# Patient Record
Sex: Female | Born: 1940
Health system: Southern US, Community
[De-identification: ages and names within clinical notes are randomized; demographics above are authoritative.]

## PROBLEM LIST (undated history)

## (undated) DIAGNOSIS — M542 Cervicalgia: Secondary | ICD-10-CM

## (undated) DIAGNOSIS — I10 Essential (primary) hypertension: Secondary | ICD-10-CM

## (undated) DIAGNOSIS — M159 Polyosteoarthritis, unspecified: Secondary | ICD-10-CM

## (undated) DIAGNOSIS — R251 Tremor, unspecified: Secondary | ICD-10-CM

## (undated) DIAGNOSIS — E785 Hyperlipidemia, unspecified: Secondary | ICD-10-CM

## (undated) DIAGNOSIS — K219 Gastro-esophageal reflux disease without esophagitis: Secondary | ICD-10-CM

## (undated) DIAGNOSIS — R51 Headache: Secondary | ICD-10-CM

## (undated) DIAGNOSIS — F419 Anxiety disorder, unspecified: Secondary | ICD-10-CM

## (undated) DIAGNOSIS — K5790 Diverticulosis of intestine, part unspecified, without perforation or abscess without bleeding: Secondary | ICD-10-CM

## (undated) DIAGNOSIS — R519 Headache, unspecified: Secondary | ICD-10-CM

## (undated) HISTORY — PX: OTHER SURGICAL HISTORY: SHX169

## (undated) HISTORY — PX: KNEE ARTHROSCOPY: SUR90

---

## 2004-12-29 ENCOUNTER — Ambulatory Visit: Payer: Self-pay | Admitting: Internal Medicine

## 2005-02-01 ENCOUNTER — Ambulatory Visit: Payer: Self-pay | Admitting: Internal Medicine

## 2005-10-11 ENCOUNTER — Ambulatory Visit: Payer: Self-pay | Admitting: Gastroenterology

## 2006-02-12 ENCOUNTER — Ambulatory Visit: Payer: Self-pay | Admitting: Internal Medicine

## 2007-02-19 ENCOUNTER — Ambulatory Visit: Payer: Self-pay | Admitting: Internal Medicine

## 2007-11-04 ENCOUNTER — Ambulatory Visit: Payer: Self-pay

## 2007-11-10 ENCOUNTER — Other Ambulatory Visit: Payer: Self-pay

## 2007-11-10 ENCOUNTER — Ambulatory Visit: Payer: Self-pay | Admitting: Unknown Physician Specialty

## 2007-11-11 ENCOUNTER — Ambulatory Visit: Payer: Self-pay | Admitting: Unknown Physician Specialty

## 2007-12-08 ENCOUNTER — Ambulatory Visit: Payer: Self-pay | Admitting: Cardiology

## 2008-04-11 ENCOUNTER — Other Ambulatory Visit: Payer: Self-pay

## 2008-04-11 ENCOUNTER — Emergency Department: Payer: Self-pay | Admitting: Emergency Medicine

## 2009-02-24 ENCOUNTER — Ambulatory Visit: Payer: Self-pay | Admitting: Internal Medicine

## 2009-06-17 ENCOUNTER — Inpatient Hospital Stay: Payer: Self-pay | Admitting: Internal Medicine

## 2010-03-27 ENCOUNTER — Ambulatory Visit: Payer: Self-pay | Admitting: Internal Medicine

## 2010-07-02 ENCOUNTER — Inpatient Hospital Stay: Payer: Self-pay | Admitting: Internal Medicine

## 2010-11-14 ENCOUNTER — Ambulatory Visit: Payer: Self-pay | Admitting: Internal Medicine

## 2011-01-09 ENCOUNTER — Ambulatory Visit: Payer: Self-pay | Admitting: Gastroenterology

## 2011-01-11 LAB — PATHOLOGY REPORT

## 2011-01-17 ENCOUNTER — Ambulatory Visit: Payer: Self-pay | Admitting: Gastroenterology

## 2011-11-20 ENCOUNTER — Ambulatory Visit: Payer: Self-pay | Admitting: Internal Medicine

## 2012-11-26 ENCOUNTER — Ambulatory Visit: Payer: Self-pay | Admitting: Internal Medicine

## 2013-03-06 ENCOUNTER — Emergency Department: Payer: Self-pay | Admitting: Emergency Medicine

## 2013-03-06 LAB — URINALYSIS, COMPLETE
Bilirubin,UR: NEGATIVE
Glucose,UR: NEGATIVE mg/dL (ref 0–75)
Ketone: NEGATIVE
Ph: 7 (ref 4.5–8.0)
Protein: NEGATIVE
RBC,UR: 4 /HPF (ref 0–5)
Specific Gravity: 1.01 (ref 1.003–1.030)
Squamous Epithelial: 19
WBC UR: 144 /HPF (ref 0–5)

## 2013-03-06 LAB — COMPREHENSIVE METABOLIC PANEL
Anion Gap: 9 (ref 7–16)
BUN: 14 mg/dL (ref 7–18)
Bilirubin,Total: 0.8 mg/dL (ref 0.2–1.0)
Calcium, Total: 9.4 mg/dL (ref 8.5–10.1)
Chloride: 106 mmol/L (ref 98–107)
Co2: 22 mmol/L (ref 21–32)
EGFR (African American): >60
EGFR (Non-African Amer.): 56 — ABNORMAL LOW
Glucose: 111 mg/dL — ABNORMAL HIGH (ref 65–99)
Osmolality: 275 (ref 275–301)
SGOT(AST): 38 U/L — ABNORMAL HIGH (ref 15–37)
Sodium: 137 mmol/L (ref 136–145)
Total Protein: 7.5 g/dL (ref 6.4–8.2)

## 2013-03-06 LAB — CBC
MCHC: 35 g/dL (ref 32.0–36.0)
RDW: 13 % (ref 11.5–14.5)
WBC: 7.9 10*3/uL (ref 3.6–11.0)

## 2013-03-06 LAB — LIPASE, BLOOD: Lipase: 183 U/L (ref 73–393)

## 2013-03-07 ENCOUNTER — Emergency Department: Payer: Self-pay | Admitting: Internal Medicine

## 2013-03-07 LAB — CBC
HCT: 45.2 % (ref 35.0–47.0)
MCHC: 34.9 g/dL (ref 32.0–36.0)
MCV: 90 fL (ref 80–100)
RBC: 5.04 10*6/uL (ref 3.80–5.20)
WBC: 7.5 10*3/uL (ref 3.6–11.0)

## 2013-03-07 LAB — URINALYSIS, COMPLETE
Bilirubin,UR: NEGATIVE
Glucose,UR: NEGATIVE mg/dL (ref 0–75)
Hyaline Cast: 4
Ketone: NEGATIVE
Protein: 30
RBC,UR: 9 /HPF (ref 0–5)
Specific Gravity: 1.019 (ref 1.003–1.030)
Squamous Epithelial: 6
WBC UR: 860 /HPF (ref 0–5)

## 2013-03-07 LAB — COMPREHENSIVE METABOLIC PANEL
Alkaline Phosphatase: 67 U/L (ref 50–136)
Anion Gap: 8 (ref 7–16)
BUN: 15 mg/dL (ref 7–18)
Bilirubin,Total: 0.9 mg/dL (ref 0.2–1.0)
Chloride: 104 mmol/L (ref 98–107)
Co2: 24 mmol/L (ref 21–32)
Creatinine: 1.17 mg/dL (ref 0.60–1.30)
EGFR (African American): 55 — ABNORMAL LOW
Potassium: 3.7 mmol/L (ref 3.5–5.1)

## 2013-03-07 LAB — TROPONIN I: Troponin-I: <0.02 ng/mL

## 2013-03-07 LAB — LIPASE, BLOOD: Lipase: 216 U/L (ref 73–393)

## 2013-09-16 ENCOUNTER — Ambulatory Visit: Payer: Self-pay | Admitting: Internal Medicine

## 2013-09-22 ENCOUNTER — Ambulatory Visit: Payer: Self-pay | Admitting: Cardiology

## 2014-03-11 ENCOUNTER — Ambulatory Visit: Payer: Self-pay | Admitting: Internal Medicine

## 2015-03-23 ENCOUNTER — Ambulatory Visit: Payer: Self-pay | Admitting: Internal Medicine

## 2015-03-23 DIAGNOSIS — Z1231 Encounter for screening mammogram for malignant neoplasm of breast: Secondary | ICD-10-CM | POA: Diagnosis not present

## 2015-04-14 DIAGNOSIS — R739 Hyperglycemia, unspecified: Secondary | ICD-10-CM | POA: Diagnosis not present

## 2015-04-14 DIAGNOSIS — M159 Polyosteoarthritis, unspecified: Secondary | ICD-10-CM | POA: Diagnosis not present

## 2015-04-14 DIAGNOSIS — I10 Essential (primary) hypertension: Secondary | ICD-10-CM | POA: Diagnosis not present

## 2015-04-14 DIAGNOSIS — E782 Mixed hyperlipidemia: Secondary | ICD-10-CM | POA: Diagnosis not present

## 2015-04-21 DIAGNOSIS — R739 Hyperglycemia, unspecified: Secondary | ICD-10-CM | POA: Diagnosis not present

## 2015-04-21 DIAGNOSIS — E039 Hypothyroidism, unspecified: Secondary | ICD-10-CM | POA: Diagnosis present

## 2015-04-21 DIAGNOSIS — E782 Mixed hyperlipidemia: Secondary | ICD-10-CM | POA: Diagnosis not present

## 2015-04-21 DIAGNOSIS — J984 Other disorders of lung: Secondary | ICD-10-CM | POA: Diagnosis not present

## 2015-04-21 DIAGNOSIS — R0609 Other forms of dyspnea: Secondary | ICD-10-CM | POA: Diagnosis not present

## 2015-04-21 DIAGNOSIS — Z0000 Encounter for general adult medical examination without abnormal findings: Secondary | ICD-10-CM | POA: Diagnosis not present

## 2015-06-21 DIAGNOSIS — M659 Synovitis and tenosynovitis, unspecified: Secondary | ICD-10-CM | POA: Diagnosis not present

## 2015-06-21 DIAGNOSIS — E039 Hypothyroidism, unspecified: Secondary | ICD-10-CM | POA: Diagnosis not present

## 2015-06-21 DIAGNOSIS — M5010 Cervical disc disorder with radiculopathy, unspecified cervical region: Secondary | ICD-10-CM | POA: Diagnosis not present

## 2015-08-31 DIAGNOSIS — E039 Hypothyroidism, unspecified: Secondary | ICD-10-CM | POA: Diagnosis not present

## 2015-08-31 DIAGNOSIS — R1084 Generalized abdominal pain: Secondary | ICD-10-CM | POA: Diagnosis not present

## 2015-09-21 DIAGNOSIS — G250 Essential tremor: Secondary | ICD-10-CM | POA: Diagnosis not present

## 2015-09-21 DIAGNOSIS — R55 Syncope and collapse: Secondary | ICD-10-CM | POA: Diagnosis not present

## 2015-09-26 DIAGNOSIS — R55 Syncope and collapse: Secondary | ICD-10-CM | POA: Diagnosis not present

## 2015-10-25 DIAGNOSIS — I10 Essential (primary) hypertension: Secondary | ICD-10-CM | POA: Diagnosis not present

## 2015-10-25 DIAGNOSIS — R739 Hyperglycemia, unspecified: Secondary | ICD-10-CM | POA: Diagnosis not present

## 2016-01-11 DIAGNOSIS — I10 Essential (primary) hypertension: Secondary | ICD-10-CM | POA: Diagnosis not present

## 2016-01-11 DIAGNOSIS — Z0100 Encounter for examination of eyes and vision without abnormal findings: Secondary | ICD-10-CM | POA: Diagnosis not present

## 2016-01-24 DIAGNOSIS — R739 Hyperglycemia, unspecified: Secondary | ICD-10-CM | POA: Diagnosis not present

## 2016-01-24 DIAGNOSIS — L820 Inflamed seborrheic keratosis: Secondary | ICD-10-CM | POA: Diagnosis not present

## 2016-01-24 DIAGNOSIS — N644 Mastodynia: Secondary | ICD-10-CM | POA: Diagnosis not present

## 2016-01-24 DIAGNOSIS — E039 Hypothyroidism, unspecified: Secondary | ICD-10-CM | POA: Diagnosis not present

## 2016-01-25 ENCOUNTER — Other Ambulatory Visit: Payer: Self-pay | Admitting: Internal Medicine

## 2016-01-25 DIAGNOSIS — N644 Mastodynia: Secondary | ICD-10-CM

## 2016-01-27 DIAGNOSIS — K219 Gastro-esophageal reflux disease without esophagitis: Secondary | ICD-10-CM | POA: Diagnosis not present

## 2016-01-27 DIAGNOSIS — R140 Abdominal distension (gaseous): Secondary | ICD-10-CM | POA: Diagnosis not present

## 2016-01-27 DIAGNOSIS — R159 Full incontinence of feces: Secondary | ICD-10-CM | POA: Diagnosis not present

## 2016-01-27 DIAGNOSIS — Z86010 Personal history of colonic polyps: Secondary | ICD-10-CM | POA: Diagnosis not present

## 2016-02-08 ENCOUNTER — Other Ambulatory Visit: Payer: Self-pay | Admitting: Internal Medicine

## 2016-02-08 ENCOUNTER — Ambulatory Visit
Admission: RE | Admit: 2016-02-08 | Discharge: 2016-02-08 | Disposition: A | Discharge status: Home / Self Care | Payer: Commercial Managed Care - HMO | Source: Ambulatory Visit | Attending: Internal Medicine | Admitting: Internal Medicine

## 2016-02-08 DIAGNOSIS — N644 Mastodynia: Secondary | ICD-10-CM

## 2016-03-09 ENCOUNTER — Encounter: Payer: Self-pay | Admitting: *Deleted

## 2016-03-12 ENCOUNTER — Encounter
Admission: RE | Disposition: A | Discharge status: Home / Self Care | Payer: Self-pay | Source: Ambulatory Visit | Attending: Gastroenterology

## 2016-03-12 ENCOUNTER — Ambulatory Visit: Payer: Commercial Managed Care - HMO | Admitting: Anesthesiology

## 2016-03-12 ENCOUNTER — Encounter: Payer: Self-pay | Admitting: *Deleted

## 2016-03-12 ENCOUNTER — Ambulatory Visit
Admission: RE | Admit: 2016-03-12 | Discharge: 2016-03-12 | Disposition: A | Discharge status: Home / Self Care | Payer: Commercial Managed Care - HMO | Source: Ambulatory Visit | Attending: Gastroenterology | Admitting: Gastroenterology

## 2016-03-12 DIAGNOSIS — K5790 Diverticulosis of intestine, part unspecified, without perforation or abscess without bleeding: Secondary | ICD-10-CM | POA: Diagnosis not present

## 2016-03-12 DIAGNOSIS — I10 Essential (primary) hypertension: Secondary | ICD-10-CM | POA: Diagnosis not present

## 2016-03-12 DIAGNOSIS — E785 Hyperlipidemia, unspecified: Secondary | ICD-10-CM | POA: Diagnosis not present

## 2016-03-12 DIAGNOSIS — K5730 Diverticulosis of large intestine without perforation or abscess without bleeding: Secondary | ICD-10-CM | POA: Insufficient documentation

## 2016-03-12 DIAGNOSIS — Z888 Allergy status to other drugs, medicaments and biological substances status: Secondary | ICD-10-CM | POA: Diagnosis not present

## 2016-03-12 DIAGNOSIS — Z79899 Other long term (current) drug therapy: Secondary | ICD-10-CM | POA: Diagnosis not present

## 2016-03-12 DIAGNOSIS — Z86010 Personal history of colonic polyps: Secondary | ICD-10-CM | POA: Insufficient documentation

## 2016-03-12 DIAGNOSIS — Z6836 Body mass index (BMI) 36.0-36.9, adult: Secondary | ICD-10-CM | POA: Insufficient documentation

## 2016-03-12 DIAGNOSIS — Z1211 Encounter for screening for malignant neoplasm of colon: Secondary | ICD-10-CM | POA: Diagnosis not present

## 2016-03-12 DIAGNOSIS — M159 Polyosteoarthritis, unspecified: Secondary | ICD-10-CM | POA: Diagnosis not present

## 2016-03-12 DIAGNOSIS — Z7982 Long term (current) use of aspirin: Secondary | ICD-10-CM | POA: Diagnosis not present

## 2016-03-12 HISTORY — DX: Headache, unspecified: R51.9

## 2016-03-12 HISTORY — DX: Essential (primary) hypertension: I10

## 2016-03-12 HISTORY — PX: COLONOSCOPY WITH PROPOFOL: SHX5780

## 2016-03-12 HISTORY — DX: Polyosteoarthritis, unspecified: M15.9

## 2016-03-12 HISTORY — DX: Headache: R51

## 2016-03-12 HISTORY — DX: Cervicalgia: M54.2

## 2016-03-12 HISTORY — DX: Hyperlipidemia, unspecified: E78.5

## 2016-03-12 SURGERY — COLONOSCOPY WITH PROPOFOL
Anesthesia: General

## 2016-03-12 MED ORDER — SODIUM CHLORIDE 0.9 % IV SOLN
INTRAVENOUS | Status: DC
  Administered 2016-03-12 (×2): via INTRAVENOUS

## 2016-03-12 MED ORDER — PROPOFOL 10 MG/ML IV BOLUS
INTRAVENOUS | Status: DC | PRN
  Administered 2016-03-12: 80 mg via INTRAVENOUS
  Administered 2016-03-12: 40 mg via INTRAVENOUS

## 2016-03-12 MED ORDER — PHENYLEPHRINE HCL 10 MG/ML IJ SOLN
INTRAMUSCULAR | Status: DC | PRN
  Administered 2016-03-12 (×2): 100 ug via INTRAVENOUS

## 2016-03-12 MED ORDER — SODIUM CHLORIDE 0.9 % IV SOLN: INTRAVENOUS | Status: DC

## 2016-03-12 MED ORDER — PROPOFOL 500 MG/50ML IV EMUL
INTRAVENOUS | Status: DC | PRN
  Administered 2016-03-12: 120 ug/kg/min via INTRAVENOUS

## 2016-03-12 MED ORDER — MIDAZOLAM HCL 2 MG/2ML IJ SOLN
INTRAMUSCULAR | Status: DC | PRN
  Administered 2016-03-12: 1 mg via INTRAVENOUS

## 2016-03-12 NOTE — Op Note (Addendum)
[facility] Gastroenterology Patient Name: Erika Nelson Procedure Date: 03/12/2016 8:52 AM MRN: [id number] Account #: [id number] Date of Birth: Dec 26, 1941 Admit Type: Outpatient Age: 75 Room: [facility] ENDO ROOM 3 Gender: Female Note Status: Finalized Procedure:            Colonoscopy Indications:          Personal history of colonic polyps Providers:            Lollie Sails, MD Referring MD:         Rusty Aus, MD (Referring MD) Medicines:            Monitored Anesthesia Care Complications:        No immediate complications. Procedure:            Pre-Anesthesia Assessment:                       - ASA Grade Assessment: III - A patient with severe                        systemic disease.                       After obtaining informed consent, the colonoscope was                        passed under direct vision. Throughout the procedure,                        the patient's blood pressure, pulse, and oxygen                        saturations were monitored continuously. The                        Colonoscope was introduced through the anus and                        advanced to the the cecum, identified by appendiceal                        orifice and ileocecal valve. The colonoscopy was                        performed without difficulty. The patient tolerated the                        procedure well. The quality of the bowel preparation                        was good. Findings:      A few small-mouthed diverticula were found in the sigmoid colon.      The retroflexed view of the distal rectum and anal verge was normal and       showed no anal or rectal abnormalities.      The digital rectal exam was normal. Impression:           - Diverticulosis in the sigmoid colon.                       - The distal rectum and anal verge are normal on  retroflexion view.                       - No specimens collected. Recommendation:       -  Repeat colonoscopy in 5 years for surveillance. Procedure Code(s):    --- Professional ---                       [phone], Colonoscopy, flexible; diagnostic, including                        collection of specimen(s) by brushing or washing, when                        performed (separate procedure) Diagnosis Code(s):    --- Professional ---                       Z86.010, Personal history of colonic polyps                       K57.30, Diverticulosis of large intestine without                        perforation or abscess without bleeding CPT copyright 2016 American Medical Association. All rights reserved. The codes documented in this report are preliminary and upon coder review may  be revised to meet current compliance requirements. Lollie Sails, MD 03/12/2016 9:32:12 AM This report has been signed electronically. Number of Addenda: 0 Note Initiated On: 03/12/2016 8:52 AM Scope Withdrawal Time: 0 hours 7 minutes 55 seconds  Total Procedure Duration: 0 hours 19 minutes 16 seconds       [facility]

## 2016-03-12 NOTE — Anesthesia Preprocedure Evaluation (Signed)
Anesthesia Evaluation  Patient identified by MRN, date of birth, ID band Patient awake    Reviewed: Allergy & Precautions, NPO status , Patient's Chart, lab work & pertinent test results  Airway Mallampati: II       Dental  (+) Teeth Intact   Pulmonary     + decreased breath sounds      Cardiovascular Exercise Tolerance: Good hypertension, Pt. on medications  Rhythm:Regular     Neuro/Psych    GI/Hepatic negative GI ROS, Neg liver ROS,   Endo/Other  Morbid obesity  Renal/GU negative Renal ROS     Musculoskeletal   Abdominal (+) + obese,   Peds  Hematology negative hematology ROS (+)   Anesthesia Other Findings   Reproductive/Obstetrics                             Anesthesia Physical Anesthesia Plan  ASA: III  Anesthesia Plan: General   Post-op Pain Management:    Induction: Intravenous  Airway Management Planned: Natural Airway and Nasal Cannula  Additional Equipment:   Intra-op Plan:   Post-operative Plan:   Informed Consent: I have reviewed the patients History and Physical, chart, labs and discussed the procedure including the risks, benefits and alternatives for the proposed anesthesia with the patient or authorized representative who has indicated his/her understanding and acceptance.     Plan Discussed with: CRNA  Anesthesia Plan Comments:         Anesthesia Quick Evaluation

## 2016-03-12 NOTE — Anesthesia Postprocedure Evaluation (Signed)
Anesthesia Post Note  Patient: Erika Nelson  Procedure(s) Performed: Procedure(s) (LRB): COLONOSCOPY WITH PROPOFOL (N/A)  Patient location during evaluation: PACU Anesthesia Type: General Level of consciousness: awake Pain management: pain level controlled Vital Signs Assessment: post-procedure vital signs reviewed and stable Respiratory status: spontaneous breathing Cardiovascular status: blood pressure returned to baseline Anesthetic complications: no    Last Vitals:  Filed Vitals:   03/12/16 0941 03/12/16 0950  BP: 97/63 110/59  Pulse: 86 73  Temp: 36.3 C   Resp: 11 16    Last Pain: There were no vitals filed for this visit.               VAN STAVEREN,[name]

## 2016-03-12 NOTE — H&P (Signed)
Outpatient short stay form Pre-procedure 03/12/2016 8:56 AM Erika Sails MD  Primary Physician: Dr. Emily Filbert  Reason for visit:  Colonoscopy  History of present illness:  Patient is a 75 year old female presenting today for colonoscopy. She has a personal history of adenomatous colon polyps. Her last colonoscopy was in 2012. She tolerated her prep well. She does take a daily 81 mg aspirin tablet his health that for several days. She takes no other aspirin products or blood thinning agents.    Current facility-administered medications:  .  0.9 %  sodium chloride infusion, , Intravenous, Continuous, Erika Sails, MD, Last Rate: 20 mL/hr at 03/12/16 0808 .  0.9 %  sodium chloride infusion, , Intravenous, Continuous, Erika Sails, MD  Prescriptions prior to admission  Medication Sig Dispense Refill Last Dose  . ALPRAZolam (XANAX) 0.25 MG tablet Take 0.25 mg by mouth 2 (two) times daily.     Marland Kitchen aspirin EC 81 MG tablet Take 81 mg by mouth daily.   Past Week at Unknown time  . esomeprazole (NEXIUM) 40 MG capsule Take 40 mg by mouth daily at 12 noon.     Marland Kitchen levothyroxine (SYNTHROID, LEVOTHROID) 75 MCG tablet Take 75 mcg by mouth daily before breakfast.     . meloxicam (MOBIC) 7.5 MG tablet Take 7.5 mg by mouth daily.     Marland Kitchen nystatin cream (MYCOSTATIN) Apply 1 application topically 2 (two) times daily.     . propranolol (INDERAL) 10 MG tablet Take 10 mg by mouth every morning. Before breakfast     . traMADol (ULTRAM) 50 MG tablet Take by mouth every 6 (six) hours as needed.     . triamcinolone cream (KENALOG) 0.1 % Apply 1 application topically 2 (two) times daily.     Marland Kitchen triamterene-hydrochlorothiazide (MAXZIDE) 75-50 MG tablet Take 0.5 tablets by mouth 2 (two) times a week.        Allergies  Allergen Reactions  . Norco [Hydrocodone-Acetaminophen]   . Prozac [Fluoxetine Hcl]      Past Medical History  Diagnosis Date  . Hypertension   . Cervicalgia   . Headache   .  Hyperlipidemia   . Generalized OA     Osteoarthritis bilateral knees    Review of systems:      Physical Exam    Heart and lungs: Regular rate and rhythm without rub or gallop, lungs are bilaterally clear.    HEENT: Normal cephalic atraumatic eyes are anicteric    Other:     Pertinant exam for procedure: Soft nontender nondistended bowel sounds positive normoactive.    Planned proceedures: Colonoscopy and indicated procedures.    Erika Sails, MD Gastroenterology 03/12/2016  8:56 AM

## 2016-03-12 NOTE — Transfer of Care (Signed)
Immediate Anesthesia Transfer of Care Note  Patient: Erika Nelson  Procedure(s) Performed: Procedure(s): COLONOSCOPY WITH PROPOFOL (N/A)  Patient Location: PACU  Anesthesia Type:General  Level of Consciousness: awake  Airway & Oxygen Therapy: Patient Spontanous Breathing and Patient connected to nasal cannula oxygen  Post-op Assessment: Report given to RN and Post -op Vital signs reviewed and stable  Post vital signs: Reviewed and stable  Last Vitals:  Filed Vitals:   03/12/16 0751  BP: 147/83  Pulse: 104  Temp: 36.3 C  Resp: 18    Complications: No apparent anesthesia complications

## 2016-03-13 ENCOUNTER — Encounter: Payer: Self-pay | Admitting: Gastroenterology

## 2016-04-16 DIAGNOSIS — E039 Hypothyroidism, unspecified: Secondary | ICD-10-CM | POA: Diagnosis not present

## 2016-04-16 DIAGNOSIS — R739 Hyperglycemia, unspecified: Secondary | ICD-10-CM | POA: Diagnosis not present

## 2016-04-24 DIAGNOSIS — R739 Hyperglycemia, unspecified: Secondary | ICD-10-CM | POA: Diagnosis not present

## 2016-04-24 DIAGNOSIS — G5603 Carpal tunnel syndrome, bilateral upper limbs: Secondary | ICD-10-CM | POA: Diagnosis not present

## 2016-04-24 DIAGNOSIS — Z0000 Encounter for general adult medical examination without abnormal findings: Secondary | ICD-10-CM | POA: Diagnosis not present

## 2016-05-15 ENCOUNTER — Other Ambulatory Visit: Payer: Self-pay | Admitting: Internal Medicine

## 2016-05-15 DIAGNOSIS — M170 Bilateral primary osteoarthritis of knee: Secondary | ICD-10-CM | POA: Diagnosis not present

## 2016-05-15 DIAGNOSIS — R1084 Generalized abdominal pain: Secondary | ICD-10-CM | POA: Diagnosis not present

## 2016-05-22 ENCOUNTER — Ambulatory Visit
Admission: RE | Admit: 2016-05-22 | Discharge: 2016-05-22 | Disposition: A | Discharge status: Home / Self Care | Payer: Commercial Managed Care - HMO | Source: Ambulatory Visit | Attending: Internal Medicine | Admitting: Internal Medicine

## 2016-05-22 DIAGNOSIS — R1084 Generalized abdominal pain: Secondary | ICD-10-CM

## 2016-05-22 DIAGNOSIS — K5900 Constipation, unspecified: Secondary | ICD-10-CM | POA: Diagnosis not present

## 2016-05-24 DIAGNOSIS — M25561 Pain in right knee: Secondary | ICD-10-CM | POA: Diagnosis not present

## 2016-05-24 DIAGNOSIS — M170 Bilateral primary osteoarthritis of knee: Secondary | ICD-10-CM | POA: Diagnosis not present

## 2016-05-24 DIAGNOSIS — M25562 Pain in left knee: Secondary | ICD-10-CM | POA: Diagnosis not present

## 2016-05-30 ENCOUNTER — Other Ambulatory Visit: Payer: Self-pay | Admitting: Orthopedic Surgery

## 2016-05-30 DIAGNOSIS — M1712 Unilateral primary osteoarthritis, left knee: Secondary | ICD-10-CM

## 2016-05-30 DIAGNOSIS — M1711 Unilateral primary osteoarthritis, right knee: Secondary | ICD-10-CM

## 2016-06-05 DIAGNOSIS — R140 Abdominal distension (gaseous): Secondary | ICD-10-CM | POA: Diagnosis not present

## 2016-06-05 DIAGNOSIS — R1031 Right lower quadrant pain: Secondary | ICD-10-CM | POA: Diagnosis not present

## 2016-06-05 DIAGNOSIS — R1013 Epigastric pain: Secondary | ICD-10-CM | POA: Diagnosis not present

## 2016-06-05 DIAGNOSIS — K219 Gastro-esophageal reflux disease without esophagitis: Secondary | ICD-10-CM | POA: Diagnosis not present

## 2016-06-05 DIAGNOSIS — R1032 Left lower quadrant pain: Secondary | ICD-10-CM | POA: Diagnosis not present

## 2016-06-05 DIAGNOSIS — R194 Change in bowel habit: Secondary | ICD-10-CM | POA: Diagnosis not present

## 2016-06-06 DIAGNOSIS — R140 Abdominal distension (gaseous): Secondary | ICD-10-CM | POA: Diagnosis not present

## 2016-06-06 DIAGNOSIS — R1032 Left lower quadrant pain: Secondary | ICD-10-CM | POA: Diagnosis not present

## 2016-06-06 DIAGNOSIS — R1013 Epigastric pain: Secondary | ICD-10-CM | POA: Diagnosis not present

## 2016-06-06 DIAGNOSIS — K219 Gastro-esophageal reflux disease without esophagitis: Secondary | ICD-10-CM | POA: Diagnosis not present

## 2016-06-06 DIAGNOSIS — R194 Change in bowel habit: Secondary | ICD-10-CM | POA: Diagnosis not present

## 2016-06-06 DIAGNOSIS — R1031 Right lower quadrant pain: Secondary | ICD-10-CM | POA: Diagnosis not present

## 2016-06-11 ENCOUNTER — Ambulatory Visit
Admission: RE | Admit: 2016-06-11 | Discharge: 2016-06-11 | Disposition: A | Discharge status: Home / Self Care | Payer: Commercial Managed Care - HMO | Source: Ambulatory Visit | Attending: Orthopedic Surgery | Admitting: Orthopedic Surgery

## 2016-06-11 DIAGNOSIS — M1711 Unilateral primary osteoarthritis, right knee: Secondary | ICD-10-CM | POA: Insufficient documentation

## 2016-06-11 DIAGNOSIS — M1712 Unilateral primary osteoarthritis, left knee: Secondary | ICD-10-CM

## 2016-06-11 DIAGNOSIS — M179 Osteoarthritis of knee, unspecified: Secondary | ICD-10-CM | POA: Diagnosis not present

## 2016-07-04 DIAGNOSIS — M1711 Unilateral primary osteoarthritis, right knee: Secondary | ICD-10-CM | POA: Diagnosis not present

## 2016-07-09 ENCOUNTER — Encounter
Admission: RE | Admit: 2016-07-09 | Discharge: 2016-07-09 | Disposition: A | Discharge status: Home / Self Care | Payer: Commercial Managed Care - HMO | Source: Ambulatory Visit | Attending: Orthopedic Surgery | Admitting: Orthopedic Surgery

## 2016-07-09 DIAGNOSIS — I10 Essential (primary) hypertension: Secondary | ICD-10-CM | POA: Diagnosis not present

## 2016-07-09 DIAGNOSIS — Z01812 Encounter for preprocedural laboratory examination: Secondary | ICD-10-CM | POA: Insufficient documentation

## 2016-07-09 HISTORY — DX: Tremor, unspecified: R25.1

## 2016-07-09 HISTORY — DX: Anxiety disorder, unspecified: F41.9

## 2016-07-09 HISTORY — DX: Diverticulosis of intestine, part unspecified, without perforation or abscess without bleeding: K57.90

## 2016-07-09 HISTORY — DX: Gastro-esophageal reflux disease without esophagitis: K21.9

## 2016-07-09 LAB — URINALYSIS COMPLETE WITH MICROSCOPIC (ARMC ONLY)
BILIRUBIN URINE: NEGATIVE
GLUCOSE, UA: NEGATIVE mg/dL
Hgb urine dipstick: NEGATIVE
KETONES UR: NEGATIVE mg/dL
Nitrite: NEGATIVE
PH: 5 (ref 5.0–8.0)
Protein, ur: NEGATIVE mg/dL
Specific Gravity, Urine: 1.019 (ref 1.005–1.030)

## 2016-07-09 LAB — CBC
HCT: 44.3 % (ref 35.0–47.0)
Hemoglobin: 15.6 g/dL (ref 12.0–16.0)
MCH: 30.6 pg (ref 26.0–34.0)
MCHC: 35.3 g/dL (ref 32.0–36.0)
MCV: 86.6 fL (ref 80.0–100.0)
PLATELETS: 186 10*3/uL (ref 150–440)
RBC: 5.11 MIL/uL (ref 3.80–5.20)
RDW: 13 % (ref 11.5–14.5)
WBC: 7.6 10*3/uL (ref 3.6–11.0)

## 2016-07-09 LAB — BASIC METABOLIC PANEL
ANION GAP: 8 (ref 5–15)
BUN: 21 mg/dL — AB (ref 6–20)
CALCIUM: 9.7 mg/dL (ref 8.9–10.3)
CO2: 26 mmol/L (ref 22–32)
CREATININE: 0.94 mg/dL (ref 0.44–1.00)
Chloride: 102 mmol/L (ref 101–111)
GFR calc Af Amer: >60 mL/min (ref >60)
GFR, EST NON AFRICAN AMERICAN: 58 mL/min — AB (ref >60)
GLUCOSE: 117 mg/dL — AB (ref 65–99)
Potassium: 4.4 mmol/L (ref 3.5–5.1)
Sodium: 136 mmol/L (ref 135–145)

## 2016-07-09 LAB — SEDIMENTATION RATE: SED RATE: 11 mm/h (ref 0–30)

## 2016-07-09 LAB — PROTIME-INR
INR: 1.1
Prothrombin Time: 14.4 seconds (ref 11.4–15.0)

## 2016-07-09 LAB — SURGICAL PCR SCREEN
MRSA, PCR: NEGATIVE
Staphylococcus aureus: POSITIVE — AB

## 2016-07-09 LAB — APTT: APTT: 28 s (ref 24–36)

## 2016-07-09 LAB — TYPE AND SCREEN
ABO/RH(D): A POS
ANTIBODY SCREEN: NEGATIVE

## 2016-07-09 NOTE — Patient Instructions (Signed)
  Your procedure is scheduled on: July 24, 2016 (Tuesday) Report to Same Day Surgery 2nd floor Medical Mall To find out your arrival time please call [phone] between 1PM - 3PM on July 23, 2016 (Monday)  Remember: Instructions that are not followed completely may result in serious medical risk, up to and including death, or upon the discretion of your surgeon and anesthesiologist your surgery may need to be rescheduled.    _x___ 1. Do not eat food or drink liquids after midnight. No gum chewing or hard candies.     __x__ 2. No Alcohol for 24 hours before or after surgery.   __x__3. No Smoking for 24 prior to surgery.   ____  4. Bring all medications with you on the day of surgery if instructed.    __x__ 5. Notify your doctor if there is any change in your medical condition     (cold, fever, infections).     Do not wear jewelry, make-up, hairpins, clips or nail polish.  Do not wear lotions, powders, or perfumes. You may wear deodorant.  Do not shave 48 hours prior to surgery. Men may shave face and neck.  Do not bring valuables to the hospital.    [facility] is not responsible for any belongings or valuables.               Contacts, dentures or bridgework may not be worn into surgery.  Leave your suitcase in the car. After surgery it may be brought to your room.  For patients admitted to the hospital, discharge time is determined by your treatment team.   Patients discharged the day of surgery will not be allowed to drive home.    Please read over the following fact sheets that you were given:   [facility] Preparing for Surgery and or MRSA Information   _x___ Take these medicines the morning of surgery with A SIP OF WATER:    1. Propranolol  2. Levothyroxine  3.  4.  5.  6.  ____ Fleet Enema (as directed)   _x___ Use CHG Soap or sage wipes as directed on instruction sheet   ____ Use inhalers on the day of surgery and bring to hospital day of surgery  ____ Stop  metformin 2 days prior to surgery    ____ Take 1/2 of usual insulin dose the night before surgery and none on the morning of  surgery         .   _x___ Stop aspirin or coumadin, or plavix (Stop Aspirin one week before surgery)  _x__ Stop Anti-inflammatories such as Advil, Aleve, Ibuprofen, Motrin, Naproxen,          Naprosyn, Goodies powders or aspirin products.(Stop Mobic one week before surgery)   Ok to take Tylenol.   ____ Stop supplements until after surgery.    ____ Bring C-Pap to the hospital.

## 2016-07-10 ENCOUNTER — Inpatient Hospital Stay: Admission: RE | Admit: 2016-07-10 | Payer: Self-pay | Source: Ambulatory Visit

## 2016-07-10 LAB — URINE CULTURE

## 2016-07-10 NOTE — Pre-Procedure Instructions (Signed)
Positive staph results faxed and called to Dr. Rudene Christians office (spoke to [place]).

## 2016-07-13 IMAGING — CT CT ABD-PELV W/O CM
2 of 4 series · 17 of 46 positions shown, 19 images · non-contrast
Comparison: 03/07/2013

CLINICAL DATA: Chronic umbilical pain and swelling since 4222 with
intermittent left lower quadrant and right lower quadrant pain,
alternating diarrhea and constipation

EXAM:
CT ABDOMEN AND PELVIS WITHOUT CONTRAST
TECHNIQUE: Multidetector CT imaging of the abdomen and pelvis was performed
following the standard protocol without IV contrast.

[Series 2: routine abd pel wo · axial · 0.83mm/px · z∈[-240,+160]mm · 14 of 88 slices shown, 16 images]
[im 4/88  soft-tissue]
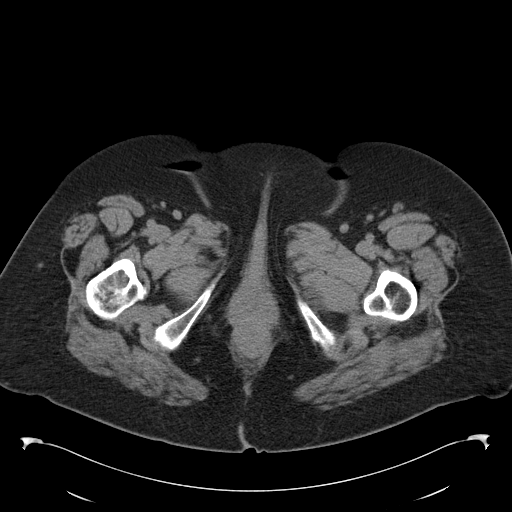
[im 4/88  bone]
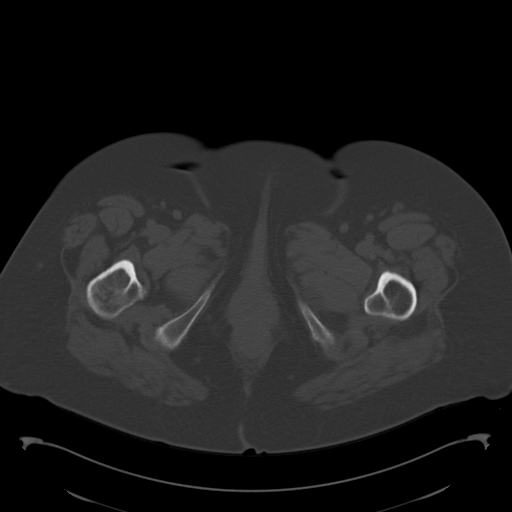
[im 11/88  soft-tissue]
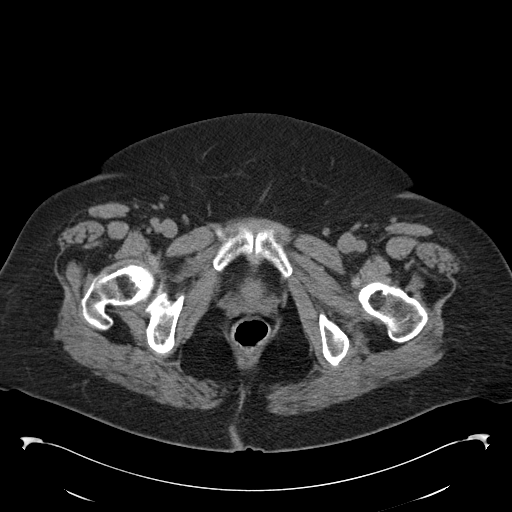
[im 19/88  soft-tissue]
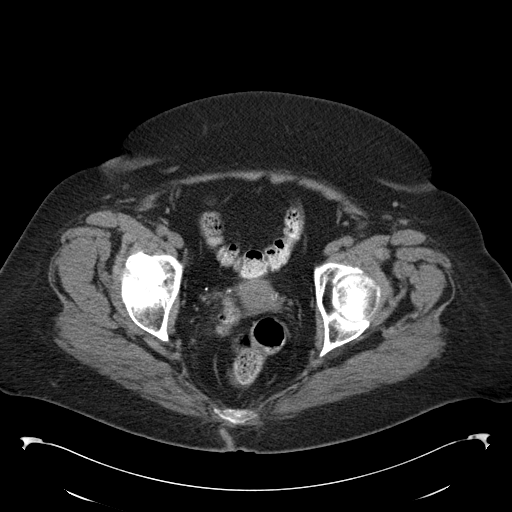
[im 22/88  soft-tissue]
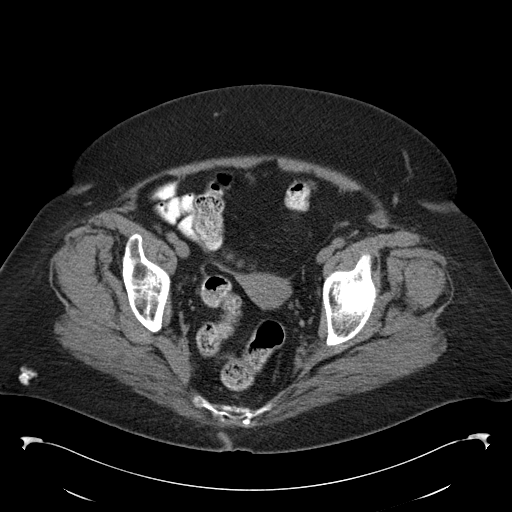
[im 30/88  soft-tissue]
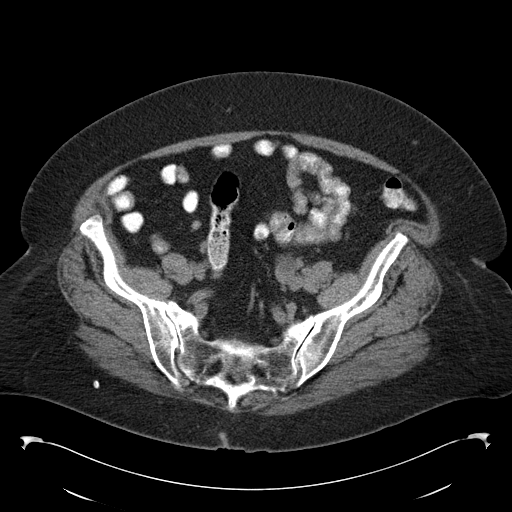
[im 37/88  soft-tissue]
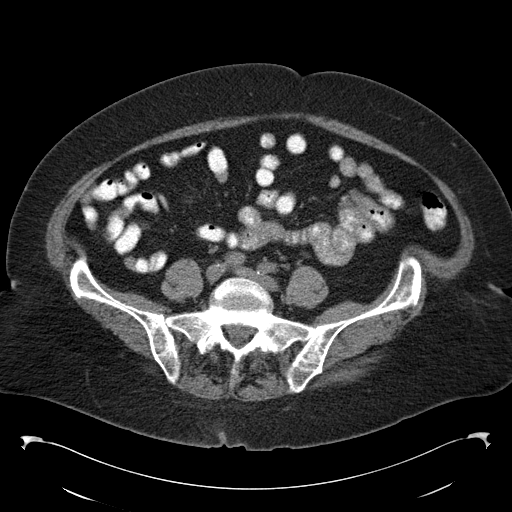
[im 40/88  soft-tissue]
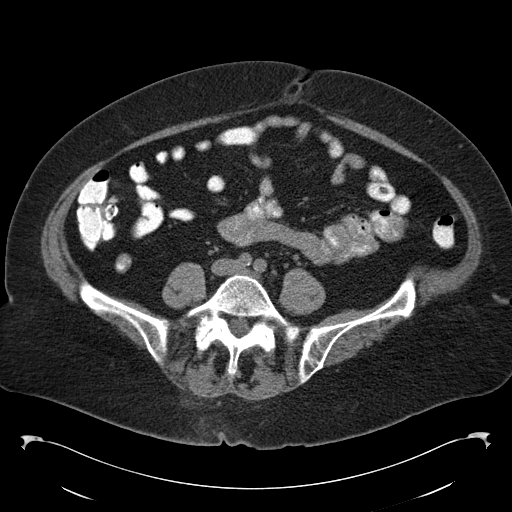
[im 48/88  soft-tissue]
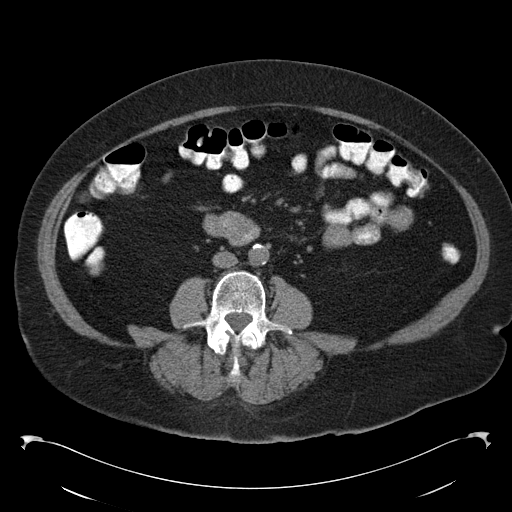
[im 51/88  soft-tissue]
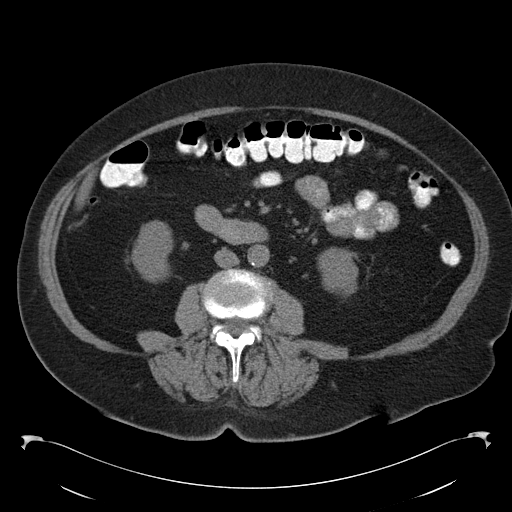
[im 51/88  bone]
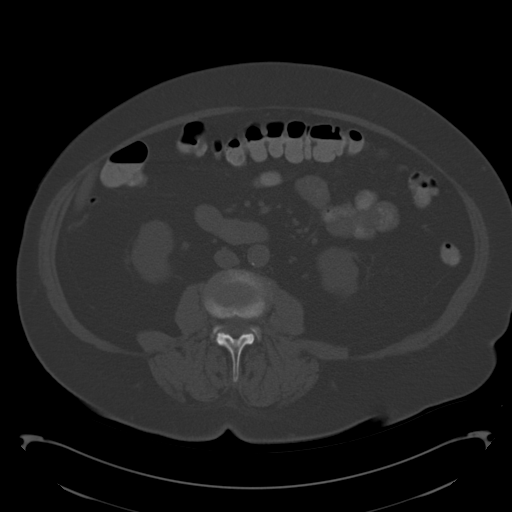
[im 59/88  soft-tissue]
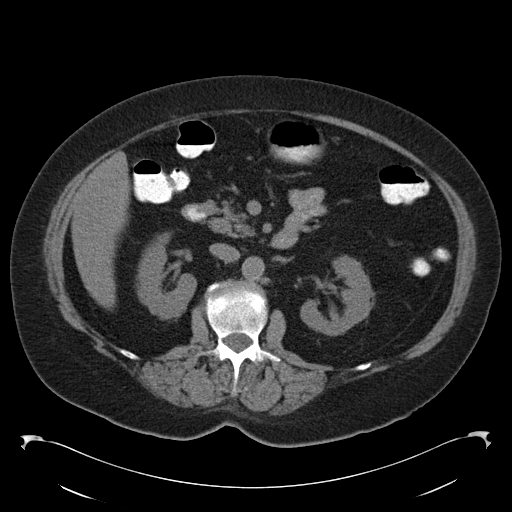
[im 66/88  soft-tissue]
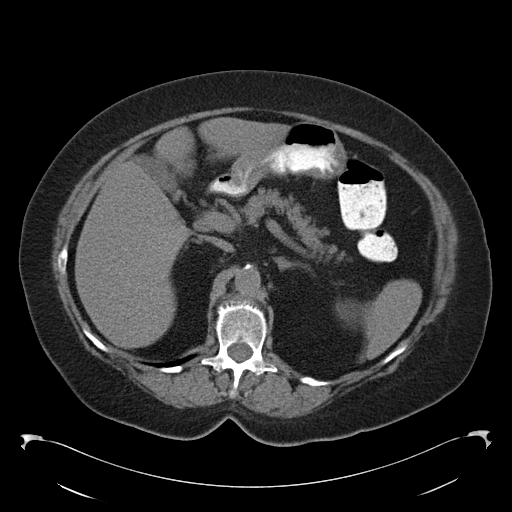
[im 69/88  soft-tissue]
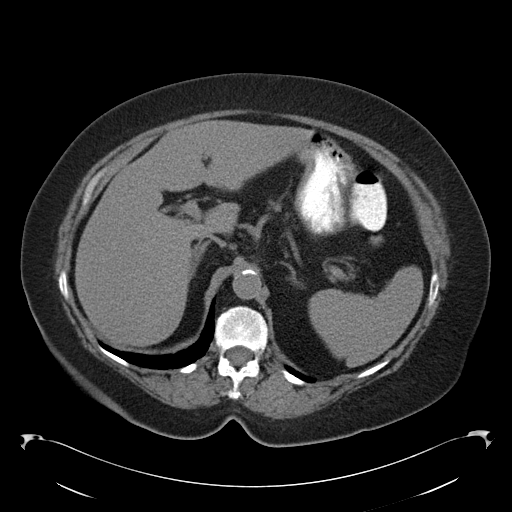
[im 77/88  soft-tissue]
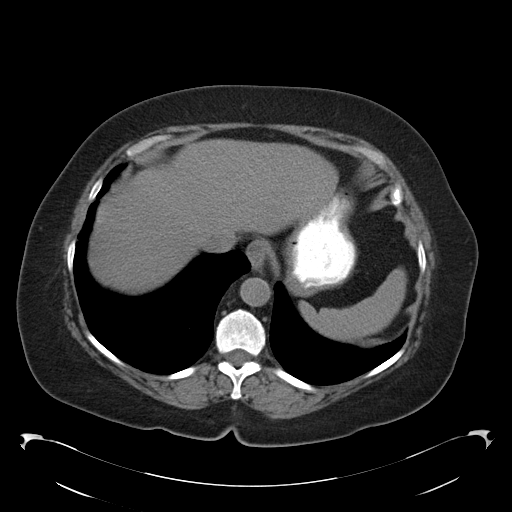
[im 84/88  soft-tissue]
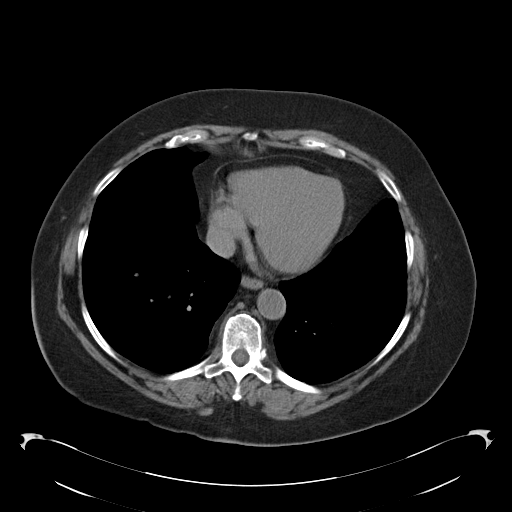

[Series 5: cor routine abd pel wo · coronal · 0.82mm/px · 3 of 158 slices shown]
[im 53/158  soft-tissue]
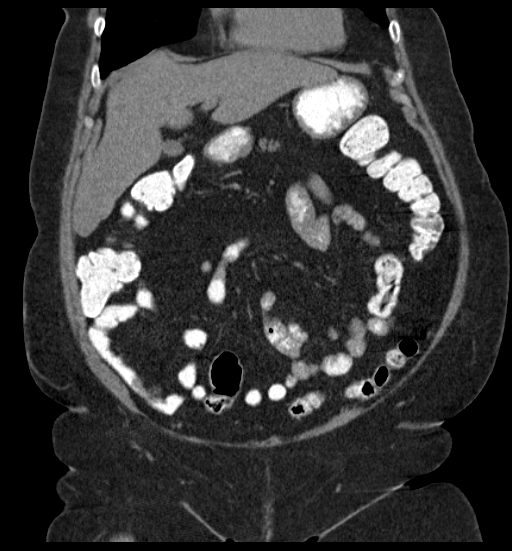
[im 70/158  soft-tissue]
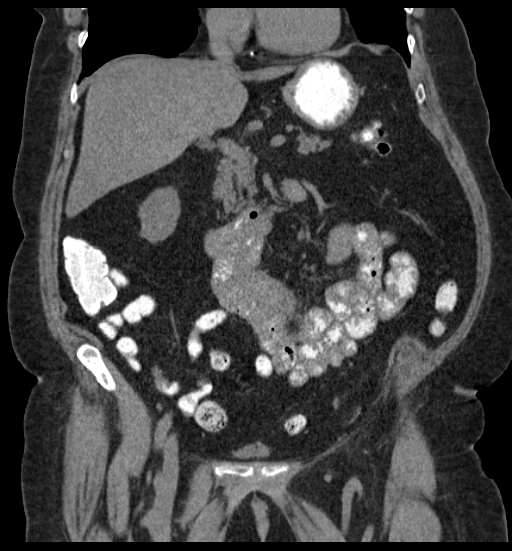
[im 88/158  soft-tissue]
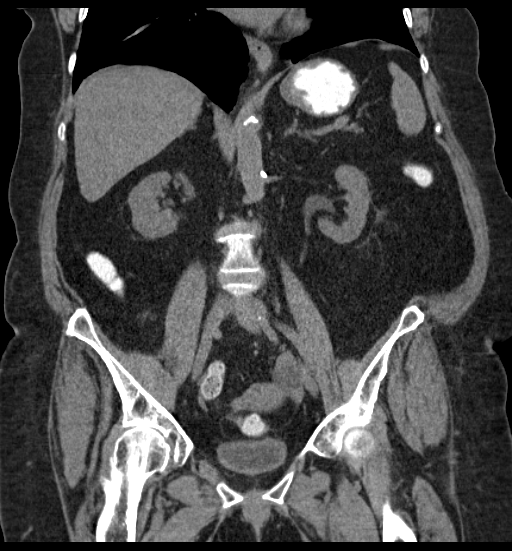

[17 of 46 positions shown; findings below may reference images not displayed]

FINDINGS: Lower chest:  No significant abnormalities

Hepatobiliary: Negative

Pancreas: Normal

Spleen: Normal

Adrenals/Urinary Tract: Adrenal glands are normal. 3 mm
nonobstructing stone midpole right kidney. No stones on the left. 4
mm low-attenuation lesion midpole left kidney too small to
characterize possibly a tiny cyst. No hydronephrosis. Bladder
normal.

Stomach/Bowel: Large bowel and appendix are normal. Small bowel is
normal. No significant fecal retention. Stomach is normal.

Vascular/Lymphatic: Mild atherosclerotic calcification of the aorta.
No significant adenopathy.

Reproductive: Cystic lesion left adnexa with average attenuation
value of 0 measuring 2.8 cm most likely an ovarian cyst or
paraovarian cyst. This is similar to 03/07/2013. Uterus and right
adnexa normal.

Other: There is no free fluid in the abdomen or pelvis.

Musculoskeletal: There are no acute musculoskeletal findings
IMPRESSION: No acute findings. No abnormalities to account for the patient's
symptoms.

## 2016-07-26 ENCOUNTER — Encounter
Admission: RE | Disposition: A | Discharge status: Skilled Nursing Facility | Payer: Self-pay | Source: Ambulatory Visit | Attending: Orthopedic Surgery

## 2016-07-26 ENCOUNTER — Encounter: Payer: Self-pay | Admitting: *Deleted

## 2016-07-26 ENCOUNTER — Inpatient Hospital Stay: Payer: Commercial Managed Care - HMO | Admitting: Anesthesiology

## 2016-07-26 ENCOUNTER — Inpatient Hospital Stay
Admission: RE | Admit: 2016-07-26 | Discharge: 2016-07-29 | DRG: 470 | Disposition: A | Discharge status: Skilled Nursing Facility | Payer: Commercial Managed Care - HMO | Source: Ambulatory Visit | Attending: Orthopedic Surgery | Admitting: Orthopedic Surgery

## 2016-07-26 ENCOUNTER — Inpatient Hospital Stay: Payer: Commercial Managed Care - HMO

## 2016-07-26 DIAGNOSIS — Z888 Allergy status to other drugs, medicaments and biological substances status: Secondary | ICD-10-CM

## 2016-07-26 DIAGNOSIS — Z800 Family history of malignant neoplasm of digestive organs: Secondary | ICD-10-CM | POA: Diagnosis not present

## 2016-07-26 DIAGNOSIS — M1711 Unilateral primary osteoarthritis, right knee: Secondary | ICD-10-CM | POA: Diagnosis not present

## 2016-07-26 DIAGNOSIS — Z86010 Personal history of colonic polyps: Secondary | ICD-10-CM

## 2016-07-26 DIAGNOSIS — M1990 Unspecified osteoarthritis, unspecified site: Secondary | ICD-10-CM | POA: Diagnosis not present

## 2016-07-26 DIAGNOSIS — M25569 Pain in unspecified knee: Secondary | ICD-10-CM | POA: Diagnosis not present

## 2016-07-26 DIAGNOSIS — I10 Essential (primary) hypertension: Secondary | ICD-10-CM | POA: Diagnosis present

## 2016-07-26 DIAGNOSIS — F419 Anxiety disorder, unspecified: Secondary | ICD-10-CM | POA: Diagnosis present

## 2016-07-26 DIAGNOSIS — K219 Gastro-esophageal reflux disease without esophagitis: Secondary | ICD-10-CM | POA: Diagnosis not present

## 2016-07-26 DIAGNOSIS — E785 Hyperlipidemia, unspecified: Secondary | ICD-10-CM | POA: Diagnosis not present

## 2016-07-26 DIAGNOSIS — M25561 Pain in right knee: Secondary | ICD-10-CM | POA: Diagnosis present

## 2016-07-26 DIAGNOSIS — M1710 Unilateral primary osteoarthritis, unspecified knee: Secondary | ICD-10-CM | POA: Diagnosis present

## 2016-07-26 DIAGNOSIS — M150 Primary generalized (osteo)arthritis: Secondary | ICD-10-CM | POA: Diagnosis not present

## 2016-07-26 DIAGNOSIS — M542 Cervicalgia: Secondary | ICD-10-CM | POA: Diagnosis not present

## 2016-07-26 DIAGNOSIS — M179 Osteoarthritis of knee, unspecified: Secondary | ICD-10-CM | POA: Diagnosis not present

## 2016-07-26 DIAGNOSIS — Z471 Aftercare following joint replacement surgery: Secondary | ICD-10-CM | POA: Diagnosis not present

## 2016-07-26 DIAGNOSIS — Z79899 Other long term (current) drug therapy: Secondary | ICD-10-CM

## 2016-07-26 DIAGNOSIS — Z7982 Long term (current) use of aspirin: Secondary | ICD-10-CM | POA: Diagnosis not present

## 2016-07-26 DIAGNOSIS — M170 Bilateral primary osteoarthritis of knee: Principal | ICD-10-CM | POA: Diagnosis present

## 2016-07-26 DIAGNOSIS — R251 Tremor, unspecified: Secondary | ICD-10-CM | POA: Diagnosis not present

## 2016-07-26 DIAGNOSIS — E039 Hypothyroidism, unspecified: Secondary | ICD-10-CM | POA: Diagnosis not present

## 2016-07-26 DIAGNOSIS — Z882 Allergy status to sulfonamides status: Secondary | ICD-10-CM

## 2016-07-26 DIAGNOSIS — Z96651 Presence of right artificial knee joint: Secondary | ICD-10-CM | POA: Diagnosis not present

## 2016-07-26 DIAGNOSIS — E871 Hypo-osmolality and hyponatremia: Secondary | ICD-10-CM | POA: Diagnosis not present

## 2016-07-26 DIAGNOSIS — G8918 Other acute postprocedural pain: Secondary | ICD-10-CM

## 2016-07-26 DIAGNOSIS — Z7401 Bed confinement status: Secondary | ICD-10-CM | POA: Diagnosis not present

## 2016-07-26 HISTORY — PX: TOTAL KNEE ARTHROPLASTY: SHX125

## 2016-07-26 LAB — TYPE AND SCREEN
ABO/RH(D): A POS
Antibody Screen: NEGATIVE

## 2016-07-26 LAB — CREATININE, SERUM
Creatinine, Ser: 0.95 mg/dL (ref 0.44–1.00)
GFR calc Af Amer: >60 mL/min (ref >60)
GFR, EST NON AFRICAN AMERICAN: 58 mL/min — AB (ref >60)

## 2016-07-26 LAB — CBC
HEMATOCRIT: 44 % (ref 35.0–47.0)
HEMOGLOBIN: 15 g/dL (ref 12.0–16.0)
MCH: 29.9 pg (ref 26.0–34.0)
MCHC: 34.1 g/dL (ref 32.0–36.0)
MCV: 87.6 fL (ref 80.0–100.0)
Platelets: 192 10*3/uL (ref 150–440)
RBC: 5.02 MIL/uL (ref 3.80–5.20)
RDW: 12.9 % (ref 11.5–14.5)
WBC: 6.7 10*3/uL (ref 3.6–11.0)

## 2016-07-26 SURGERY — ARTHROPLASTY, KNEE, TOTAL
Anesthesia: Spinal | Site: Knee | Laterality: Right | Wound class: Clean

## 2016-07-26 MED ORDER — CEFAZOLIN SODIUM-DEXTROSE 2-4 GM/100ML-% IV SOLN
INTRAVENOUS | Status: AC
  Filled 2016-07-26: qty 100

## 2016-07-26 MED ORDER — VANCOMYCIN HCL 500 MG IV SOLR
500.0000 mg | Freq: Once | INTRAVENOUS | Status: AC
  Administered 2016-07-26: 500 mg via INTRAVENOUS
  Filled 2016-07-26: qty 500

## 2016-07-26 MED ORDER — METHOCARBAMOL 1000 MG/10ML IJ SOLN
500.0000 mg | Freq: Four times a day (QID) | INTRAVENOUS | Status: DC | PRN
  Filled 2016-07-26: qty 5

## 2016-07-26 MED ORDER — PHENYLEPHRINE HCL 10 MG/ML IJ SOLN
INTRAMUSCULAR | Status: DC | PRN
  Administered 2016-07-26: 100 ug via INTRAVENOUS

## 2016-07-26 MED ORDER — NEOMYCIN-POLYMYXIN B GU 40-200000 IR SOLN
Status: DC | PRN
  Administered 2016-07-26: 14 mL

## 2016-07-26 MED ORDER — DIPHENHYDRAMINE HCL 12.5 MG/5ML PO ELIX: 12.5000 mg | ORAL_SOLUTION | ORAL | Status: DC | PRN

## 2016-07-26 MED ORDER — LACTATED RINGERS IV SOLN
INTRAVENOUS | Status: DC
  Administered 2016-07-26 (×2): via INTRAVENOUS

## 2016-07-26 MED ORDER — BUPIVACAINE-EPINEPHRINE (PF) 0.25% -1:200000 IJ SOLN
INTRAMUSCULAR | Status: DC | PRN
  Administered 2016-07-26: 30 mL

## 2016-07-26 MED ORDER — FAMOTIDINE 20 MG PO TABS
20.0000 mg | ORAL_TABLET | Freq: Once | ORAL | Status: AC
  Administered 2016-07-26: 20 mg via ORAL

## 2016-07-26 MED ORDER — DOCUSATE SODIUM 100 MG PO CAPS
100.0000 mg | ORAL_CAPSULE | Freq: Two times a day (BID) | ORAL | Status: DC
  Administered 2016-07-26 – 2016-07-29 (×7): 100 mg via ORAL
  Filled 2016-07-26 (×7): qty 1

## 2016-07-26 MED ORDER — SODIUM CHLORIDE 0.9 % IJ SOLN
INTRAMUSCULAR | Status: AC
  Filled 2016-07-26: qty 50

## 2016-07-26 MED ORDER — BISACODYL 10 MG RE SUPP: 10.0000 mg | Freq: Every day | RECTAL | Status: DC | PRN

## 2016-07-26 MED ORDER — BUPIVACAINE LIPOSOME 1.3 % IJ SUSP
INTRAMUSCULAR | Status: AC
  Filled 2016-07-26: qty 20

## 2016-07-26 MED ORDER — ZOLPIDEM TARTRATE 5 MG PO TABS: 5.0000 mg | ORAL_TABLET | Freq: Every evening | ORAL | Status: DC | PRN

## 2016-07-26 MED ORDER — ALPRAZOLAM 0.25 MG PO TABS: 0.1250 mg | ORAL_TABLET | Freq: Every evening | ORAL | Status: DC | PRN

## 2016-07-26 MED ORDER — ENOXAPARIN SODIUM 30 MG/0.3ML ~~LOC~~ SOLN
30.0000 mg | Freq: Two times a day (BID) | SUBCUTANEOUS | Status: DC
  Administered 2016-07-27 – 2016-07-29 (×5): 30 mg via SUBCUTANEOUS
  Filled 2016-07-26 (×5): qty 0.3

## 2016-07-26 MED ORDER — ACETAMINOPHEN 10 MG/ML IV SOLN
INTRAVENOUS | Status: AC
  Filled 2016-07-26: qty 100

## 2016-07-26 MED ORDER — SODIUM CHLORIDE 0.9 % IV SOLN
INTRAVENOUS | Status: DC
Start: 2016-07-26 — End: 2016-07-29
  Administered 2016-07-26 – 2016-07-27 (×3): via INTRAVENOUS

## 2016-07-26 MED ORDER — ONDANSETRON HCL 4 MG/2ML IJ SOLN: 4.0000 mg | Freq: Four times a day (QID) | INTRAMUSCULAR | Status: DC | PRN

## 2016-07-26 MED ORDER — BUPIVACAINE-EPINEPHRINE (PF) 0.25% -1:200000 IJ SOLN
INTRAMUSCULAR | Status: AC
  Filled 2016-07-26: qty 30

## 2016-07-26 MED ORDER — PROPRANOLOL HCL 40 MG PO TABS
20.0000 mg | ORAL_TABLET | Freq: Every day | ORAL | Status: DC
  Administered 2016-07-27 – 2016-07-29 (×3): 20 mg via ORAL
  Filled 2016-07-26 (×3): qty 1

## 2016-07-26 MED ORDER — MORPHINE SULFATE 10 MG/ML IJ SOLN
INTRAMUSCULAR | Status: DC | PRN
  Administered 2016-07-26: 10 mg

## 2016-07-26 MED ORDER — MENTHOL 3 MG MT LOZG
1.0000 | LOZENGE | OROMUCOSAL | Status: DC | PRN
  Filled 2016-07-26: qty 9

## 2016-07-26 MED ORDER — NEOMYCIN-POLYMYXIN B GU 40-200000 IR SOLN
Status: AC
  Filled 2016-07-26: qty 20

## 2016-07-26 MED ORDER — ACETAMINOPHEN 650 MG RE SUPP
650.0000 mg | Freq: Four times a day (QID) | RECTAL | Status: DC | PRN
Start: 2016-07-26 — End: 2016-07-29

## 2016-07-26 MED ORDER — TRIAMCINOLONE ACETONIDE 0.1 % EX CREA
1.0000 "application " | TOPICAL_CREAM | Freq: Two times a day (BID) | CUTANEOUS | Status: DC
  Administered 2016-07-27 – 2016-07-28 (×2): 1 via TOPICAL
  Filled 2016-07-26: qty 15

## 2016-07-26 MED ORDER — BUPIVACAINE HCL (PF) 0.5 % IJ SOLN
INTRAMUSCULAR | Status: DC | PRN
  Administered 2016-07-26: 3 mL

## 2016-07-26 MED ORDER — PROPOFOL 500 MG/50ML IV EMUL
INTRAVENOUS | Status: DC | PRN
  Administered 2016-07-26: 50 ug/kg/min via INTRAVENOUS

## 2016-07-26 MED ORDER — ONDANSETRON HCL 4 MG/2ML IJ SOLN: 4.0000 mg | Freq: Once | INTRAMUSCULAR | Status: DC | PRN

## 2016-07-26 MED ORDER — PHENOL 1.4 % MT LIQD
1.0000 | OROMUCOSAL | Status: DC | PRN
  Filled 2016-07-26: qty 177

## 2016-07-26 MED ORDER — TRIAMTERENE-HCTZ 75-50 MG PO TABS
0.5000 | ORAL_TABLET | ORAL | Status: DC
  Filled 2016-07-26: qty 0.5

## 2016-07-26 MED ORDER — OXYCODONE HCL 5 MG PO TABS
5.0000 mg | ORAL_TABLET | ORAL | Status: DC | PRN
  Administered 2016-07-26: 5 mg via ORAL
  Administered 2016-07-26 – 2016-07-29 (×11): 10 mg via ORAL
  Filled 2016-07-26 (×5): qty 2
  Filled 2016-07-26: qty 1
  Filled 2016-07-26 (×6): qty 2

## 2016-07-26 MED ORDER — MIDAZOLAM HCL 5 MG/5ML IJ SOLN
INTRAMUSCULAR | Status: DC | PRN
  Administered 2016-07-26 (×2): 1 mg via INTRAVENOUS

## 2016-07-26 MED ORDER — ACETAMINOPHEN 325 MG PO TABS
650.0000 mg | ORAL_TABLET | Freq: Four times a day (QID) | ORAL | Status: DC | PRN
Start: 2016-07-26 — End: 2016-07-29
  Administered 2016-07-26 – 2016-07-27 (×2): 650 mg via ORAL
  Filled 2016-07-26 (×2): qty 2

## 2016-07-26 MED ORDER — CITALOPRAM HYDROBROMIDE 20 MG PO TABS
20.0000 mg | ORAL_TABLET | Freq: Every day | ORAL | Status: DC
  Administered 2016-07-26 – 2016-07-29 (×4): 20 mg via ORAL
  Filled 2016-07-26 (×4): qty 1

## 2016-07-26 MED ORDER — ACETAMINOPHEN 10 MG/ML IV SOLN
INTRAVENOUS | Status: DC | PRN
  Administered 2016-07-26: 1000 mg via INTRAVENOUS

## 2016-07-26 MED ORDER — TRANEXAMIC ACID 1000 MG/10ML IV SOLN
INTRAVENOUS | Status: AC
  Filled 2016-07-26: qty 10

## 2016-07-26 MED ORDER — METHOCARBAMOL 500 MG PO TABS
500.0000 mg | ORAL_TABLET | Freq: Four times a day (QID) | ORAL | Status: DC | PRN
  Administered 2016-07-27: 500 mg via ORAL
  Filled 2016-07-26: qty 1

## 2016-07-26 MED ORDER — FENTANYL CITRATE (PF) 100 MCG/2ML IJ SOLN: 25.0000 ug | INTRAMUSCULAR | Status: DC | PRN

## 2016-07-26 MED ORDER — MAGNESIUM HYDROXIDE 400 MG/5ML PO SUSP
30.0000 mL | Freq: Every day | ORAL | Status: DC | PRN
  Administered 2016-07-27: 30 mL via ORAL
  Filled 2016-07-26: qty 30

## 2016-07-26 MED ORDER — ASPIRIN EC 81 MG PO TBEC
81.0000 mg | DELAYED_RELEASE_TABLET | Freq: Every day | ORAL | Status: DC
  Administered 2016-07-26 – 2016-07-29 (×4): 81 mg via ORAL
  Filled 2016-07-26 (×4): qty 1

## 2016-07-26 MED ORDER — SODIUM CHLORIDE 0.9 % IV SOLN
INTRAVENOUS | Status: DC | PRN
  Administered 2016-07-26: 30 ug/min via INTRAVENOUS

## 2016-07-26 MED ORDER — METOCLOPRAMIDE HCL 5 MG/ML IJ SOLN: 5.0000 mg | Freq: Three times a day (TID) | INTRAMUSCULAR | Status: DC | PRN

## 2016-07-26 MED ORDER — ONDANSETRON HCL 4 MG/2ML IJ SOLN
INTRAMUSCULAR | Status: DC | PRN
  Administered 2016-07-26: 4 mg via INTRAVENOUS

## 2016-07-26 MED ORDER — KETAMINE HCL 50 MG/ML IJ SOLN
INTRAMUSCULAR | Status: DC | PRN
  Administered 2016-07-26 (×2): 25 mg via INTRAMUSCULAR

## 2016-07-26 MED ORDER — CEFAZOLIN SODIUM-DEXTROSE 2-4 GM/100ML-% IV SOLN
2.0000 g | Freq: Four times a day (QID) | INTRAVENOUS | Status: AC
  Administered 2016-07-26 – 2016-07-27 (×2): 2 g via INTRAVENOUS
  Filled 2016-07-26 (×5): qty 100

## 2016-07-26 MED ORDER — LEVOTHYROXINE SODIUM 75 MCG PO TABS
75.0000 ug | ORAL_TABLET | Freq: Every day | ORAL | Status: DC
  Administered 2016-07-27 – 2016-07-29 (×3): 75 ug via ORAL
  Filled 2016-07-26 (×3): qty 1

## 2016-07-26 MED ORDER — CEFAZOLIN SODIUM-DEXTROSE 2-4 GM/100ML-% IV SOLN
2.0000 g | Freq: Once | INTRAVENOUS | Status: AC
  Administered 2016-07-26: 2 g via INTRAVENOUS

## 2016-07-26 MED ORDER — MAGNESIUM CITRATE PO SOLN
1.0000 | Freq: Once | ORAL | Status: DC | PRN
  Filled 2016-07-26: qty 296

## 2016-07-26 MED ORDER — METOCLOPRAMIDE HCL 10 MG PO TABS
5.0000 mg | ORAL_TABLET | Freq: Two times a day (BID) | ORAL | Status: DC
  Administered 2016-07-26 – 2016-07-29 (×7): 5 mg via ORAL
  Filled 2016-07-26 (×2): qty 1
  Filled 2016-07-26: qty 2
  Filled 2016-07-26 (×4): qty 1

## 2016-07-26 MED ORDER — NYSTATIN 100000 UNIT/GM EX CREA
1.0000 "application " | TOPICAL_CREAM | Freq: Two times a day (BID) | CUTANEOUS | Status: DC
  Administered 2016-07-27 – 2016-07-28 (×2): 1 via TOPICAL
  Filled 2016-07-26: qty 15

## 2016-07-26 MED ORDER — METOCLOPRAMIDE HCL 10 MG PO TABS
5.0000 mg | ORAL_TABLET | Freq: Three times a day (TID) | ORAL | Status: DC | PRN
  Administered 2016-07-27: 10 mg via ORAL
  Filled 2016-07-26: qty 2

## 2016-07-26 MED ORDER — SODIUM CHLORIDE 0.9 % IV SOLN
INTRAVENOUS | Status: DC | PRN
  Administered 2016-07-26: 60 mL

## 2016-07-26 MED ORDER — FAMOTIDINE 20 MG PO TABS
ORAL_TABLET | ORAL | Status: AC
  Administered 2016-07-26: 20 mg via ORAL
  Filled 2016-07-26: qty 1

## 2016-07-26 MED ORDER — MORPHINE SULFATE (PF) 2 MG/ML IV SOLN
2.0000 mg | INTRAVENOUS | Status: DC | PRN
  Administered 2016-07-26 – 2016-07-27 (×7): 2 mg via INTRAVENOUS
  Filled 2016-07-26 (×7): qty 1

## 2016-07-26 MED ORDER — ONDANSETRON HCL 4 MG PO TABS: 4.0000 mg | ORAL_TABLET | Freq: Four times a day (QID) | ORAL | Status: DC | PRN

## 2016-07-26 MED ORDER — MORPHINE SULFATE (PF) 10 MG/ML IV SOLN
INTRAVENOUS | Status: AC
  Filled 2016-07-26: qty 1

## 2016-07-26 SURGICAL SUPPLY — 60 items
BANDAGE ACE 6X5 VEL STRL LF (GAUZE/BANDAGES/DRESSINGS) ×3 IMPLANT
BLADE SAW 1 (BLADE) ×3 IMPLANT
BLOCK CUTTING FEMUR 4 RT (MISCELLANEOUS) IMPLANT
BLOCK CUTTING TIBIAL 4 ME (MISCELLANEOUS) IMPLANT
CANISTER SUCT 1200ML W/VALVE (MISCELLANEOUS) ×3 IMPLANT
CANISTER SUCT 3000ML (MISCELLANEOUS) ×6 IMPLANT
CAPT KNEE TOTAL 3 ×3 IMPLANT
CATH FOL LEG HOLDER (MISCELLANEOUS) ×3 IMPLANT
CATH TRAY METER 16FR LF (MISCELLANEOUS) ×3 IMPLANT
CEMENT HV SMART SET (Cement) ×3 IMPLANT
CHLORAPREP W/TINT 26ML (MISCELLANEOUS) ×6 IMPLANT
COOLER POLAR GLACIER W/PUMP (MISCELLANEOUS) ×3 IMPLANT
CUFF TOURN 24 STER (MISCELLANEOUS) ×3 IMPLANT
CUFF TOURN 30 STER DUAL PORT (MISCELLANEOUS) IMPLANT
DRAPE INCISE IOBAN 66X45 STRL (DRAPES) ×6 IMPLANT
DRAPE SHEET LG 3/4 BI-LAMINATE (DRAPES) ×6 IMPLANT
ELECT CAUTERY BLADE 6.4 (BLADE) ×3 IMPLANT
ELECT REM PT RETURN 9FT ADLT (ELECTROSURGICAL) ×3
ELECTRODE REM PT RTRN 9FT ADLT (ELECTROSURGICAL) ×1 IMPLANT
FEMUR BONE MODEL IMPLANT
GAUZE PETRO XEROFOAM 1X8 (MISCELLANEOUS) ×3 IMPLANT
GAUZE SPONGE 4X4 12PLY STRL (GAUZE/BANDAGES/DRESSINGS) ×3 IMPLANT
GLOVE BIOGEL PI IND STRL 9 (GLOVE) ×1 IMPLANT
GLOVE BIOGEL PI INDICATOR 9 (GLOVE) ×2
GLOVE INDICATOR 8.0 STRL GRN (GLOVE) ×3 IMPLANT
GLOVE SURG ORTHO 8.0 STRL STRW (GLOVE) ×3 IMPLANT
GLOVE SURG ORTHO 9.0 STRL STRW (GLOVE) ×3 IMPLANT
GOWN SRG 2XL LVL 4 RGLN SLV (GOWNS) ×1 IMPLANT
GOWN STRL NON-REIN 2XL LVL4 (GOWNS) ×2
GOWN STRL REUS W/ TWL LRG LVL3 (GOWN DISPOSABLE) ×1 IMPLANT
GOWN STRL REUS W/ TWL XL LVL3 (GOWN DISPOSABLE) ×1 IMPLANT
GOWN STRL REUS W/TWL LRG LVL3 (GOWN DISPOSABLE) ×2
GOWN STRL REUS W/TWL XL LVL3 (GOWN DISPOSABLE) ×2
HANDPIECE INTERPULSE COAX TIP (DISPOSABLE) ×2
HOOD PEEL AWAY FLYTE STAYCOOL (MISCELLANEOUS) ×6 IMPLANT
IMMBOLIZER KNEE 19 BLUE UNIV (SOFTGOODS) ×3 IMPLANT
KIT RM TURNOVER STRD PROC AR (KITS) ×3 IMPLANT
KNEE MEDACTA TIBIAL/FEMORAL BL (Knees) ×3 IMPLANT
KNIFE SCULPS 14X20 (INSTRUMENTS) ×3 IMPLANT
NDL SAFETY 18GX1.5 (NEEDLE) ×3 IMPLANT
NEEDLE SPNL 18GX3.5 QUINCKE PK (NEEDLE) ×3 IMPLANT
NEEDLE SPNL 20GX3.5 QUINCKE YW (NEEDLE) ×3 IMPLANT
NS IRRIG 1000ML POUR BTL (IV SOLUTION) ×3 IMPLANT
PACK TOTAL KNEE (MISCELLANEOUS) ×3 IMPLANT
PAD WRAPON POLAR KNEE (MISCELLANEOUS) ×1 IMPLANT
SET HNDPC FAN SPRY TIP SCT (DISPOSABLE) ×1 IMPLANT
SOL .9 NS 3000ML IRR  AL (IV SOLUTION) ×2
SOL .9 NS 3000ML IRR UROMATIC (IV SOLUTION) ×1 IMPLANT
STAPLER SKIN PROX 35W (STAPLE) ×3 IMPLANT
SUCTION FRAZIER HANDLE 10FR (MISCELLANEOUS) ×2
SUCTION TUBE FRAZIER 10FR DISP (MISCELLANEOUS) ×1 IMPLANT
SUT DVC 2 QUILL PDO  T11 36X36 (SUTURE) ×2
SUT DVC 2 QUILL PDO T11 36X36 (SUTURE) ×1 IMPLANT
SUT DVC QUILL MONODERM 30X30 (SUTURE) ×3 IMPLANT
SYR 20CC LL (SYRINGE) ×3 IMPLANT
SYR 50ML LL SCALE MARK (SYRINGE) ×6 IMPLANT
TIBIAL BONE MODEL IMPLANT
TOWEL OR 17X26 4PK STRL BLUE (TOWEL DISPOSABLE) ×3 IMPLANT
TOWER CARTRIDGE SMART MIX (DISPOSABLE) ×3 IMPLANT
WRAPON POLAR PAD KNEE (MISCELLANEOUS) ×3

## 2016-07-26 NOTE — Transfer of Care (Signed)
Immediate Anesthesia Transfer of Care Note  Patient: Erika Nelson  Procedure(s) Performed: Procedure(s): TOTAL KNEE ARTHROPLASTY (Right)  Patient Location: PACU  Anesthesia Type:Spinal  Level of Consciousness: sedated  Airway & Oxygen Therapy: Patient Spontanous Breathing and Patient connected to face mask oxygen  Post-op Assessment: Report given to RN and Post -op Vital signs reviewed and stable  Post vital signs: Reviewed and stable  Last Vitals:  Vitals:   07/26/16 0921  BP: (!) 174/84  Pulse: 72  Resp: 18  Temp: 36.4 C    Last Pain:  Vitals:   07/26/16 0921  TempSrc: Tympanic         Complications: No apparent anesthesia complications

## 2016-07-26 NOTE — Op Note (Signed)
Vancomycin started @ 1117 am as instructed by Mindy Huffines, RN - Ancef will be sent to OR with patient

## 2016-07-26 NOTE — Op Note (Signed)
07/26/2016  2:14 PM  PATIENT:  Erika Nelson  75 y.o. female  PRE-OPERATIVE DIAGNOSIS:  OSTEOARTHRITIS right knee  POST-OPERATIVE DIAGNOSIS:  OSTEOARTHRITIS right knee  PROCEDURE:  Procedure(s): TOTAL KNEE ARTHROPLASTY (Right)  SURGEON: Laurene Footman, MD  ASSISTANTS: Rachelle Hora [facility]  ANESTHESIA:   spinal  EBL:  Total I/O In: 1300 [I.V.:1300] Out: 350 [Urine:300; Blood:50]  BLOOD ADMINISTERED:none  DRAINS: none   LOCAL MEDICATIONS USED:  MARCAINE    and OTHER exparel, morphine  SPECIMEN:  Source of Specimen:  Cut ends of bone  DISPOSITION OF SPECIMEN:  PATHOLOGY  COUNTS:  YES  TOURNIQUET:   82 minutes at 300 mmHg  IMPLANTS: Medacta GMK right 4 femur with 3 tibia and 12 mm PS insert, to patella, all components cemented  DICTATION: .Dragon Dictation patient brought the operating room and after adequate anesthesia was obtained, right leg is prepped draped in sterile fashion. After patient identification and timeout procedures were completed tourniquet was raised to 300 mmHg. A midline skin incision was made followed by medial parapatellar arthrotomy. Exposure revealed extensive tricompartmental osteoarthritis with extensive erosions in the lateral compartment but also exposed bone on the medial compartment with extensive spurring as well as erosions of the patella and femoral trochlea. The anterior cruciate ligament and fat pad were excised. The Medacta tibia cutting guide was applied and the proximal tibia cut carried out and tibial bone removed along the menisci. The wrist to femoral cut was carried out the 4-in-1 cutting block applied anterior posterior and chamfer cuts made. PCL was released and trials were placed with a size 3 put tibia pinned in place and proximal tibial preparation carried out with hand reaming for reaming and then the keel punch with trial left in place 12 mm insert was placed and the femoral component inserted with full extension and correction of the  valgus deformity. Some pie crusting of the lateral collateral was performed and flexion with lamina spreader used to tension the lateral side at this point the the trochlear groove cut was made and PS drill hole made through the tibial tray femoral femoral trial guides. All trials were removed and the patella was cut resecting a minimal amount as there is extensive wear of the lateral facet. The patella sized to size 2 after drilling lateral releases performed because the severe valgus preop. The wounds were thoroughly irrigated and the above local injected. Next the bony surfaces were dried and the tibial component was cemented into place followed by his placement of the femoral component and a tibial trial tibia the tibia trials 12 mm was the appropriate tension on the knee and after the cement was set that was chosen as the final implant. Patella was sized to an cemented into place with excess cement removed. After the cemented set and excess cemented removed been removed the patella was noted to track well with no touch technique. The arthrotomy was repaired using #2 Ethibond to repair the medial retinaculum and reinforce the repair following this a heavy Brion Aliment was used to close the capsule TXA was injected into the joint next the subcutaneous tissues tissue was closed using small Quill followed by skin staples a ProleVA plus drain was used to cover the wound with wound VAC and a pillow placed behind the knee to allow for flexion with her fixed valgus now corrected  PLAN OF CARE: Admit to inpatient   PATIENT DISPOSITION:  PACU - hemodynamically stable.

## 2016-07-26 NOTE — NC FL2 (Signed)
[place] LEVEL OF CARE SCREENING TOOL     IDENTIFICATION  Patient Name: Erika Nelson Birthdate: 27-Apr-1941 Sex: female Admission Date (Current Location): 07/26/2016  [place] and [place] Number:  Engineering geologist and Address:  [facility], [address] [place], [place] [address]      Provider Number: [id number]  Attending Physician Name and Address:  Hessie Knows, MD  Relative Name and Phone Number:       Current Level of Care: Hospital Recommended Level of Care: [place] Prior Approval Number:    Date Approved/Denied:   PASRR Number:  [id number])  Discharge Plan: SNF    Current Diagnoses: Patient Active Problem List   Diagnosis Date Noted  . Osteoarthrosis, localized, primary, knee 07/26/2016   HTN (hypertension) 06/04/2014   Generalized OA 06/04/2014   Cervicalgia    Other and unspecified hyperlipidemia    Neuralgia, neuritis, and radiculitis, unspecified    Headache(784.0)    Diverticulosis       Orientation RESPIRATION BLADDER Height & Weight     Self, Time, Situation, Place  O2 (2 Liters Oxygen ) Continent Weight: 220 lb (99.8 kg) Height:  $Remove'5\' 5"'[id number]$  (165.1 cm)  BEHAVIORAL SYMPTOMS/MOOD NEUROLOGICAL BOWEL NUTRITION STATUS   (none )  (none ) Continent Diet (Diet: Clear Liquid )  AMBULATORY STATUS COMMUNICATION OF NEEDS Skin   Extensive Assist Verbally Surgical wounds, Wound Vac (Incision: Right Knee (Wound Vac Provena) )                       Personal Care Assistance Level of Assistance  Bathing, Feeding, Dressing Bathing Assistance: Limited assistance Feeding assistance: Independent Dressing Assistance: Limited assistance     Functional Limitations Info  Sight, Hearing, Speech Sight Info: Adequate Hearing Info: Adequate Speech Info: Adequate    SPECIAL CARE FACTORS FREQUENCY  PT (By licensed PT), OT (By licensed OT)     PT Frequency:  (5) OT Frequency:  (5)             Contractures      Additional Factors Info  Code Status, Allergies Code Status Info:  (Full Code. ) Allergies Info:  (Norco Hydrocodone-acetaminophen, Prozac Fluoxetine Hcl, Sulfa Antibiotics )           Current Medications (07/26/2016):  This is the current hospital active medication list Current Facility-Administered Medications  Medication Dose Route Frequency Provider Last Rate Last Dose  . 0.9 %  sodium chloride infusion   Intravenous Continuous Hessie Knows, MD      . acetaminophen (TYLENOL) tablet 650 mg  650 mg Oral Q6H PRN Hessie Knows, MD       Or  . acetaminophen (TYLENOL) suppository 650 mg  650 mg Rectal Q6H PRN Hessie Knows, MD      . ALPRAZolam Duanne Moron) tablet 0.125-0.25 mg  0.125-0.25 mg Oral QHS PRN Hessie Knows, MD      . aspirin EC tablet 81 mg  81 mg Oral Daily Hessie Knows, MD      . bisacodyl (DULCOLAX) suppository 10 mg  10 mg Rectal Daily PRN Hessie Knows, MD      . ceFAZolin (ANCEF) 2-4 GM/100ML-% IVPB           . ceFAZolin (ANCEF) IVPB 2g/100 mL premix  2 g Intravenous Q6H Hessie Knows, MD      . citalopram (CELEXA) tablet 20 mg  20 mg Oral Daily Hessie Knows, MD      . diphenhydrAMINE (BENADRYL)  12.5 MG/5ML elixir 12.5-25 mg  12.5-25 mg Oral Q4H PRN Hessie Knows, MD      . docusate sodium (COLACE) capsule 100 mg  100 mg Oral BID Hessie Knows, MD      . Derrill Memo ON 07/27/2016] enoxaparin (LOVENOX) injection 30 mg  30 mg Subcutaneous Q12H Hessie Knows, MD      . Derrill Memo ON 07/27/2016] levothyroxine (SYNTHROID, LEVOTHROID) tablet 75 mcg  75 mcg Oral QAC breakfast Hessie Knows, MD      . magnesium citrate solution 1 Bottle  1 Bottle Oral Once PRN Hessie Knows, MD      . magnesium hydroxide (MILK OF MAGNESIA) suspension 30 mL  30 mL Oral Daily PRN Hessie Knows, MD      . menthol-cetylpyridinium (CEPACOL) lozenge 3 mg  1 lozenge Oral PRN Hessie Knows, MD       Or  . phenol (CHLORASEPTIC) mouth spray 1 spray  1 spray Mouth/Throat PRN Hessie Knows, MD      .  methocarbamol (ROBAXIN) tablet 500 mg  500 mg Oral Q6H PRN Hessie Knows, MD       Or  . methocarbamol (ROBAXIN) 500 mg in dextrose 5 % 50 mL IVPB  500 mg Intravenous Q6H PRN Hessie Knows, MD      . metoCLOPramide (REGLAN) tablet 5-10 mg  5-10 mg Oral Q8H PRN Hessie Knows, MD       Or  . metoCLOPramide (REGLAN) injection 5-10 mg  5-10 mg Intravenous Q8H PRN Hessie Knows, MD      . metoCLOPramide (REGLAN) tablet 5 mg  5 mg Oral BID Hessie Knows, MD      . morphine 2 MG/ML injection 2 mg  2 mg Intravenous Q1H PRN Hessie Knows, MD      . nystatin cream (MYCOSTATIN) 1 application  1 application Topical BID Hessie Knows, MD      . ondansetron [facility]) tablet 4 mg  4 mg Oral Q6H PRN Hessie Knows, MD       Or  . ondansetron [facility]) injection 4 mg  4 mg Intravenous Q6H PRN Hessie Knows, MD      . oxyCODONE (Oxy IR/ROXICODONE) immediate release tablet 5-10 mg  5-10 mg Oral Q3H PRN Hessie Knows, MD      . Derrill Memo ON 07/27/2016] propranolol (INDERAL) tablet 20 mg  20 mg Oral Daily Hessie Knows, MD      . triamcinolone cream (KENALOG) 0.1 % 1 application  1 application Topical BID Hessie Knows, MD      . Derrill Memo ON 07/27/2016] triamterene-hydrochlorothiazide (MAXZIDE) 75-50 MG per tablet 0.5 tablet  0.5 tablet Oral Once per day on Tue Fri Hessie Knows, MD      . zolpidem [facility]) tablet 5 mg  5 mg Oral QHS PRN Hessie Knows, MD         Discharge Medications: Please see discharge summary for a list of discharge medications.  Relevant Imaging Results:  Relevant Lab Results:   Additional Information  (SSN: [id number])  Jovanka Westgate, Veronia Beets, LCSW

## 2016-07-26 NOTE — Anesthesia Procedure Notes (Signed)
Date/Time: 07/26/2016 12:23 PM Performed by: Nelda Marseille Pre-anesthesia Checklist: Patient identified, Emergency Drugs available, Suction available, Patient being monitored and Timeout performed Oxygen Delivery Method: Simple face mask

## 2016-07-26 NOTE — H&P (Signed)
Reviewed paper H+P, will be scanned into chart. No changes noted.  

## 2016-07-26 NOTE — Anesthesia Procedure Notes (Signed)
Spinal  Patient location during procedure: OR Start time: 07/26/2016 11:40 AM End time: 07/26/2016 11:47 AM Staffing Anesthesiologist: Marline Backbone F Resident/CRNA: Dionne Bucy Performed: resident/CRNA  Preanesthetic Checklist Completed: patient identified, site marked, surgical consent, pre-op evaluation, timeout performed, IV checked, risks and benefits discussed and monitors and equipment checked Spinal Block Patient position: sitting Prep: ChloraPrep Patient monitoring: heart rate, continuous pulse ox and blood pressure Approach: midline Location: L4-5 Injection technique: single-shot Needle Needle type: [place]  Needle gauge: 25 G Assessment Sensory level: T6

## 2016-07-26 NOTE — Anesthesia Postprocedure Evaluation (Signed)
Anesthesia Post Note  Patient: Erika Nelson  Procedure(s) Performed: Procedure(s) (LRB): TOTAL KNEE ARTHROPLASTY (Right)  Patient location during evaluation: PACU Anesthesia Type: Spinal Level of consciousness: awake Pain management: pain level controlled Vital Signs Assessment: post-procedure vital signs reviewed and stable Respiratory status: spontaneous breathing Cardiovascular status: stable Anesthetic complications: no    Last Vitals:  Vitals:   07/26/16 1435 07/26/16 1450  BP: (!) 144/68 140/82  Pulse: (!) 56 (!) 56  Resp: 14 13  Temp:  36.2 C    Last Pain:  Vitals:   07/26/16 0921  TempSrc: Tympanic                 VAN STAVEREN,Gaylynn Seiple

## 2016-07-26 NOTE — Anesthesia Preprocedure Evaluation (Signed)
Anesthesia Evaluation  Patient identified by MRN, date of birth, ID band Patient awake    Reviewed: Allergy & Precautions, NPO status , Patient's Chart, lab work & pertinent test results  Airway Mallampati: II       Dental  (+) Upper Dentures   Pulmonary neg pulmonary ROS,     + decreased breath sounds      Cardiovascular Exercise Tolerance: Good hypertension, Pt. on home beta blockers  Rhythm:Regular Rate:Normal     Neuro/Psych  Headaches, Anxiety    GI/Hepatic Neg liver ROS, GERD  ,  Endo/Other  Morbid obesity  Renal/GU negative Renal ROS     Musculoskeletal   Abdominal   Peds  Hematology   Anesthesia Other Findings   Reproductive/Obstetrics                             Anesthesia Physical Anesthesia Plan  ASA: III  Anesthesia Plan: Spinal   Post-op Pain Management:    Induction: Intravenous  Airway Management Planned: Natural Airway and Nasal Cannula  Additional Equipment:   Intra-op Plan:   Post-operative Plan:   Informed Consent: I have reviewed the patients History and Physical, chart, labs and discussed the procedure including the risks, benefits and alternatives for the proposed anesthesia with the patient or authorized representative who has indicated his/her understanding and acceptance.     Plan Discussed with: CRNA  Anesthesia Plan Comments:         Anesthesia Quick Evaluation

## 2016-07-27 LAB — BASIC METABOLIC PANEL
Anion gap: 8 (ref 5–15)
BUN: 17 mg/dL (ref 6–20)
CALCIUM: 8.5 mg/dL — AB (ref 8.9–10.3)
CO2: 23 mmol/L (ref 22–32)
CREATININE: 1.12 mg/dL — AB (ref 0.44–1.00)
Chloride: 102 mmol/L (ref 101–111)
GFR calc Af Amer: 55 mL/min — ABNORMAL LOW (ref >60)
GFR, EST NON AFRICAN AMERICAN: 47 mL/min — AB (ref >60)
GLUCOSE: 134 mg/dL — AB (ref 65–99)
Potassium: 4 mmol/L (ref 3.5–5.1)
Sodium: 133 mmol/L — ABNORMAL LOW (ref 135–145)

## 2016-07-27 LAB — CBC
HCT: 38.8 % (ref 35.0–47.0)
Hemoglobin: 13.5 g/dL (ref 12.0–16.0)
MCH: 30.5 pg (ref 26.0–34.0)
MCHC: 34.6 g/dL (ref 32.0–36.0)
MCV: 88 fL (ref 80.0–100.0)
PLATELETS: 159 10*3/uL (ref 150–440)
RBC: 4.42 MIL/uL (ref 3.80–5.20)
RDW: 13.1 % (ref 11.5–14.5)
WBC: 10.3 10*3/uL (ref 3.6–11.0)

## 2016-07-27 MED ORDER — TRIAMTERENE-HCTZ 37.5-25 MG PO TABS
1.0000 | ORAL_TABLET | ORAL | Status: DC
  Administered 2016-07-27: 1 via ORAL
  Filled 2016-07-27: qty 1

## 2016-07-27 MED ORDER — OXYCODONE HCL 5 MG PO TABS: 5.0000 mg | ORAL_TABLET | ORAL | 0 refills | Status: DC | PRN

## 2016-07-27 MED ORDER — ENOXAPARIN SODIUM 40 MG/0.4ML ~~LOC~~ SOLN: 30.0000 mg | SUBCUTANEOUS | 0 refills | Status: DC

## 2016-07-27 MED ORDER — TRIAMTERENE-HCTZ 37.5-25 MG PO CAPS
1.0000 | ORAL_CAPSULE | ORAL | Status: DC
  Filled 2016-07-27: qty 1

## 2016-07-27 NOTE — Clinical Social Work Note (Signed)
Clinical Social Work Assessment  Patient Details  Name: Erika Nelson MRN: [id number] Date of Birth: [birth year]  Date of referral:  07/27/16               Reason for consult:  Facility Placement                Permission sought to share information with:  Chartered certified accountant granted to share information::  Yes, Verbal Permission Granted  Name::      Erika Nelson::   [place]   Relationship::     Contact Information:     Housing/Transportation Living arrangements for the past 2 months:  [place] of Information:  Patient, Adult Children Patient Interpreter Needed:  None Criminal Activity/Legal Involvement Pertinent to Current Situation/Hospitalization:  No - Comment as needed Significant Relationships:  Adult Children Lives with:  Self Do you feel safe going back to the place where you live?  Yes Need for family participation in patient care:  Yes (Comment)  Care giving concerns:  Patient lives alone in [place].    Social Worker assessment / plan:  Holiday representative (CSW) received SNF consult. PT is recommending SNF. CSW met with patient and her daughter Erika Nelson and daughter in law Erika Nelson were at bedside. Patient was alert and oriented and was sitting up in the chair. CSW introduced self and explained role of CSW department. Per patient she lives alone in [place] and her daughter Erika Nelson lives next door. CSW explained that PT is recommending SNF. Patient and daughter are agreeable to SNF.   FL2 complete and faxed out. CSW presented bed offers to patient. Patient chose [facility]. Plan is for patient to D/C to [facility] tomorrow pending medical clearance. Per [facility] admissions coordinator at [facility] patient can be admitted to [facility] to room 503. [facility] authorization has been received. Auth # [id number]. CSW sent D/C orders to [facility] via [place] today. CSW will continue to follow and assist as needed.   Employment status:   Retired Nurse, adult PT Recommendations:  [place] / Referral to community resources:  [place]  Patient/Family's Response to care:  Patient and daughter are agreeable for patient to D/C to [facility].   Patient/Family's Understanding of and Emotional Response to Diagnosis, Current Treatment, and Prognosis:  Patient and daughter were pleasant and thanked CSW for visit.   Emotional Assessment Appearance:  Appears stated age Attitude/Demeanor/Rapport:    Affect (typically observed):  Accepting, Adaptable, Pleasant Orientation:  Oriented to Self, Oriented to Place, Oriented to Situation, Oriented to  Time Alcohol / Substance use:  Not Applicable Psych involvement (Current and /or in the community):  No (Comment)  Discharge Needs  Concerns to be addressed:  Discharge Planning Concerns Readmission within the last 30 days:  No Current discharge risk:  Dependent with Mobility Barriers to Discharge:  Continued Medical Work up   [employer], Veronia Beets, LCSW 07/27/2016, 4:17 PM

## 2016-07-27 NOTE — Progress Notes (Addendum)
   Subjective: 1 Day Post-Op Procedure(s) (LRB): TOTAL KNEE ARTHROPLASTY (Right) Patient reports pain as 6 on 0-10 scale.   Patient is well, and has had no acute complaints or problems Denies any CP, SOB, ABD pain. We will continue therapy today.  Plan is to go Skilled nursing facility after hospital stay.  Objective: Vital signs in last 24 hours: Temp:  [97.2 F (36.2 C)-98.4 F (36.9 C)] 98.4 F (36.9 C) (07/28 0551) Pulse Rate:  [50-72] 66 (07/28 0551) Resp:  [13-19] 18 (07/28 0551) BP: (94-174)/(46-84) 116/66 (07/28 0551) SpO2:  [95 %-100 %] 95 % (07/28 0551) Weight:  [99.8 kg (220 lb)] 99.8 kg (220 lb) (07/27 0921)  Intake/Output from previous day: 07/27 0701 - 07/28 0700 In: 3086.3 [P.O.:580; I.V.:2306.3; IV Piggyback:200] Out: 1200 [Urine:1150; Blood:50] Intake/Output this shift: No intake/output data recorded.   Recent Labs  07/26/16 0926 07/27/16 0627  HGB 15.0 13.5    Recent Labs  07/26/16 0926 07/27/16 0627  WBC 6.7 10.3  RBC 5.02 4.42  HCT 44.0 38.8  PLT 192 159    Recent Labs  07/26/16 0926 07/27/16 0627  NA  --  133*  K  --  4.0  CL  --  102  CO2  --  23  BUN  --  17  CREATININE 0.95 1.12*  GLUCOSE  --  134*  CALCIUM  --  8.5*   No results for input(s): LABPT, INR in the last 72 hours.  EXAM General - Patient is Alert, Appropriate and Oriented Extremity - Neurovascular intact Sensation intact distally Intact pulses distally Dorsiflexion/Plantar flexion intact Incision: dressing C/D/I, no drainage and wound vac intact with out drainage No cellulitis present Dressing - dressing C/D/I, no drainage and wound vac intact Motor Function - intact, moving foot and toes well on exam.   Past Medical History:  Diagnosis Date  . Anxiety   . Cervicalgia   . Diverticulosis   . Generalized OA    Osteoarthritis bilateral knees  . GERD (gastroesophageal reflux disease)   . Headache   . Hyperlipidemia   . Hypertension   . Occasional  tremors     Assessment/Plan:   1 Day Post-Op Procedure(s) (LRB): TOTAL KNEE ARTHROPLASTY (Right) Active Problems:   Osteoarthrosis, localized, primary, knee  hyponatremia  Estimated body mass index is 36.61 kg/m as calculated from the following:   Height as of this encounter: $RemoveBeforeD'5\' 5"'[id number]$  (1.651 m).   Weight as of this encounter: 99.8 kg (220 lb). Advance diet Up with therapy  Needs BM Recheck labs in the am, monitor Na DC IV fluids today CM to assist with discharge, patient will likely need SNF Wound vac will need to be removed and disposed of on 08/02/16. Apply Sterile dressing to knee. Follow up with [place] ortho in 2 weeks for staple removal.   DVT Prophylaxis - Lovenox, Foot Pumps and TED hose Weight-Bearing as tolerated to right leg   T. Rachelle Hora, PA-C [place] 07/27/2016, 8:04 AM

## 2016-07-27 NOTE — Evaluation (Signed)
Occupational Therapy Evaluation Patient Details Name: Erika Nelson MRN: [id number] DOB: 21-Jul-1941 Today's Date: 07/27/2016    History of Present Illness Pt is 75 year old female who had elective R TKA by Dr Rudene Christians with hx of osteoarthritis in both knees, HTN, anxiety with occasional tremors.   Clinical Impression   Pt is 75 year old female s/p R TKA  who lives at home with grand daughter and her 2 children ages 31 and 47. Pt was independent in all ADLs prior to surgery and is eager to return to PLOF.  Pt is currently limited in functional ADLs due to pain (currently 10/10)and  decreased ROM.  Pt requires maximum assist for LB dressing and bathing skills due to pain and decreased AROM of RLE  and would benefit from continued skilled OT services for education in assistive devices, functional mobility, and education in recommendations for home modifications to increase safety and prevent falls.  Pt is a good candidate for SNF to continue rehabilitation.      Follow Up Recommendations  SNF    Equipment Recommendations  Tub/shower seat;3 in 1 bedside comode (rec BSC over toilet)    Recommendations for Other Services       Precautions / Restrictions Precautions Precautions: Fall Restrictions Weight Bearing Restrictions: Yes RLE Weight Bearing: Weight bearing as tolerated      Mobility Bed Mobility                  Transfers                      Balance                                            ADL Overall ADL's : Needs assistance/impaired                                       General ADL Comments: Pt requires max assist for LB dressing and bathing skills due to pain and decreased AROM of RLE.  She is able to complete grooming, hygiene and feeding skills independently after set up but is limited in functional mobiltiy and ADLs due to pain 10/10.  She presented with cramping pain and noticed her ice pack on knee was not plugged  in and once it was, cramping started to decrease.  Reviewed and demonstrated sock aid and reacher for dressing skills but pt was not able to demonstrate teach back due to high pain levels.     Vision     Perception     Praxis      Pertinent Vitals/Pain Pain Assessment: 0-10 Pain Score: 10-Worst pain ever Pain Descriptors / Indicators: Aching;Constant;Cramping Pain Intervention(s): Limited activity within patient's tolerance;Monitored during session;Premedicated before session;Ice applied;Other (comment) (ice pack was not plugged in and when plugged in her cramping and pain in leg decreased)     Hand Dominance Right   Extremity/Trunk Assessment Upper Extremity Assessment Upper Extremity Assessment: Overall WFL for tasks assessed   Lower Extremity Assessment Lower Extremity Assessment: Defer to PT evaluation       Communication Communication Communication: No difficulties   Cognition Arousal/Alertness: Awake/alert Behavior During Therapy: WFL for tasks assessed/performed Overall Cognitive Status: Within Functional Limits for tasks assessed  General Comments       Exercises       Shoulder Instructions      Home Living Family/patient expects to be discharged to:: Private residence Living Arrangements: Alone;Children (lives with grandaughter and her 2 children ages 59 and 57) Available Help at Discharge: Family Type of Home: House Home Access: Stairs to enter Technical brewer of Steps: 3 Entrance Stairs-Rails: None Home Layout: One level     Bathroom Shower/Tub: Tub/shower unit Shower/tub characteristics: Architectural technologist: Standard Bathroom Accessibility: Yes How Accessible: Accessible via walker Home Equipment: Environmental consultant - standard          Prior Functioning/Environment Level of Independence: Independent             OT Diagnosis: Acute pain   OT Problem List: Decreased strength;Decreased range of  motion;Decreased activity tolerance;Decreased knowledge of use of DME or AE;Pain   OT Treatment/Interventions: Self-care/ADL training;DME and/or AE instruction;Patient/family education    OT Goals(Current goals can be found in the care plan section) Acute Rehab OT Goals Patient Stated Goal: "to get pain under control, it is 10/10" OT Goal Formulation: With patient/family Time For Goal Achievement: 08/10/16 Potential to Achieve Goals: Good ADL Goals Pt Will Perform Lower Body Dressing: with supervision;with adaptive equipment;sit to/from stand (using FWW for standing and no LOB using AD) Pt Will Transfer to Toilet: with supervision;stand pivot transfer;bedside commode (BSC over toilet)  OT Frequency: Min 1X/week   Barriers to D/C:            Co-evaluation              End of Session Nurse Communication: Other (comment) (updated about ice pack not plugged in but is now)  Activity Tolerance: Patient limited by pain Patient left: in bed;with call bell/phone within reach;with bed alarm set;with family/visitor present   Time: 1010-1035 OT Time Calculation (min): 25 min Charges:  OT General Charges $OT Visit: 1 Procedure OT Evaluation $OT Eval Moderate Complexity: 1 Procedure OT Treatments $Self Care/Home Management : 8-22 mins G-Codes:     Chrys Racer, OTR/L ascom [phone] 07/27/2016, 11:33 AM

## 2016-07-27 NOTE — Care Management Important Message (Signed)
Important Message  Patient Details  Name: Erika Nelson MRN: [id number] Date of Birth: 1941/10/20   Medicare Important Message Given:  Yes    Alvie Heidelberg, RN 07/27/2016, 4:06 PM

## 2016-07-27 NOTE — Evaluation (Signed)
Physical Therapy Evaluation Patient Details Name: Erika Nelson MRN: [id number] DOB: 01-12-1941 Today's Date: 07/27/2016   History of Present Illness  Pt is 75 year old female who had elective R TKA with hx of osteoarthritis in both knees, HTN, anxiety with occasional tremors.  Clinical Impression  Pt very pain limited and even on arrival and having had meds she reports 11/10 pain.  She shows good effort with most acts, but is very limited.  Pt apparently had significant genu valgus pre-surgery and has orders for pillow under knee X3 days (peroneal protection).  She struggles with ambulation/WBing and though she shows good effort with ~25 minutes of exercises with LEs she has a lot of pain and weakness with the R and needs excessive time and encouragement secondary to pain.  Pt has good family support and they were actively cheering for her with exercises and gait (needing max assist just to remain upright with attempts at minimal weight-shift/"stepping"), pt will need rehab after hospitalization.      Follow Up Recommendations SNF    Equipment Recommendations  Rolling walker with 5" wheels    Recommendations for Other Services       Precautions / Restrictions Precautions Precautions: Fall Restrictions Weight Bearing Restrictions: Yes RLE Weight Bearing: Weight bearing as tolerated Other Position/Activity Restrictions: pt to have pillow BEHIND R knee for 3 days (peroneal nerver protection)      Mobility  Bed Mobility Overal bed mobility: Needs Assistance Bed Mobility: Supine to Sit     Supine to sit: Mod assist     General bed mobility comments: Pt shows good effort with using UEs on rails, but overall is weak and pain limited needing considerable assist to get to EOB  Transfers Overall transfer level: Needs assistance Equipment used: Rolling walker (2 wheeled) Transfers: Sit to/from Stand Sit to Stand: Mod assist;Max assist         General transfer comment: Pt is able  to assist to getting to standing using UEs, but ultimately is hesistant and pain limited and needs a lot of both encouragement and assist to rise and maintain standing.   Ambulation/Gait Ambulation/Gait assistance: Total assist Ambulation Distance (Feet): 2 Feet Assistive device: Rolling walker (2 wheeled)       General Gait Details: Pt is unable to truly step with with either LE but does manage to shuffle feet minimally with great effort, excessive assist (to unweight and to shift weight) and a lot of encouragement and cuing from PT and family.  Pt is not at all safe with standing/ambulation.   Stairs            Wheelchair Mobility    Modified Rankin (Stroke Patients Only)       Balance                                             Pertinent Vitals/Pain Pain Assessment: 0-10 Pain Score: 10-Worst pain ever Pain Descriptors / Indicators: Aching;Constant;Cramping Pain Intervention(s): Limited activity within patient's tolerance;Monitored during session;Premedicated before session;Ice applied;Other (comment) (ice pack was not plugged in and when plugged in her cramping and pain in leg decreased)    Home Living Family/patient expects to be discharged to:: Assisted living Living Arrangements: Alone Available Help at Discharge: Family Type of Home: House Home Access: Stairs to enter Entrance Stairs-Rails: None Entrance Stairs-Number of Steps: 3 Home Layout: One level  Home Equipment: Walker - standard      Prior Function Level of Independence: Independent               Hand Dominance   Dominant Hand: Right    Extremity/Trunk Assessment   Upper Extremity Assessment: Overall WFL for tasks assessed           Lower Extremity Assessment: Generalized weakness;RLE deficits/detail RLE Deficits / Details: Pt displays grossly 3+/5 strength on L, very little tolerance to movement on the R - grossly 3-/5 with pain and strength limitations        Communication   Communication: No difficulties  Cognition Arousal/Alertness: Awake/alert Behavior During Therapy: WFL for tasks assessed/performed Overall Cognitive Status: Within Functional Limits for tasks assessed                      General Comments      Exercises Total Joint Exercises Ankle Circles/Pumps: AROM;PROM;10 reps Quad Sets: Strengthening;10 reps Gluteal Sets: Strengthening;10 reps Short Arc Quad: AAROM;5 reps Heel Slides: 5 reps;PROM;AAROM Hip ABduction/ADduction: AAROM;5 reps Knee Flexion: PROM;5 reps Goniometric ROM: 10-57      Assessment/Plan    PT Assessment Patient needs continued PT services  PT Diagnosis Difficulty walking;Generalized weakness;Acute pain   PT Problem List Decreased strength;Decreased range of motion;Decreased activity tolerance;Decreased balance;Decreased mobility;Decreased knowledge of use of DME;Decreased safety awareness  PT Treatment Interventions Gait training;DME instruction;Functional mobility training;Therapeutic activities;Balance training;Therapeutic exercise;Patient/family education   PT Goals (Current goals can be found in the Care Plan section) Acute Rehab PT Goals Patient Stated Goal: get the pain down PT Goal Formulation: With patient Time For Goal Achievement: 08/04/16 Potential to Achieve Goals: Fair    Frequency BID   Barriers to discharge        Co-evaluation               End of Session Equipment Utilized During Treatment: Gait belt Activity Tolerance: Patient limited by pain Patient left: with chair alarm set;with call bell/phone within reach           Time: 6825-7493 PT Time Calculation (min) (ACUTE ONLY): 51 min   Charges:   PT Evaluation $PT Eval Low Complexity: 1 Procedure PT Treatments $Therapeutic Exercise: 23-37 mins   PT G Codes:        Kreg Shropshire, DPT 07/27/2016, 1:48 PM

## 2016-07-27 NOTE — Progress Notes (Signed)
Physical Therapy Treatment Patient Details Name: Erika Nelson MRN: [id number] DOB: 07/04/1941 Today's Date: 07/27/2016    History of Present Illness Pt is 75 year old female who had elective R TKA with hx of osteoarthritis in both knees, HTN, anxiety with occasional tremors.    PT Comments    Pt continues to have a lot of pain and is limited with mobility, but she does much better with all aspects of PT this afternoon.  She was actually able to do AROM SLRs on the R (knee remains bent), was able to take a few steps with walker w/o excessive assist and had PROM knee flexion >60.  She is motivated to work with PT despite her pain/discomfort.    Follow Up Recommendations  SNF     Equipment Recommendations       Recommendations for Other Services       Precautions / Restrictions Precautions Precautions: Fall Restrictions RLE Weight Bearing: Weight bearing as tolerated Other Position/Activity Restrictions: pt to have pillow BEHIND R knee for 3 days (peroneal nerver protection)    Mobility  Bed Mobility Overal bed mobility: Needs Assistance Bed Mobility: Supine to Sit     Supine to sit: Mod assist     General bed mobility comments: Pt needs assist to get LEs into bed, but does show good effort scooting up in bed.  Transfers Overall transfer level: Needs assistance Equipment used: Rolling walker (2 wheeled) Transfers: Sit to/from Stand Sit to Stand: Mod assist         General transfer comment: Pt better able to shift weight forward this afternoon and uses L LE well to get up  Ambulation/Gait Ambulation/Gait assistance: Mod assist Ambulation Distance (Feet): 3 Feet Assistive device: Rolling walker (2 wheeled)       General Gait Details: Pt does a lot better with ability to unweight and take small (but actual foot clearing) steps.  She is still very cautious and guarded, but overall did not need excessive assist just to stay upright like this AM.   Stairs             Wheelchair Mobility    Modified Rankin (Stroke Patients Only)       Balance                                    Cognition Arousal/Alertness: Awake/alert Behavior During Therapy: WFL for tasks assessed/performed Overall Cognitive Status: Within Functional Limits for tasks assessed                      Exercises Total Joint Exercises Ankle Circles/Pumps: AROM;PROM;10 reps Quad Sets: Strengthening;10 reps Gluteal Sets: Strengthening;10 reps Short Arc Quad: AAROM;5 reps Heel Slides: 5 reps;PROM;AAROM Hip ABduction/ADduction: 10 reps;AROM Straight Leg Raises: 10 reps;AROM (unable to attain TKE, but did raise full LE against gravity) Knee Flexion: 5 reps;PROM    General Comments        Pertinent Vitals/Pain Pain Assessment: 0-10 Pain Score: 10-Worst pain ever    Home Living                      Prior Function            PT Goals (current goals can now be found in the care plan section) Progress towards PT goals: Progressing toward goals    Frequency  BID    PT Plan Current plan remains  appropriate    Co-evaluation             End of Session Equipment Utilized During Treatment: Gait belt Activity Tolerance: Patient limited by pain Patient left: with bed alarm set;with call bell/phone within reach;with family/visitor present     Time: 1855-0158 PT Time Calculation (min) (ACUTE ONLY): 27 min  Charges:  $Gait Training: 8-22 mins $Therapeutic Exercise: 8-22 mins                    G Codes:      Kreg Shropshire, DPT 07/27/2016, 5:22 PM

## 2016-07-27 NOTE — Clinical Social Work Placement (Signed)
   CLINICAL SOCIAL WORK PLACEMENT  NOTE  Date:  07/27/2016  Patient Details  Name: ALIMA NASER MRN: [id number] Date of Birth: 01/07/1941  Clinical Social Work is seeking post-discharge placement for this patient at the [place] level of care (*CSW will initial, date and re-position this form in  chart as items are completed):  Yes   Patient/family provided with [place] Work Department's list of facilities offering this level of care within the geographic area requested by the patient (or if unable, by the patient's family).  Yes   Patient/family informed of their freedom to choose among providers that offer the needed level of care, that participate in Medicare, Medicaid or managed care program needed by the patient, have an available bed and are willing to accept the patient.  Yes   Patient/family informed of [place]'s ownership interest in [facility] and [facility], as well as of the fact that they are under no obligation to receive care at these facilities.  PASRR submitted to EDS on 07/27/16     PASRR number received on 07/27/16     Existing PASRR number confirmed on       FL2 transmitted to all facilities in geographic area requested by pt/family on 07/27/16     FL2 transmitted to all facilities within larger geographic area on       Patient informed that his/her managed care company has contracts with or will negotiate with certain facilities, including the following:        Yes   Patient/family informed of bed offers received.  Patient chooses bed at  [facility] )     Physician recommends and patient chooses bed at      Patient to be transferred to   on  .  Patient to be transferred to facility by       Patient family notified on   of transfer.  Name of family member notified:        PHYSICIAN       Additional Comment:    _______________________________________________ Makeba Delcastillo, Veronia Beets,  LCSW 07/27/2016, 4:16 PM

## 2016-07-27 NOTE — Discharge Summary (Addendum)
Physician Discharge Summary  Patient ID: Erika Nelson MRN: [id number] DOB/AGE: Jul 11, 1941 75 y.o.  Admit date: 07/26/2016 Discharge date: 07/29/2016 Admission Diagnoses:  OSTEOARTHRITIS   Discharge Diagnoses: Patient Active Problem List   Diagnosis Date Noted  . Osteoarthrosis, localized, primary, knee 07/26/2016    Past Medical History:  Diagnosis Date  . Anxiety   . Cervicalgia   . Diverticulosis   . Generalized OA    Osteoarthritis bilateral knees  . GERD (gastroesophageal reflux disease)   . Headache   . Hyperlipidemia   . Hypertension   . Occasional tremors      Transfusion: none   Consultants (if any):   Discharged Condition: Improved  Hospital Course: Erika Nelson is an 75 y.o. female who was admitted 07/26/2016 with a diagnosis of right knee osteoarthritis and went to the operating room on 07/26/2016 and underwent the above named procedures.    Surgeries: Procedure(s): TOTAL KNEE ARTHROPLASTY on 07/26/2016 Patient tolerated the surgery well. Taken to PACU where she was stabilized and then transferred to the orthopedic floor.  Started on Lovenox 30 q 12 hrs. Foot pumps applied bilaterally at 80 mm. Heels elevated on bed with rolled towels. No evidence of DVT. Negative Homan. Physical therapy started on day #1 for gait training and transfer. OT started day #1 for ADL and assisted devices.  Patient's foley was d/c on day #1. Patient's IV  was d/c on day #2.  On post op day #2 patient was stable and ready for discharge to SNF.  Implants:  Medacta GMK right 4 femur with 3 tibia and 12 mm PS insert, to patella, all components cemented  She was given perioperative antibiotics:  Anti-infectives    Start     Dose/Rate Route Frequency Ordered Stop   07/26/16 1800  ceFAZolin (ANCEF) IVPB 2g/100 mL premix     2 g 200 mL/hr over 30 Minutes Intravenous Every 6 hours 07/26/16 1520 07/27/16 0759   07/26/16 0845  vancomycin (VANCOCIN) 500 mg in sodium chloride 0.9 % 100  mL IVPB     500 mg 100 mL/hr over 60 Minutes Intravenous  Once 07/26/16 0830 07/26/16 1217   07/26/16 0833  ceFAZolin (ANCEF) 2-4 GM/100ML-% IVPB    Comments:  Ronnell Freshwater: cabinet override      07/26/16 0833 07/26/16 2044   07/26/16 0500  ceFAZolin (ANCEF) IVPB 2g/100 mL premix     2 g 200 mL/hr over 30 Minutes Intravenous  Once 07/26/16 0445 07/26/16 1208    .  She was given sequential compression devices, early ambulation, and lovenox for DVT prophylaxis.  She benefited maximally from the hospital stay and there were no complications.    Recent vital signs:  Vitals:   07/26/16 2306 07/27/16 0551  BP: 109/60 116/66  Pulse: (!) 50 66  Resp: 18 18  Temp: 97.8 F (36.6 C) 98.4 F (36.9 C)    Recent laboratory studies:  Lab Results  Component Value Date   HGB 13.5 07/27/2016   HGB 15.0 07/26/2016   HGB 15.6 07/09/2016   Lab Results  Component Value Date   WBC 10.3 07/27/2016   PLT 159 07/27/2016   Lab Results  Component Value Date   INR 1.10 07/09/2016   Lab Results  Component Value Date   NA 133 (L) 07/27/2016   K 4.0 07/27/2016   CL 102 07/27/2016   CO2 23 07/27/2016   BUN 17 07/27/2016   CREATININE 1.12 (H) 07/27/2016   GLUCOSE 134 (H) 07/27/2016  Discharge Medications:     Medication List    STOP taking these medications   traMADol 50 MG tablet Commonly known as:  ULTRAM     TAKE these medications   ALPRAZolam 0.25 MG tablet Commonly known as:  XANAX Take 0.125-0.25 mg by mouth at bedtime as needed for sleep.   aspirin EC 81 MG tablet Take 81 mg by mouth daily.   citalopram 20 MG tablet Commonly known as:  CELEXA Take 20 mg by mouth daily.   enoxaparin 40 MG/0.4ML injection Commonly known as:  LOVENOX Inject 0.3 mLs (30 mg total) into the skin daily.   levothyroxine 75 MCG tablet Commonly known as:  SYNTHROID, LEVOTHROID Take 75 mcg by mouth daily before breakfast.   meloxicam 7.5 MG tablet Commonly known as:  MOBIC Take 7.5  mg by mouth at bedtime.   metoCLOPramide 5 MG tablet Commonly known as:  REGLAN Take 5 mg by mouth 2 (two) times daily.   nystatin cream Commonly known as:  MYCOSTATIN Apply 1 application topically 2 (two) times daily.   oxyCODONE 5 MG immediate release tablet Commonly known as:  Oxy IR/ROXICODONE Take 1-2 tablets (5-10 mg total) by mouth every 3 (three) hours as needed for breakthrough pain.   propranolol 40 MG tablet Commonly known as:  INDERAL Take 20 mg by mouth daily. tremors   triamcinolone cream 0.1 % Commonly known as:  KENALOG Apply 1 application topically 2 (two) times daily.   triamterene-hydrochlorothiazide 75-50 MG tablet Commonly known as:  MAXZIDE Take 0.5 tablets by mouth 2 (two) times a week. Tuesday and Friday       Diagnostic Studies: Dg Knee 1-2 Views Right  Result Date: 07/26/2016 CLINICAL DATA:  Status post right knee arthroplasty. EXAM: RIGHT KNEE - 1-2 VIEW COMPARISON:  None. FINDINGS: The prosthetic components are well-seated and well-aligned. There is no acute fracture or evidence of an operative complication. IMPRESSION: Well-positioned right knee arthroplasty. Electronically Signed   By: Lajean Manes M.D.   On: 07/26/2016 14:36   Disposition: 01-Home or Self Care      Follow-up Information    MENZ,MICHAEL, MD. Schedule an appointment as soon as possible for a visit in 2 week(s).   Specialty:  Orthopedic Surgery Why:  for staple removal and steri strip application Contact information: [place] [phone]          Please remove wound VAC on 08/02/2016 and apply sterile dressing to the knee. Patient will follow-up with [facility] clinic orthopedics in 2 weeks for staple removal and Steri-Strip application. Please keep wound VAC charged.   Signed: Dorise Hiss CHRISTOPHER 07/27/2016, 12:21 PM

## 2016-07-27 NOTE — Discharge Instructions (Signed)

## 2016-07-28 LAB — BASIC METABOLIC PANEL
Anion gap: 7 (ref 5–15)
BUN: 15 mg/dL (ref 6–20)
CO2: 24 mmol/L (ref 22–32)
Calcium: 8.8 mg/dL — ABNORMAL LOW (ref 8.9–10.3)
Chloride: 100 mmol/L — ABNORMAL LOW (ref 101–111)
Creatinine, Ser: 1.11 mg/dL — ABNORMAL HIGH (ref 0.44–1.00)
GFR calc Af Amer: 55 mL/min — ABNORMAL LOW (ref >60)
GFR, EST NON AFRICAN AMERICAN: 48 mL/min — AB (ref >60)
Glucose, Bld: 154 mg/dL — ABNORMAL HIGH (ref 65–99)
POTASSIUM: 3.9 mmol/L (ref 3.5–5.1)
SODIUM: 131 mmol/L — AB (ref 135–145)

## 2016-07-28 LAB — CBC
HCT: 37.9 % (ref 35.0–47.0)
Hemoglobin: 13.4 g/dL (ref 12.0–16.0)
MCH: 30.7 pg (ref 26.0–34.0)
MCHC: 35.4 g/dL (ref 32.0–36.0)
MCV: 86.6 fL (ref 80.0–100.0)
PLATELETS: 144 10*3/uL — AB (ref 150–440)
RBC: 4.38 MIL/uL (ref 3.80–5.20)
RDW: 12.8 % (ref 11.5–14.5)
WBC: 13.2 10*3/uL — AB (ref 3.6–11.0)

## 2016-07-28 MED ORDER — LACTULOSE 10 GM/15ML PO SOLN
10.0000 g | Freq: Two times a day (BID) | ORAL | Status: DC | PRN
  Administered 2016-07-28: 10 g via ORAL
  Filled 2016-07-28: qty 30

## 2016-07-28 NOTE — Progress Notes (Signed)
Physical Therapy Treatment Patient Details Name: Erika Nelson MRN: [id number] DOB: Mar 05, 1941 Today's Date: 07/28/2016    History of Present Illness Pt is 75 year old female who had elective R TKA with hx of osteoarthritis in both knees, HTN, anxiety with occasional tremors.    PT Comments    Pt shows good effort with PT today despite her pain.  She was actually able to take a few steps/ambulate and though she fatigues and is very limited she did much better.  Pt able to do some AAROM SLR and with the last 3 reps was able to do them actively.  Pt with increased ROM and mobility, showing consistent but slow improvement.  Follow Up Recommendations  SNF     Equipment Recommendations       Recommendations for Other Services       Precautions / Restrictions Precautions Precautions: Fall Restrictions RLE Weight Bearing: Weight bearing as tolerated    Mobility  Bed Mobility Overal bed mobility: Needs Assistance       Supine to sit: Mod assist     General bed mobility comments: Pt shows better effort with getting to EOB today and though she still needs considerable assist and encouragement.  Transfers Overall transfer level: Needs assistance Equipment used: Rolling walker (2 wheeled) Transfers: Sit to/from Stand Sit to Stand: Min assist         General transfer comment: Pt did well with getting to standing today.  She needs a lot of assist to set up and heavy encouragement but ultimately she was able to rise w/ less assist today.  Ambulation/Gait Ambulation/Gait assistance: Mod assist Ambulation Distance (Feet): 6 Feet Assistive device: Rolling walker (2 wheeled)       General Gait Details: Pt was actually able to take some real steps today and though it was still very limited and guarded.  She is heavy reliant on UEs, the walker, and becomes very fatigued with the effort.     Stairs            Wheelchair Mobility    Modified Rankin (Stroke Patients  Only)       Balance                                    Cognition Arousal/Alertness: Awake/alert Behavior During Therapy: WFL for tasks assessed/performed Overall Cognitive Status: Within Functional Limits for tasks assessed                      Exercises Total Joint Exercises Ankle Circles/Pumps: 10 reps;PROM;AROM Quad Sets: 15 reps;Strengthening Gluteal Sets: 15 reps;Strengthening Heel Slides: 5 reps;PROM;AAROM Hip ABduction/ADduction: 10 reps;AROM Straight Leg Raises: AAROM;10 reps Long Arc Quad: 10 reps;AAROM;AROM Knee Flexion: 10 reps;PROM Goniometric ROM: 5-81    General Comments        Pertinent Vitals/Pain Pain Assessment: 0-10 Pain Score: 2  (pain increases dramatically with any movement) Pain Location: R knee    Home Living                      Prior Function            PT Goals (current goals can now be found in the care plan section) Progress towards PT goals: Progressing toward goals    Frequency  BID    PT Plan Current plan remains appropriate    Co-evaluation  End of Session Equipment Utilized During Treatment: Gait belt Activity Tolerance: Patient limited by pain Patient left: with chair alarm set;with call bell/phone within reach;with family/visitor present     Time: 9166-0600 PT Time Calculation (min) (ACUTE ONLY): 47 min  Charges:  $Gait Training: 8-22 mins $Therapeutic Exercise: 8-22 mins $Therapeutic Activity: 8-22 mins                    G Codes:      Kreg Shropshire, DPT 07/28/2016, 12:27 PM

## 2016-07-28 NOTE — Progress Notes (Signed)
Physical Therapy Treatment Patient Details Name: Erika Nelson MRN: [id number] DOB: Aug 26, 1941 Today's Date: 07/28/2016    History of Present Illness Pt is 75 year old female who had elective R TKA with hx of osteoarthritis in both knees, HTN, anxiety with occasional tremors.    PT Comments    Pt shows very good effort t/o the session, but is tired, pain limited and anxious.  She has a very hard time getting to upright and maintaining upright with ambulation.  She is able to get to >80 degrees of flexion, and after some assist with SLRs she is able to do a few with AROM with great effort.  Pt lethargic but has good attitude and does work hard with all acts.   Follow Up Recommendations  SNF     Equipment Recommendations       Recommendations for Other Services       Precautions / Restrictions Precautions Precautions: Fall Restrictions RLE Weight Bearing: Weight bearing as tolerated    Mobility  Bed Mobility Overal bed mobility: Needs Assistance Bed Mobility: Sit to Supine       Sit to supine: Mod assist   General bed mobility comments: Pt still needing a lot of assist with getting LEs into the bed  Transfers Overall transfer level: Needs assistance Equipment used: Rolling walker (2 wheeled) Transfers: Sit to/from Stand Sit to Stand: Mod assist         General transfer comment: Pt struggles getting up standing, and does need a lot of assist to shift weight forward.  She is anxious and unable to efficiently use UEs to get to standing.  Ambulation/Gait Ambulation/Gait assistance: Mod assist Ambulation Distance (Feet): 8 Feet Assistive device: Rolling walker (2 wheeled)       General Gait Details: Pt very much struggles with ambulation and is heavily reliant on the walker and shows poor posture and fatigues excessively with minimal distance ambulation.  She shows great effort but is very limited.   Stairs            Wheelchair Mobility    Modified  Rankin (Stroke Patients Only)       Balance                                    Cognition Arousal/Alertness: Lethargic Behavior During Therapy: WFL for tasks assessed/performed Overall Cognitive Status: Within Functional Limits for tasks assessed                      Exercises Total Joint Exercises Ankle Circles/Pumps: 10 reps;PROM;AROM Quad Sets: 15 reps;Strengthening Gluteal Sets: 15 reps;Strengthening Short Arc Quad: 10 reps;AROM;AAROM Heel Slides: 5 reps;PROM;AAROM Hip ABduction/ADduction: 10 reps;AROM Straight Leg Raises: 5 reps;AROM;AAROM Knee Flexion: 5 reps;PROM    General Comments        Pertinent Vitals/Pain Pain Assessment: 0-10 Pain Score: 6  Pain Location: R knee    Home Living                      Prior Function            PT Goals (current goals can now be found in the care plan section) Progress towards PT goals: Progressing toward goals    Frequency  BID    PT Plan Current plan remains appropriate    Co-evaluation             End  of Session Equipment Utilized During Treatment: Gait belt Activity Tolerance: Patient limited by pain Patient left: with call bell/phone within reach;with family/visitor present;with bed alarm set     Time: 0940-7680 PT Time Calculation (min) (ACUTE ONLY): 26 min  Charges:  $Gait Training: 8-22 mins $Therapeutic Exercise: 8-22 mins                    G Codes:      Kreg Shropshire, DPT 07/28/2016, 5:30 PM

## 2016-07-28 NOTE — Progress Notes (Signed)
   Subjective: 2 Days Post-Op Procedure(s) (LRB): TOTAL KNEE ARTHROPLASTY (Right) Patient reports pain as moderate.   Patient is well, and has had no acute complaints or problems Continue with physical therapy today.  Plan is to go Rehab after hospital stay. no nausea and no vomiting Patient denies any chest pains or shortness of breath. Objective: Vital signs in last 24 hours: Temp:  [98.4 F (36.9 C)-99 F (37.2 C)] 98.5 F (36.9 C) (07/29 0746) Pulse Rate:  [70-80] 80 (07/29 0746) Resp:  [18] 18 (07/29 0746) BP: (141-154)/(65-81) 145/73 (07/29 0746) SpO2:  [95 %-97 %] 96 % (07/29 0746) well approximated incision Heels are non tender and elevated off the bed using rolled towels Intake/Output from previous day: 07/28 0701 - 07/29 0700 In: 480 [P.O.:480] Out: -  Intake/Output this shift: Total I/O In: -  Out: 100 [Urine:100]   Recent Labs  07/26/16 0926 07/27/16 0627 07/28/16 0356  HGB 15.0 13.5 13.4    Recent Labs  07/27/16 0627 07/28/16 0356  WBC 10.3 13.2*  RBC 4.42 4.38  HCT 38.8 37.9  PLT 159 144*    Recent Labs  07/27/16 0627 07/28/16 0356  NA 133* 131*  K 4.0 3.9  CL 102 100*  CO2 23 24  BUN 17 15  CREATININE 1.12* 1.11*  GLUCOSE 134* 154*  CALCIUM 8.5* 8.8*   No results for input(s): LABPT, INR in the last 72 hours.  EXAM General - Patient is Alert, Appropriate and Oriented Extremity - Neurologically intact Neurovascular intact Sensation intact distally Intact pulses distally Compartment soft Dressing - dressing C/D/I Motor Function - intact, moving foot and toes well on exam.    Past Medical History:  Diagnosis Date  . Anxiety   . Cervicalgia   . Diverticulosis   . Generalized OA    Osteoarthritis bilateral knees  . GERD (gastroesophageal reflux disease)   . Headache   . Hyperlipidemia   . Hypertension   . Occasional tremors     Assessment/Plan: 2 Days Post-Op Procedure(s) (LRB): TOTAL KNEE ARTHROPLASTY  (Right) Active Problems:   Osteoarthrosis, localized, primary, knee  Estimated body mass index is 36.61 kg/m as calculated from the following:   Height as of this encounter: $RemoveBeforeD'5\' 5"'[id number]$  (1.651 m).   Weight as of this encounter: 99.8 kg (220 lb). Up with therapy Plan for discharge tomorrow Discharge to SNF  Labs: reviewed DVT Prophylaxis - Lovenox, Foot Pumps and TED hose Weight-Bearing as tolerated to right leg Lactulose ordered today. Bone foam ordered for right leg  Ainslee Sou R. Short Pump [place] 07/28/2016, 7:48 AM

## 2016-07-28 NOTE — Care Management Important Message (Signed)
Important Message  Patient Details  Name: Erika Nelson MRN: [id number] Date of Birth: 1941-03-17   Medicare Important Message Given:  Yes    Colen Eltzroth A, RN 07/28/2016, 3:20 PM

## 2016-07-29 DIAGNOSIS — E039 Hypothyroidism, unspecified: Secondary | ICD-10-CM | POA: Diagnosis not present

## 2016-07-29 DIAGNOSIS — J309 Allergic rhinitis, unspecified: Secondary | ICD-10-CM | POA: Diagnosis not present

## 2016-07-29 DIAGNOSIS — Z471 Aftercare following joint replacement surgery: Secondary | ICD-10-CM | POA: Diagnosis not present

## 2016-07-29 DIAGNOSIS — M542 Cervicalgia: Secondary | ICD-10-CM | POA: Diagnosis not present

## 2016-07-29 DIAGNOSIS — Z96651 Presence of right artificial knee joint: Secondary | ICD-10-CM | POA: Diagnosis not present

## 2016-07-29 DIAGNOSIS — E785 Hyperlipidemia, unspecified: Secondary | ICD-10-CM | POA: Diagnosis not present

## 2016-07-29 DIAGNOSIS — F329 Major depressive disorder, single episode, unspecified: Secondary | ICD-10-CM | POA: Diagnosis not present

## 2016-07-29 DIAGNOSIS — F419 Anxiety disorder, unspecified: Secondary | ICD-10-CM | POA: Diagnosis not present

## 2016-07-29 DIAGNOSIS — Z7401 Bed confinement status: Secondary | ICD-10-CM | POA: Diagnosis not present

## 2016-07-29 DIAGNOSIS — R251 Tremor, unspecified: Secondary | ICD-10-CM | POA: Diagnosis not present

## 2016-07-29 DIAGNOSIS — M25569 Pain in unspecified knee: Secondary | ICD-10-CM | POA: Diagnosis not present

## 2016-07-29 DIAGNOSIS — M159 Polyosteoarthritis, unspecified: Secondary | ICD-10-CM | POA: Diagnosis not present

## 2016-07-29 DIAGNOSIS — I10 Essential (primary) hypertension: Secondary | ICD-10-CM | POA: Diagnosis not present

## 2016-07-29 DIAGNOSIS — M150 Primary generalized (osteo)arthritis: Secondary | ICD-10-CM | POA: Diagnosis not present

## 2016-07-29 NOTE — Clinical Social Work Note (Signed)
Patient d/c to [name] via EMS. Patient and daughter Erika Nelson [phone]) aware of d/c plan.   Santiago Bumpers, MSW, LCSW-A [phone]

## 2016-07-29 NOTE — Progress Notes (Signed)
Report called to Freda Munro at [facility].

## 2016-07-29 NOTE — Clinical Social Work Placement (Signed)
   CLINICAL SOCIAL WORK PLACEMENT  NOTE  Date:  07/29/2016  Patient Details  Name: Erika Nelson MRN: [id number] Date of Birth: May 11, 1941  Clinical Social Work is seeking post-discharge placement for this patient at the [place] level of care (*CSW will initial, date and re-position this form in  chart as items are completed):  Yes   Patient/family provided with [place] Work Department's list of facilities offering this level of care within the geographic area requested by the patient (or if unable, by the patient's family).  Yes   Patient/family informed of their freedom to choose among providers that offer the needed level of care, that participate in Medicare, Medicaid or managed care program needed by the patient, have an available bed and are willing to accept the patient.  Yes   Patient/family informed of [facility]'s ownership interest in [facility] and [facility], as well as of the fact that they are under no obligation to receive care at these facilities.  PASRR submitted to EDS on 07/27/16     PASRR number received on 07/27/16     Existing PASRR number confirmed on       FL2 transmitted to all facilities in geographic area requested by pt/family on 07/27/16     FL2 transmitted to all facilities within larger geographic area on       Patient informed that his/her managed care company has contracts with or will negotiate with certain facilities, including the following:        Yes   Patient/family informed of bed offers received.  Patient chooses bed at  [facility] )     Physician recommends and patient chooses bed at  [facility])    Patient to be transferred to  ([facility]) on 07/29/16.  Patient to be transferred to facility by  [facility])     Patient family notified on 07/29/16 of transfer.  Name of family member notified:   ([place] )     PHYSICIAN       Additional Comment:     _______________________________________________ Zettie Pho, LCSW 07/29/2016, 9:31 AM

## 2016-07-29 NOTE — Progress Notes (Signed)
EMS here to transport pt. 

## 2016-07-29 NOTE — Clinical Social Work Placement (Deleted)
   CLINICAL SOCIAL WORK PLACEMENT  NOTE  Date:  07/29/2016  Patient Details  Name: Erika Nelson MRN: [id number] Date of Birth: June 05, 1941  Clinical Social Work is seeking post-discharge placement for this patient at the [place] level of care (*CSW will initial, date and re-position this form in  chart as items are completed):  Yes   Patient/family provided with [place] Work Department's list of facilities offering this level of care within the geographic area requested by the patient (or if unable, by the patient's family).  Yes   Patient/family informed of their freedom to choose among providers that offer the needed level of care, that participate in Medicare, Medicaid or managed care program needed by the patient, have an available bed and are willing to accept the patient.  Yes   Patient/family informed of [facility]'s ownership interest in [facility] and [facility], as well as of the fact that they are under no obligation to receive care at these facilities.  PASRR submitted to EDS on 07/27/16     PASRR number received on 07/27/16     Existing PASRR number confirmed on       FL2 transmitted to all facilities in geographic area requested by pt/family on 07/27/16     FL2 transmitted to all facilities within larger geographic area on       Patient informed that his/her managed care company has contracts with or will negotiate with certain facilities, including the following:        Yes   Patient/family informed of bed offers received.  Patient chooses bed at  [facility] )     Physician recommends and patient chooses bed at     [place] Patient to be transferred to   on  .  Patient to be transferred to facility by     EMS  Patient family notified on  07/29/2016 of transfer.   Name of family member notified:   Tammy  PHYSICIAN       Additional Comment:     _______________________________________________ Zettie Pho, LCSW 07/29/2016, 9:28 AM

## 2016-07-29 NOTE — Progress Notes (Signed)
Physical Therapy Treatment Patient Details Name: VIANA SLEEP MRN: [id number] DOB: March 02, 1941 Today's Date: 07/29/2016    History of Present Illness Pt is 75 year old female who had elective R TKA with hx of osteoarthritis in both knees, HTN, anxiety with occasional tremors.    PT Comments    Pt had her best PT session yet and though she still has a lot of pain with all acts and generally needs a lot of cuing she was able to do some SLRs and actually ambulate without needing excessive assist to unweight LEs or move the walker.  Pt still very functionally limited but did have her best session by far today.  Follow Up Recommendations  SNF     Equipment Recommendations       Recommendations for Other Services       Precautions / Restrictions Precautions Precautions: Fall Restrictions RLE Weight Bearing: Weight bearing as tolerated    Mobility  Bed Mobility Overal bed mobility: Needs Assistance Bed Mobility: Supine to Sit     Supine to sit: Min guard     General bed mobility comments: Pt shows great effort in getting to EOB and needs only light cuing and close guarding assist to get to seated and scoot to EOB  Transfers Overall transfer level: Needs assistance Equipment used: Rolling walker (2 wheeled) Transfers: Sit to/from Stand Sit to Stand: Min guard;Min assist         General transfer comment: Pt did well shifting weight forward and was actually able to rise w/o direct assist  Ambulation/Gait Ambulation/Gait assistance: Min assist Ambulation Distance (Feet): 15 Feet Assistive device: Rolling walker (2 wheeled)       General Gait Details: Pt is actually able to ambulate today and takes bigger, more appropriate steps.  She was very fatigued and required excessive effort and cuing.  Pt with better posture and overall safety awareness but is still very hesitant and limited.   Stairs            Wheelchair Mobility    Modified Rankin (Stroke Patients  Only)       Balance                                    Cognition Arousal/Alertness: Awake/alert Behavior During Therapy: WFL for tasks assessed/performed Overall Cognitive Status: Within Functional Limits for tasks assessed                      Exercises Total Joint Exercises Ankle Circles/Pumps: 10 reps;PROM;AROM Quad Sets: 15 reps;Strengthening Gluteal Sets: 15 reps;Strengthening Short Arc Quad: 10 reps;AROM;AAROM Heel Slides: 5 reps;PROM;AAROM Hip ABduction/ADduction: 10 reps;AROM Straight Leg Raises: 5 reps;AROM;AAROM Knee Flexion: 5 reps;PROM    General Comments        Pertinent Vitals/Pain Pain Score: 6     Home Living                      Prior Function            PT Goals (current goals can now be found in the care plan section) Progress towards PT goals: Progressing toward goals    Frequency  BID    PT Plan Current plan remains appropriate    Co-evaluation             End of Session Equipment Utilized During Treatment: Gait belt Activity Tolerance: Patient limited by pain  Patient left: with call bell/phone within reach;with family/visitor present;with bed alarm set     Time: 1005-1034 PT Time Calculation (min) (ACUTE ONLY): 29 min  Charges:  $Gait Training: 8-22 mins $Therapeutic Exercise: 8-22 mins                    G Codes:      Kreg Shropshire, DPT 07/29/2016, 12:19 PM

## 2016-07-29 NOTE — Progress Notes (Signed)
EMS called for transportation.

## 2016-07-29 NOTE — Progress Notes (Signed)
   Subjective: 3 Days Post-Op Procedure(s) (LRB): TOTAL KNEE ARTHROPLASTY (Right) Patient reports pain as 3 on 0-10 scale.   Patient is well, and has had no acute complaints or problems Continue with physical therapy today. Progressing well with range of motion but still having difficulty with ambulation. Plan is to go Rehab after hospital stay. no nausea and no vomiting Patient denies any chest pains or shortness of breath. Objective: Vital signs in last 24 hours: Temp:  [97.8 F (36.6 C)-98.8 F (37.1 C)] 97.8 F (36.6 C) (07/30 0354) Pulse Rate:  [66-80] 69 (07/30 0354) Resp:  [16-20] 16 (07/30 0354) BP: (111-146)/(51-73) 146/51 (07/30 0354) SpO2:  [96 %-100 %] 100 % (07/30 0354) well approximated incision Heels are non tender and elevated off the bed using rolled towels Intake/Output from previous day: 07/29 0701 - 07/30 0700 In: -  Out: 250 [Urine:250] Intake/Output this shift: No intake/output data recorded.   Recent Labs  07/26/16 0926 07/27/16 0627 07/28/16 0356  HGB 15.0 13.5 13.4    Recent Labs  07/27/16 0627 07/28/16 0356  WBC 10.3 13.2*  RBC 4.42 4.38  HCT 38.8 37.9  PLT 159 144*    Recent Labs  07/27/16 0627 07/28/16 0356  NA 133* 131*  K 4.0 3.9  CL 102 100*  CO2 23 24  BUN 17 15  CREATININE 1.12* 1.11*  GLUCOSE 134* 154*  CALCIUM 8.5* 8.8*   No results for input(s): LABPT, INR in the last 72 hours.  EXAM General - Patient is Alert, Appropriate and Oriented Extremity - Neurologically intact Neurovascular intact Sensation intact distally Intact pulses distally Dorsiflexion/Plantar flexion intact Compartment soft Dressing - dressing C/D/I Motor Function - intact, moving foot and toes well on exam.    Past Medical History:  Diagnosis Date  . Anxiety   . Cervicalgia   . Diverticulosis   . Generalized OA    Osteoarthritis bilateral knees  . GERD (gastroesophageal reflux disease)   . Headache   . Hyperlipidemia   .  Hypertension   . Occasional tremors     Assessment/Plan: 3 Days Post-Op Procedure(s) (LRB): TOTAL KNEE ARTHROPLASTY (Right) Active Problems:   Osteoarthrosis, localized, primary, knee  Estimated body mass index is 36.61 kg/m as calculated from the following:   Height as of this encounter: $RemoveBeforeD'5\' 5"'ECLwAvTtLCtioK$  (1.651 m).   Weight as of this encounter: 99.8 kg (220 lb). Up with therapy Discharge to SNF  Labs: none DVT Prophylaxis - Lovenox, Foot Pumps and TED hose Weight-Bearing as tolerated to right leg    Erika Nelson R. [place] 07/29/2016, 7:05 AM

## 2016-07-30 LAB — SURGICAL PATHOLOGY

## 2016-07-31 DIAGNOSIS — F329 Major depressive disorder, single episode, unspecified: Secondary | ICD-10-CM | POA: Diagnosis not present

## 2016-07-31 DIAGNOSIS — M159 Polyosteoarthritis, unspecified: Secondary | ICD-10-CM | POA: Diagnosis not present

## 2016-07-31 DIAGNOSIS — E039 Hypothyroidism, unspecified: Secondary | ICD-10-CM | POA: Diagnosis not present

## 2016-07-31 DIAGNOSIS — J309 Allergic rhinitis, unspecified: Secondary | ICD-10-CM | POA: Diagnosis not present

## 2016-07-31 DIAGNOSIS — I10 Essential (primary) hypertension: Secondary | ICD-10-CM | POA: Diagnosis not present

## 2016-08-20 ENCOUNTER — Ambulatory Visit
Admission: RE | Admit: 2016-08-20 | Payer: Commercial Managed Care - HMO | Source: Ambulatory Visit | Admitting: Gastroenterology

## 2016-08-20 ENCOUNTER — Encounter: Admission: RE | Payer: Self-pay | Source: Ambulatory Visit

## 2016-08-20 DIAGNOSIS — Z471 Aftercare following joint replacement surgery: Secondary | ICD-10-CM | POA: Diagnosis not present

## 2016-08-20 DIAGNOSIS — M1712 Unilateral primary osteoarthritis, left knee: Secondary | ICD-10-CM | POA: Diagnosis not present

## 2016-08-20 DIAGNOSIS — F419 Anxiety disorder, unspecified: Secondary | ICD-10-CM | POA: Diagnosis not present

## 2016-08-20 DIAGNOSIS — E785 Hyperlipidemia, unspecified: Secondary | ICD-10-CM | POA: Diagnosis not present

## 2016-08-20 DIAGNOSIS — G4733 Obstructive sleep apnea (adult) (pediatric): Secondary | ICD-10-CM | POA: Diagnosis not present

## 2016-08-20 DIAGNOSIS — M542 Cervicalgia: Secondary | ICD-10-CM | POA: Diagnosis not present

## 2016-08-20 DIAGNOSIS — I10 Essential (primary) hypertension: Secondary | ICD-10-CM | POA: Diagnosis not present

## 2016-08-20 DIAGNOSIS — F329 Major depressive disorder, single episode, unspecified: Secondary | ICD-10-CM | POA: Diagnosis not present

## 2016-08-20 DIAGNOSIS — K5790 Diverticulosis of intestine, part unspecified, without perforation or abscess without bleeding: Secondary | ICD-10-CM | POA: Diagnosis not present

## 2016-08-20 SURGERY — ESOPHAGOGASTRODUODENOSCOPY (EGD) WITH PROPOFOL
Anesthesia: General

## 2016-08-21 DIAGNOSIS — I10 Essential (primary) hypertension: Secondary | ICD-10-CM | POA: Diagnosis not present

## 2016-08-21 DIAGNOSIS — Z471 Aftercare following joint replacement surgery: Secondary | ICD-10-CM | POA: Diagnosis not present

## 2016-08-21 DIAGNOSIS — M1712 Unilateral primary osteoarthritis, left knee: Secondary | ICD-10-CM | POA: Diagnosis not present

## 2016-08-21 DIAGNOSIS — E785 Hyperlipidemia, unspecified: Secondary | ICD-10-CM | POA: Diagnosis not present

## 2016-08-21 DIAGNOSIS — G4733 Obstructive sleep apnea (adult) (pediatric): Secondary | ICD-10-CM | POA: Diagnosis not present

## 2016-08-21 DIAGNOSIS — F419 Anxiety disorder, unspecified: Secondary | ICD-10-CM | POA: Diagnosis not present

## 2016-08-21 DIAGNOSIS — M542 Cervicalgia: Secondary | ICD-10-CM | POA: Diagnosis not present

## 2016-08-21 DIAGNOSIS — K5790 Diverticulosis of intestine, part unspecified, without perforation or abscess without bleeding: Secondary | ICD-10-CM | POA: Diagnosis not present

## 2016-08-21 DIAGNOSIS — F329 Major depressive disorder, single episode, unspecified: Secondary | ICD-10-CM | POA: Diagnosis not present

## 2016-08-22 DIAGNOSIS — F329 Major depressive disorder, single episode, unspecified: Secondary | ICD-10-CM | POA: Diagnosis not present

## 2016-08-22 DIAGNOSIS — K5790 Diverticulosis of intestine, part unspecified, without perforation or abscess without bleeding: Secondary | ICD-10-CM | POA: Diagnosis not present

## 2016-08-22 DIAGNOSIS — I10 Essential (primary) hypertension: Secondary | ICD-10-CM | POA: Diagnosis not present

## 2016-08-22 DIAGNOSIS — F419 Anxiety disorder, unspecified: Secondary | ICD-10-CM | POA: Diagnosis not present

## 2016-08-22 DIAGNOSIS — G4733 Obstructive sleep apnea (adult) (pediatric): Secondary | ICD-10-CM | POA: Diagnosis not present

## 2016-08-22 DIAGNOSIS — M1712 Unilateral primary osteoarthritis, left knee: Secondary | ICD-10-CM | POA: Diagnosis not present

## 2016-08-22 DIAGNOSIS — Z471 Aftercare following joint replacement surgery: Secondary | ICD-10-CM | POA: Diagnosis not present

## 2016-08-22 DIAGNOSIS — M542 Cervicalgia: Secondary | ICD-10-CM | POA: Diagnosis not present

## 2016-08-22 DIAGNOSIS — E785 Hyperlipidemia, unspecified: Secondary | ICD-10-CM | POA: Diagnosis not present

## 2016-08-24 DIAGNOSIS — I10 Essential (primary) hypertension: Secondary | ICD-10-CM | POA: Diagnosis not present

## 2016-08-24 DIAGNOSIS — F329 Major depressive disorder, single episode, unspecified: Secondary | ICD-10-CM | POA: Diagnosis not present

## 2016-08-24 DIAGNOSIS — K5790 Diverticulosis of intestine, part unspecified, without perforation or abscess without bleeding: Secondary | ICD-10-CM | POA: Diagnosis not present

## 2016-08-24 DIAGNOSIS — E785 Hyperlipidemia, unspecified: Secondary | ICD-10-CM | POA: Diagnosis not present

## 2016-08-24 DIAGNOSIS — M1712 Unilateral primary osteoarthritis, left knee: Secondary | ICD-10-CM | POA: Diagnosis not present

## 2016-08-24 DIAGNOSIS — G4733 Obstructive sleep apnea (adult) (pediatric): Secondary | ICD-10-CM | POA: Diagnosis not present

## 2016-08-24 DIAGNOSIS — M542 Cervicalgia: Secondary | ICD-10-CM | POA: Diagnosis not present

## 2016-08-24 DIAGNOSIS — F419 Anxiety disorder, unspecified: Secondary | ICD-10-CM | POA: Diagnosis not present

## 2016-08-24 DIAGNOSIS — Z471 Aftercare following joint replacement surgery: Secondary | ICD-10-CM | POA: Diagnosis not present

## 2016-08-27 DIAGNOSIS — M1712 Unilateral primary osteoarthritis, left knee: Secondary | ICD-10-CM | POA: Diagnosis not present

## 2016-08-27 DIAGNOSIS — F329 Major depressive disorder, single episode, unspecified: Secondary | ICD-10-CM | POA: Diagnosis not present

## 2016-08-27 DIAGNOSIS — M542 Cervicalgia: Secondary | ICD-10-CM | POA: Diagnosis not present

## 2016-08-27 DIAGNOSIS — I10 Essential (primary) hypertension: Secondary | ICD-10-CM | POA: Diagnosis not present

## 2016-08-27 DIAGNOSIS — E785 Hyperlipidemia, unspecified: Secondary | ICD-10-CM | POA: Diagnosis not present

## 2016-08-27 DIAGNOSIS — F419 Anxiety disorder, unspecified: Secondary | ICD-10-CM | POA: Diagnosis not present

## 2016-08-27 DIAGNOSIS — K5790 Diverticulosis of intestine, part unspecified, without perforation or abscess without bleeding: Secondary | ICD-10-CM | POA: Diagnosis not present

## 2016-08-27 DIAGNOSIS — Z471 Aftercare following joint replacement surgery: Secondary | ICD-10-CM | POA: Diagnosis not present

## 2016-08-27 DIAGNOSIS — G4733 Obstructive sleep apnea (adult) (pediatric): Secondary | ICD-10-CM | POA: Diagnosis not present

## 2016-08-29 DIAGNOSIS — I10 Essential (primary) hypertension: Secondary | ICD-10-CM | POA: Diagnosis not present

## 2016-08-29 DIAGNOSIS — M542 Cervicalgia: Secondary | ICD-10-CM | POA: Diagnosis not present

## 2016-08-29 DIAGNOSIS — F329 Major depressive disorder, single episode, unspecified: Secondary | ICD-10-CM | POA: Diagnosis not present

## 2016-08-29 DIAGNOSIS — Z471 Aftercare following joint replacement surgery: Secondary | ICD-10-CM | POA: Diagnosis not present

## 2016-08-29 DIAGNOSIS — E785 Hyperlipidemia, unspecified: Secondary | ICD-10-CM | POA: Diagnosis not present

## 2016-08-29 DIAGNOSIS — K5790 Diverticulosis of intestine, part unspecified, without perforation or abscess without bleeding: Secondary | ICD-10-CM | POA: Diagnosis not present

## 2016-08-29 DIAGNOSIS — M1712 Unilateral primary osteoarthritis, left knee: Secondary | ICD-10-CM | POA: Diagnosis not present

## 2016-08-29 DIAGNOSIS — G4733 Obstructive sleep apnea (adult) (pediatric): Secondary | ICD-10-CM | POA: Diagnosis not present

## 2016-08-29 DIAGNOSIS — F419 Anxiety disorder, unspecified: Secondary | ICD-10-CM | POA: Diagnosis not present

## 2016-08-31 DIAGNOSIS — M542 Cervicalgia: Secondary | ICD-10-CM | POA: Diagnosis not present

## 2016-08-31 DIAGNOSIS — F419 Anxiety disorder, unspecified: Secondary | ICD-10-CM | POA: Diagnosis not present

## 2016-08-31 DIAGNOSIS — E785 Hyperlipidemia, unspecified: Secondary | ICD-10-CM | POA: Diagnosis not present

## 2016-08-31 DIAGNOSIS — F329 Major depressive disorder, single episode, unspecified: Secondary | ICD-10-CM | POA: Diagnosis not present

## 2016-08-31 DIAGNOSIS — M1712 Unilateral primary osteoarthritis, left knee: Secondary | ICD-10-CM | POA: Diagnosis not present

## 2016-08-31 DIAGNOSIS — K5790 Diverticulosis of intestine, part unspecified, without perforation or abscess without bleeding: Secondary | ICD-10-CM | POA: Diagnosis not present

## 2016-08-31 DIAGNOSIS — Z471 Aftercare following joint replacement surgery: Secondary | ICD-10-CM | POA: Diagnosis not present

## 2016-08-31 DIAGNOSIS — G4733 Obstructive sleep apnea (adult) (pediatric): Secondary | ICD-10-CM | POA: Diagnosis not present

## 2016-08-31 DIAGNOSIS — I10 Essential (primary) hypertension: Secondary | ICD-10-CM | POA: Diagnosis not present

## 2016-09-03 DIAGNOSIS — M542 Cervicalgia: Secondary | ICD-10-CM | POA: Diagnosis not present

## 2016-09-03 DIAGNOSIS — G4733 Obstructive sleep apnea (adult) (pediatric): Secondary | ICD-10-CM | POA: Diagnosis not present

## 2016-09-03 DIAGNOSIS — M1712 Unilateral primary osteoarthritis, left knee: Secondary | ICD-10-CM | POA: Diagnosis not present

## 2016-09-03 DIAGNOSIS — Z471 Aftercare following joint replacement surgery: Secondary | ICD-10-CM | POA: Diagnosis not present

## 2016-09-03 DIAGNOSIS — K5790 Diverticulosis of intestine, part unspecified, without perforation or abscess without bleeding: Secondary | ICD-10-CM | POA: Diagnosis not present

## 2016-09-03 DIAGNOSIS — F419 Anxiety disorder, unspecified: Secondary | ICD-10-CM | POA: Diagnosis not present

## 2016-09-03 DIAGNOSIS — I10 Essential (primary) hypertension: Secondary | ICD-10-CM | POA: Diagnosis not present

## 2016-09-03 DIAGNOSIS — E785 Hyperlipidemia, unspecified: Secondary | ICD-10-CM | POA: Diagnosis not present

## 2016-09-03 DIAGNOSIS — F329 Major depressive disorder, single episode, unspecified: Secondary | ICD-10-CM | POA: Diagnosis not present

## 2016-09-05 DIAGNOSIS — I10 Essential (primary) hypertension: Secondary | ICD-10-CM | POA: Diagnosis not present

## 2016-09-05 DIAGNOSIS — G4733 Obstructive sleep apnea (adult) (pediatric): Secondary | ICD-10-CM | POA: Diagnosis not present

## 2016-09-05 DIAGNOSIS — K5790 Diverticulosis of intestine, part unspecified, without perforation or abscess without bleeding: Secondary | ICD-10-CM | POA: Diagnosis not present

## 2016-09-05 DIAGNOSIS — M542 Cervicalgia: Secondary | ICD-10-CM | POA: Diagnosis not present

## 2016-09-05 DIAGNOSIS — Z471 Aftercare following joint replacement surgery: Secondary | ICD-10-CM | POA: Diagnosis not present

## 2016-09-05 DIAGNOSIS — M1712 Unilateral primary osteoarthritis, left knee: Secondary | ICD-10-CM | POA: Diagnosis not present

## 2016-09-05 DIAGNOSIS — F329 Major depressive disorder, single episode, unspecified: Secondary | ICD-10-CM | POA: Diagnosis not present

## 2016-09-05 DIAGNOSIS — F419 Anxiety disorder, unspecified: Secondary | ICD-10-CM | POA: Diagnosis not present

## 2016-09-05 DIAGNOSIS — E785 Hyperlipidemia, unspecified: Secondary | ICD-10-CM | POA: Diagnosis not present

## 2016-09-05 DIAGNOSIS — Z96651 Presence of right artificial knee joint: Secondary | ICD-10-CM | POA: Diagnosis not present

## 2016-10-15 DIAGNOSIS — I10 Essential (primary) hypertension: Secondary | ICD-10-CM | POA: Diagnosis not present

## 2016-10-15 DIAGNOSIS — Z0000 Encounter for general adult medical examination without abnormal findings: Secondary | ICD-10-CM | POA: Diagnosis not present

## 2016-10-15 DIAGNOSIS — E039 Hypothyroidism, unspecified: Secondary | ICD-10-CM | POA: Diagnosis not present

## 2016-10-15 DIAGNOSIS — R739 Hyperglycemia, unspecified: Secondary | ICD-10-CM | POA: Diagnosis not present

## 2016-10-26 DIAGNOSIS — E039 Hypothyroidism, unspecified: Secondary | ICD-10-CM | POA: Diagnosis not present

## 2016-10-26 DIAGNOSIS — Z23 Encounter for immunization: Secondary | ICD-10-CM | POA: Diagnosis not present

## 2016-10-26 DIAGNOSIS — E119 Type 2 diabetes mellitus without complications: Secondary | ICD-10-CM | POA: Diagnosis not present

## 2017-01-01 DIAGNOSIS — J4522 Mild intermittent asthma with status asthmaticus: Secondary | ICD-10-CM | POA: Diagnosis not present

## 2017-01-01 DIAGNOSIS — J40 Bronchitis, not specified as acute or chronic: Secondary | ICD-10-CM | POA: Diagnosis not present

## 2017-01-01 DIAGNOSIS — E119 Type 2 diabetes mellitus without complications: Secondary | ICD-10-CM | POA: Diagnosis not present

## 2017-01-29 ENCOUNTER — Other Ambulatory Visit: Payer: Self-pay | Admitting: Internal Medicine

## 2017-01-29 DIAGNOSIS — Z1231 Encounter for screening mammogram for malignant neoplasm of breast: Secondary | ICD-10-CM

## 2017-02-18 ENCOUNTER — Ambulatory Visit
Admission: RE | Admit: 2017-02-18 | Discharge: 2017-02-18 | Disposition: A | Discharge status: Home / Self Care | Payer: Commercial Managed Care - HMO | Source: Ambulatory Visit | Attending: Internal Medicine | Admitting: Internal Medicine

## 2017-02-18 DIAGNOSIS — Z1231 Encounter for screening mammogram for malignant neoplasm of breast: Secondary | ICD-10-CM | POA: Diagnosis not present

## 2017-03-14 DIAGNOSIS — M25561 Pain in right knee: Secondary | ICD-10-CM | POA: Diagnosis not present

## 2017-04-22 DIAGNOSIS — E039 Hypothyroidism, unspecified: Secondary | ICD-10-CM | POA: Diagnosis not present

## 2017-04-22 DIAGNOSIS — E119 Type 2 diabetes mellitus without complications: Secondary | ICD-10-CM | POA: Diagnosis not present

## 2017-05-02 DIAGNOSIS — Z0000 Encounter for general adult medical examination without abnormal findings: Secondary | ICD-10-CM | POA: Diagnosis not present

## 2017-05-02 DIAGNOSIS — E119 Type 2 diabetes mellitus without complications: Secondary | ICD-10-CM | POA: Diagnosis not present

## 2017-05-02 DIAGNOSIS — E039 Hypothyroidism, unspecified: Secondary | ICD-10-CM | POA: Diagnosis not present

## 2017-05-02 DIAGNOSIS — E782 Mixed hyperlipidemia: Secondary | ICD-10-CM | POA: Diagnosis not present

## 2017-06-10 DIAGNOSIS — R5383 Other fatigue: Secondary | ICD-10-CM | POA: Diagnosis not present

## 2017-10-28 DIAGNOSIS — E119 Type 2 diabetes mellitus without complications: Secondary | ICD-10-CM | POA: Diagnosis not present

## 2017-10-28 DIAGNOSIS — E039 Hypothyroidism, unspecified: Secondary | ICD-10-CM | POA: Diagnosis not present

## 2017-10-28 DIAGNOSIS — E782 Mixed hyperlipidemia: Secondary | ICD-10-CM | POA: Diagnosis not present

## 2017-11-04 DIAGNOSIS — E119 Type 2 diabetes mellitus without complications: Secondary | ICD-10-CM | POA: Diagnosis not present

## 2017-11-04 DIAGNOSIS — G5602 Carpal tunnel syndrome, left upper limb: Secondary | ICD-10-CM | POA: Diagnosis not present

## 2017-11-04 DIAGNOSIS — Z23 Encounter for immunization: Secondary | ICD-10-CM | POA: Diagnosis not present

## 2017-11-04 DIAGNOSIS — M48062 Spinal stenosis, lumbar region with neurogenic claudication: Secondary | ICD-10-CM | POA: Diagnosis not present

## 2017-11-04 DIAGNOSIS — E782 Mixed hyperlipidemia: Secondary | ICD-10-CM | POA: Diagnosis not present

## 2017-11-04 DIAGNOSIS — E039 Hypothyroidism, unspecified: Secondary | ICD-10-CM | POA: Diagnosis not present

## 2018-04-14 ENCOUNTER — Other Ambulatory Visit: Payer: Self-pay | Admitting: Internal Medicine

## 2018-04-14 DIAGNOSIS — Z1231 Encounter for screening mammogram for malignant neoplasm of breast: Secondary | ICD-10-CM

## 2018-04-28 DIAGNOSIS — E782 Mixed hyperlipidemia: Secondary | ICD-10-CM | POA: Diagnosis not present

## 2018-04-28 DIAGNOSIS — E039 Hypothyroidism, unspecified: Secondary | ICD-10-CM | POA: Diagnosis not present

## 2018-04-28 DIAGNOSIS — E119 Type 2 diabetes mellitus without complications: Secondary | ICD-10-CM | POA: Diagnosis not present

## 2018-05-01 ENCOUNTER — Ambulatory Visit
Admission: RE | Admit: 2018-05-01 | Discharge: 2018-05-01 | Disposition: A | Discharge status: Home / Self Care | Payer: Medicare HMO | Source: Ambulatory Visit | Attending: Internal Medicine | Admitting: Internal Medicine

## 2018-05-01 DIAGNOSIS — Z1231 Encounter for screening mammogram for malignant neoplasm of breast: Secondary | ICD-10-CM

## 2018-05-05 DIAGNOSIS — E119 Type 2 diabetes mellitus without complications: Secondary | ICD-10-CM | POA: Diagnosis not present

## 2018-05-05 DIAGNOSIS — R600 Localized edema: Secondary | ICD-10-CM | POA: Diagnosis not present

## 2018-05-05 DIAGNOSIS — R0602 Shortness of breath: Secondary | ICD-10-CM | POA: Diagnosis not present

## 2018-05-05 DIAGNOSIS — E039 Hypothyroidism, unspecified: Secondary | ICD-10-CM | POA: Diagnosis not present

## 2018-05-05 DIAGNOSIS — E782 Mixed hyperlipidemia: Secondary | ICD-10-CM | POA: Diagnosis not present

## 2018-05-05 DIAGNOSIS — Z0000 Encounter for general adult medical examination without abnormal findings: Secondary | ICD-10-CM | POA: Diagnosis not present

## 2018-11-03 DIAGNOSIS — E119 Type 2 diabetes mellitus without complications: Secondary | ICD-10-CM | POA: Diagnosis not present

## 2018-11-03 DIAGNOSIS — E782 Mixed hyperlipidemia: Secondary | ICD-10-CM | POA: Diagnosis not present

## 2018-11-03 DIAGNOSIS — E039 Hypothyroidism, unspecified: Secondary | ICD-10-CM | POA: Diagnosis not present

## 2018-11-10 DIAGNOSIS — E119 Type 2 diabetes mellitus without complications: Secondary | ICD-10-CM | POA: Diagnosis not present

## 2018-11-10 DIAGNOSIS — M48062 Spinal stenosis, lumbar region with neurogenic claudication: Secondary | ICD-10-CM | POA: Diagnosis not present

## 2018-11-10 DIAGNOSIS — E782 Mixed hyperlipidemia: Secondary | ICD-10-CM | POA: Diagnosis present

## 2018-11-10 DIAGNOSIS — E039 Hypothyroidism, unspecified: Secondary | ICD-10-CM | POA: Diagnosis not present

## 2018-11-10 DIAGNOSIS — R0609 Other forms of dyspnea: Secondary | ICD-10-CM | POA: Diagnosis not present

## 2018-11-17 DIAGNOSIS — M48062 Spinal stenosis, lumbar region with neurogenic claudication: Secondary | ICD-10-CM | POA: Diagnosis not present

## 2018-11-17 DIAGNOSIS — R0602 Shortness of breath: Secondary | ICD-10-CM | POA: Diagnosis not present

## 2018-11-17 DIAGNOSIS — G8929 Other chronic pain: Secondary | ICD-10-CM | POA: Diagnosis not present

## 2018-11-17 DIAGNOSIS — M545 Low back pain: Secondary | ICD-10-CM | POA: Diagnosis not present

## 2018-11-17 DIAGNOSIS — M47816 Spondylosis without myelopathy or radiculopathy, lumbar region: Secondary | ICD-10-CM | POA: Diagnosis not present

## 2018-11-17 DIAGNOSIS — M5136 Other intervertebral disc degeneration, lumbar region: Secondary | ICD-10-CM | POA: Diagnosis not present

## 2018-11-17 DIAGNOSIS — K219 Gastro-esophageal reflux disease without esophagitis: Secondary | ICD-10-CM | POA: Diagnosis not present

## 2018-11-19 DIAGNOSIS — R0602 Shortness of breath: Secondary | ICD-10-CM | POA: Diagnosis not present

## 2018-11-19 DIAGNOSIS — R0609 Other forms of dyspnea: Secondary | ICD-10-CM | POA: Diagnosis not present

## 2018-11-24 DIAGNOSIS — M47816 Spondylosis without myelopathy or radiculopathy, lumbar region: Secondary | ICD-10-CM | POA: Diagnosis not present

## 2019-02-23 DIAGNOSIS — R0602 Shortness of breath: Secondary | ICD-10-CM | POA: Diagnosis not present

## 2019-03-12 DIAGNOSIS — H5203 Hypermetropia, bilateral: Secondary | ICD-10-CM | POA: Diagnosis not present

## 2019-05-13 DIAGNOSIS — E119 Type 2 diabetes mellitus without complications: Secondary | ICD-10-CM | POA: Diagnosis not present

## 2019-05-20 DIAGNOSIS — J449 Chronic obstructive pulmonary disease, unspecified: Secondary | ICD-10-CM | POA: Diagnosis not present

## 2019-05-20 DIAGNOSIS — M48062 Spinal stenosis, lumbar region with neurogenic claudication: Secondary | ICD-10-CM | POA: Diagnosis not present

## 2019-05-20 DIAGNOSIS — Z0000 Encounter for general adult medical examination without abnormal findings: Secondary | ICD-10-CM | POA: Diagnosis not present

## 2019-05-20 DIAGNOSIS — E1169 Type 2 diabetes mellitus with other specified complication: Secondary | ICD-10-CM | POA: Diagnosis not present

## 2019-05-20 DIAGNOSIS — E782 Mixed hyperlipidemia: Secondary | ICD-10-CM | POA: Diagnosis not present

## 2019-05-26 DIAGNOSIS — Z1159 Encounter for screening for other viral diseases: Secondary | ICD-10-CM | POA: Diagnosis not present

## 2019-05-28 DIAGNOSIS — R0600 Dyspnea, unspecified: Secondary | ICD-10-CM | POA: Diagnosis not present

## 2019-10-09 DIAGNOSIS — E785 Hyperlipidemia, unspecified: Secondary | ICD-10-CM | POA: Diagnosis not present

## 2019-10-09 DIAGNOSIS — Z7984 Long term (current) use of oral hypoglycemic drugs: Secondary | ICD-10-CM | POA: Diagnosis not present

## 2019-10-09 DIAGNOSIS — Z23 Encounter for immunization: Secondary | ICD-10-CM | POA: Diagnosis not present

## 2019-10-09 DIAGNOSIS — E119 Type 2 diabetes mellitus without complications: Secondary | ICD-10-CM | POA: Diagnosis not present

## 2019-10-09 DIAGNOSIS — Z87891 Personal history of nicotine dependence: Secondary | ICD-10-CM | POA: Diagnosis not present

## 2019-10-09 DIAGNOSIS — I10 Essential (primary) hypertension: Secondary | ICD-10-CM | POA: Insufficient documentation

## 2019-10-09 DIAGNOSIS — R519 Headache, unspecified: Secondary | ICD-10-CM | POA: Diagnosis not present

## 2019-10-09 DIAGNOSIS — F439 Reaction to severe stress, unspecified: Secondary | ICD-10-CM | POA: Diagnosis not present

## 2019-11-11 DIAGNOSIS — E1169 Type 2 diabetes mellitus with other specified complication: Secondary | ICD-10-CM | POA: Diagnosis not present

## 2019-11-11 DIAGNOSIS — Z1159 Encounter for screening for other viral diseases: Secondary | ICD-10-CM | POA: Diagnosis not present

## 2019-11-11 DIAGNOSIS — E782 Mixed hyperlipidemia: Secondary | ICD-10-CM | POA: Diagnosis not present

## 2019-11-13 DIAGNOSIS — R0609 Other forms of dyspnea: Secondary | ICD-10-CM | POA: Diagnosis not present

## 2019-11-18 DIAGNOSIS — E785 Hyperlipidemia, unspecified: Secondary | ICD-10-CM | POA: Diagnosis not present

## 2019-11-18 DIAGNOSIS — Z87891 Personal history of nicotine dependence: Secondary | ICD-10-CM | POA: Diagnosis not present

## 2019-11-18 DIAGNOSIS — Z1331 Encounter for screening for depression: Secondary | ICD-10-CM | POA: Diagnosis not present

## 2019-11-18 DIAGNOSIS — R0609 Other forms of dyspnea: Secondary | ICD-10-CM | POA: Diagnosis not present

## 2019-11-18 DIAGNOSIS — N289 Disorder of kidney and ureter, unspecified: Secondary | ICD-10-CM | POA: Diagnosis not present

## 2019-11-18 DIAGNOSIS — E6609 Other obesity due to excess calories: Secondary | ICD-10-CM | POA: Diagnosis not present

## 2019-11-18 DIAGNOSIS — E119 Type 2 diabetes mellitus without complications: Secondary | ICD-10-CM | POA: Diagnosis not present

## 2019-11-18 DIAGNOSIS — I10 Essential (primary) hypertension: Secondary | ICD-10-CM | POA: Diagnosis not present

## 2019-11-18 DIAGNOSIS — Z6836 Body mass index (BMI) 36.0-36.9, adult: Secondary | ICD-10-CM | POA: Diagnosis not present

## 2020-02-23 DIAGNOSIS — J4540 Moderate persistent asthma, uncomplicated: Secondary | ICD-10-CM | POA: Diagnosis not present

## 2020-03-12 ENCOUNTER — Emergency Department
Admission: EM | Admit: 2020-03-12 | Discharge: 2020-03-12 | Disposition: A | Discharge status: Home / Self Care | Payer: Medicare HMO | Attending: Emergency Medicine | Admitting: Emergency Medicine

## 2020-03-12 ENCOUNTER — Encounter: Payer: Self-pay | Admitting: Emergency Medicine

## 2020-03-12 ENCOUNTER — Other Ambulatory Visit: Payer: Self-pay

## 2020-03-12 ENCOUNTER — Emergency Department: Payer: Medicare HMO

## 2020-03-12 DIAGNOSIS — Z96651 Presence of right artificial knee joint: Secondary | ICD-10-CM | POA: Insufficient documentation

## 2020-03-12 DIAGNOSIS — Z7901 Long term (current) use of anticoagulants: Secondary | ICD-10-CM | POA: Diagnosis not present

## 2020-03-12 DIAGNOSIS — M25552 Pain in left hip: Secondary | ICD-10-CM | POA: Diagnosis not present

## 2020-03-12 DIAGNOSIS — Z7982 Long term (current) use of aspirin: Secondary | ICD-10-CM | POA: Insufficient documentation

## 2020-03-12 MED ORDER — TRAMADOL HCL 50 MG PO TABS: 50.0000 mg | ORAL_TABLET | Freq: Two times a day (BID) | ORAL | 0 refills | Status: DC | PRN

## 2020-03-12 MED ORDER — LIDOCAINE 5 % EX PTCH
1.0000 | MEDICATED_PATCH | Freq: Once | CUTANEOUS | Status: DC
  Administered 2020-03-12: 1 via TRANSDERMAL
  Filled 2020-03-12: qty 1

## 2020-03-12 MED ORDER — CYCLOBENZAPRINE HCL 5 MG PO TABS: 5.0000 mg | ORAL_TABLET | Freq: Every day | ORAL | 0 refills | Status: DC

## 2020-03-12 MED ORDER — TRAMADOL HCL 50 MG PO TABS
50.0000 mg | ORAL_TABLET | Freq: Once | ORAL | Status: AC
  Administered 2020-03-12: 50 mg via ORAL
  Filled 2020-03-12: qty 1

## 2020-03-12 NOTE — ED Triage Notes (Signed)
Pt to ED via POV c/o back pain after climbing steps. Pt is in NAD.

## 2020-03-12 NOTE — ED Notes (Signed)
First Nurse Note: Pt to ED via POV for back pain. Pt states that she has not injured it that she aware of but was climbing some steps recently. Pt is in NAD.

## 2020-03-12 NOTE — Discharge Instructions (Signed)
No acute findings on x-ray of the left hip.  Follow discharge care instruction take medication as needed.

## 2020-03-12 NOTE — ED Notes (Signed)
Awaiting prescriptions to be printed per pt request. Pt to be wheeled out to lobby soon.

## 2020-03-12 NOTE — ED Notes (Addendum)
Pt c/o left sided back pain x 2 days. Pt became aware of pain following going down stairs. Pt states son had to help her get down the stairs because the pain was so intense. Pt states pain is worse with standing and movement, lying down there is awareness of pain but not hurting.

## 2020-03-12 NOTE — ED Provider Notes (Signed)
[facility] Emergency Department Provider Note   ____________________________________________   First MD Initiated Contact with Patient 03/12/20 1332     (approximate)  I have reviewed the triage vital signs and the nursing notes.   HISTORY  Chief Complaint Back Pain    HPI Erika Nelson is a 79 y.o. female patient complain of posterior left hip pain for 2 days.  Complaint started after patient walked up a flight of 7 steps to visit her daughter.  Patient state pain increased when she was walking down 7 steps.  Patient states she waited a day or 2 to see whether pain wear off.  Patient states status post a right knee replacement she is always put more stress on the left side when ambulating standing.  Patient uses a cane to assist with ambulation.  Patient described the pain as "achy".  No palliative measure for complaint.         Past Medical History:  Diagnosis Date  . Anxiety   . Cervicalgia   . Diverticulosis   . Generalized OA    Osteoarthritis bilateral knees  . GERD (gastroesophageal reflux disease)   . Headache   . Hyperlipidemia   . Hypertension   . Occasional tremors     Patient Active Problem List   Diagnosis Date Noted  . Osteoarthrosis, localized, primary, knee 07/26/2016    Past Surgical History:  Procedure Laterality Date  . bilateral foot surgery Bilateral   . COLONOSCOPY WITH PROPOFOL N/A 03/12/2016   Procedure: COLONOSCOPY WITH PROPOFOL;  Surgeon: Lollie Sails, MD;  Location: [facility] ENDOSCOPY;  Service: Endoscopy;  Laterality: N/A;  . KNEE ARTHROSCOPY Left   . TOTAL KNEE ARTHROPLASTY Right 07/26/2016   Procedure: TOTAL KNEE ARTHROPLASTY;  Surgeon: Hessie Knows, MD;  Location: [facility] ORS;  Service: Orthopedics;  Laterality: Right;    Prior to Admission medications   Medication Sig Start Date End Date Taking? Authorizing Provider  ALPRAZolam (XANAX) 0.25 MG tablet Take 0.125-0.25 mg by mouth at bedtime as needed for  sleep.     [provider]  aspirin EC 81 MG tablet Take 81 mg by mouth daily.    [provider]  citalopram (CELEXA) 20 MG tablet Take 20 mg by mouth daily.    [provider]  cyclobenzaprine (FLEXERIL) 5 MG tablet Take 1 tablet (5 mg total) by mouth at bedtime. 03/12/20   Sable Feil, PA-C  enoxaparin (LOVENOX) 40 MG/0.4ML injection Inject 0.3 mLs (30 mg total) into the skin daily. 07/27/16   Duanne Guess, PA-C  levothyroxine (SYNTHROID, LEVOTHROID) 75 MCG tablet Take 75 mcg by mouth daily before breakfast.    [provider]  meloxicam (MOBIC) 7.5 MG tablet Take 7.5 mg by mouth at bedtime.     [provider]  metoCLOPramide (REGLAN) 5 MG tablet Take 5 mg by mouth 2 (two) times daily.    [provider]  nystatin cream (MYCOSTATIN) Apply 1 application topically 2 (two) times daily.    [provider]  oxyCODONE (OXY IR/ROXICODONE) 5 MG immediate release tablet Take 1-2 tablets (5-10 mg total) by mouth every 3 (three) hours as needed for breakthrough pain. 07/27/16   Duanne Guess, PA-C  propranolol (INDERAL) 40 MG tablet Take 20 mg by mouth daily. tremors    [provider]  traMADol (ULTRAM) 50 MG tablet Take 1 tablet (50 mg total) by mouth every 12 (twelve) hours as needed. 03/12/20   Sable Feil, PA-C  triamcinolone cream (KENALOG) 0.1 % Apply 1 application topically 2 (two) times daily.    [provider]  triamterene-hydrochlorothiazide (MAXZIDE) 75-50 MG tablet Take 0.5 tablets by mouth 2 (two) times a week. Tuesday and Friday    [provider]    Allergies Norco [hydrocodone-acetaminophen], Prozac [fluoxetine hcl], and Sulfa antibiotics  Family History  Problem Relation Age of Onset  . Colon cancer Father   . Breast cancer Paternal Grandmother   . Stroke Brother   . Breast cancer Paternal Aunt     Social History Social History   Tobacco Use  . Smoking status: Never Smoker    . Smokeless tobacco: Never Used  Substance Use Topics  . Alcohol use: No  . Drug use: No    Review of Systems Constitutional: No fever/chills Eyes: No visual changes. ENT: No sore throat. Cardiovascular: Denies chest pain. Respiratory: Denies shortness of breath. Gastrointestinal: No abdominal pain.  No nausea, no vomiting.  No diarrhea.  No constipation. Genitourinary: Negative for dysuria. Musculoskeletal: Negative for back pain. Skin: Negative for rash. Neurological: Negative for headaches, focal weakness or numbness. Psychiatric: Anxiety.  Endocrine:  Hyperlipidemia and hypertension. Allergic/Immunilogical: Norco, Prozac, and sulfur antibiotic.  ____________________________________________   PHYSICAL EXAM:  VITAL SIGNS: ED Triage Vitals  Enc Vitals Group     BP 03/12/20 1237 (!) 158/89     Pulse Rate 03/12/20 1237 97     Resp 03/12/20 1237 16     Temp 03/12/20 1237 98.3 F (36.8 C)     Temp Source 03/12/20 1237 Oral     SpO2 03/12/20 1237 97 %     Weight 03/12/20 1236 200 lb (90.7 kg)     Height 03/12/20 1236 5\' 5"  (1.651 m)     Head Circumference --      Peak Flow --      Pain Score 03/12/20 1236 0     Pain Loc --      Pain Edu? --      Excl. in GC? --    Constitutional: Alert and oriented. Well appearing and in no acute distress. Cardiovascular: Normal rate, regular rhythm. Grossly normal heart sounds.  Good peripheral circulation.  Rigid blood pressure. Respiratory: Normal respiratory effort.  No retractions. Lungs CTAB. Gastrointestinal: Soft and nontender. No distention. No abdominal bruits. No CVA tenderness. Genitourinary: Deferred Musculoskeletal: No obvious lumbar spine deformity.  No leg length discrepancy.  Patient has moderate guarding palpation of the posterior left hip.   Neurologic:  Normal speech and language. No gross focal neurologic deficits are appreciated. No gait instability. Skin:  Skin is warm, dry and intact. No rash  noted. Psychiatric: Mood and affect are normal. Speech and behavior are normal.  ____________________________________________   LABS (all labs ordered are listed, but only abnormal results are displayed)  Labs Reviewed - No data to display ____________________________________________  EKG   ____________________________________________  RADIOLOGY  ED MD interpretation:    Official radiology report(s): DG Hip Unilat W or Wo Pelvis 2-3 Views Left  Result Date: 03/12/2020 CLINICAL DATA:  Acute LEFT hip pain for 3 days.  No known injury. EXAM: DG HIP (WITH OR WITHOUT PELVIS) 2-3V LEFT COMPARISON:  05/22/2016 CT FINDINGS: There is no evidence of hip fracture or dislocation. There is no evidence of arthropathy or other focal bone abnormality. IMPRESSION: No acute abnormality. Electronically Signed   By: 05/24/2016 M.D.   On: 03/12/2020 14:10    ____________________________________________   PROCEDURES  Procedure(s) performed (including Critical Care):  Procedures   ____________________________________________   INITIAL IMPRESSION / ASSESSMENT AND PLAN / ED COURSE  As part of my medical decision making, I reviewed the following data within the [facility]     Patient presents with posterior left hip pain for 2 days status post walking up and down a flight of stairs.  Discussed x-ray findings with patient.  Patient complaint physical exam consistent with musculoskeletal pain of the left hip.  Patient given discharge care instruction advised take medication directed.  Follow-up with PCP.    DANNIEL GRENZ was evaluated in Emergency Department on 03/12/2020 for the symptoms described in the history of present illness. She was evaluated in the context of the global COVID-19 pandemic, which necessitated consideration that the patient might be at risk for infection with the SARS-CoV-2 virus that causes COVID-19. Institutional protocols and algorithms that pertain to the  evaluation of patients at risk for COVID-19 are in a state of rapid change based on information released by regulatory bodies including the CDC and federal and state organizations. These policies and algorithms were followed during the patient's care in the ED.       ____________________________________________   FINAL CLINICAL IMPRESSION(S) / ED DIAGNOSES  Final diagnoses:  Left hip pain     ED Discharge Orders         Ordered    traMADol (ULTRAM) 50 MG tablet  Every 12 hours PRN     03/12/20 1431    cyclobenzaprine (FLEXERIL) 5 MG tablet  Daily at bedtime     03/12/20 1431           Note:  This document was prepared using Dragon voice recognition software and may include unintentional dictation errors.    Sable Feil, PA-C 03/12/20 1435    Vanessa , MD 03/13/20 [phone]

## 2020-04-12 DIAGNOSIS — H25813 Combined forms of age-related cataract, bilateral: Secondary | ICD-10-CM | POA: Diagnosis not present

## 2020-04-12 DIAGNOSIS — E119 Type 2 diabetes mellitus without complications: Secondary | ICD-10-CM | POA: Diagnosis not present

## 2020-04-12 DIAGNOSIS — H35033 Hypertensive retinopathy, bilateral: Secondary | ICD-10-CM | POA: Diagnosis not present

## 2020-04-12 DIAGNOSIS — Z135 Encounter for screening for eye and ear disorders: Secondary | ICD-10-CM | POA: Diagnosis not present

## 2020-04-12 DIAGNOSIS — H524 Presbyopia: Secondary | ICD-10-CM | POA: Diagnosis not present

## 2020-04-12 DIAGNOSIS — Z0100 Encounter for examination of eyes and vision without abnormal findings: Secondary | ICD-10-CM | POA: Diagnosis not present

## 2020-04-12 DIAGNOSIS — I10 Essential (primary) hypertension: Secondary | ICD-10-CM | POA: Diagnosis not present

## 2020-05-16 ENCOUNTER — Other Ambulatory Visit: Payer: Self-pay | Admitting: Psychiatry

## 2020-05-16 ENCOUNTER — Other Ambulatory Visit: Payer: Self-pay | Admitting: Internal Medicine

## 2020-05-16 DIAGNOSIS — Z1231 Encounter for screening mammogram for malignant neoplasm of breast: Secondary | ICD-10-CM

## 2020-05-17 ENCOUNTER — Ambulatory Visit
Admission: RE | Admit: 2020-05-17 | Discharge: 2020-05-17 | Disposition: A | Discharge status: Home / Self Care | Payer: Medicare HMO | Source: Ambulatory Visit | Attending: Internal Medicine | Admitting: Internal Medicine

## 2020-05-17 DIAGNOSIS — E782 Mixed hyperlipidemia: Secondary | ICD-10-CM | POA: Diagnosis not present

## 2020-05-17 DIAGNOSIS — Z1231 Encounter for screening mammogram for malignant neoplasm of breast: Secondary | ICD-10-CM | POA: Diagnosis not present

## 2020-05-17 DIAGNOSIS — E1169 Type 2 diabetes mellitus with other specified complication: Secondary | ICD-10-CM | POA: Diagnosis not present

## 2020-05-24 DIAGNOSIS — J449 Chronic obstructive pulmonary disease, unspecified: Secondary | ICD-10-CM | POA: Diagnosis not present

## 2020-05-24 DIAGNOSIS — E782 Mixed hyperlipidemia: Secondary | ICD-10-CM | POA: Diagnosis not present

## 2020-05-24 DIAGNOSIS — Z87891 Personal history of nicotine dependence: Secondary | ICD-10-CM | POA: Diagnosis not present

## 2020-05-24 DIAGNOSIS — E1169 Type 2 diabetes mellitus with other specified complication: Secondary | ICD-10-CM | POA: Diagnosis not present

## 2020-05-24 DIAGNOSIS — E785 Hyperlipidemia, unspecified: Secondary | ICD-10-CM | POA: Diagnosis not present

## 2020-05-24 DIAGNOSIS — Z0000 Encounter for general adult medical examination without abnormal findings: Secondary | ICD-10-CM | POA: Diagnosis not present

## 2020-05-24 DIAGNOSIS — E119 Type 2 diabetes mellitus without complications: Secondary | ICD-10-CM | POA: Diagnosis not present

## 2020-05-24 DIAGNOSIS — M48062 Spinal stenosis, lumbar region with neurogenic claudication: Secondary | ICD-10-CM | POA: Diagnosis not present

## 2020-05-31 DIAGNOSIS — M5136 Other intervertebral disc degeneration, lumbar region: Secondary | ICD-10-CM | POA: Diagnosis not present

## 2020-05-31 DIAGNOSIS — M47816 Spondylosis without myelopathy or radiculopathy, lumbar region: Secondary | ICD-10-CM | POA: Diagnosis not present

## 2020-05-31 DIAGNOSIS — M5489 Other dorsalgia: Secondary | ICD-10-CM | POA: Diagnosis not present

## 2020-05-31 DIAGNOSIS — M545 Low back pain: Secondary | ICD-10-CM | POA: Diagnosis not present

## 2020-06-08 DIAGNOSIS — M5410 Radiculopathy, site unspecified: Secondary | ICD-10-CM | POA: Diagnosis not present

## 2020-06-08 DIAGNOSIS — E1122 Type 2 diabetes mellitus with diabetic chronic kidney disease: Secondary | ICD-10-CM | POA: Diagnosis not present

## 2020-06-08 DIAGNOSIS — M792 Neuralgia and neuritis, unspecified: Secondary | ICD-10-CM | POA: Diagnosis not present

## 2020-06-08 DIAGNOSIS — G8929 Other chronic pain: Secondary | ICD-10-CM | POA: Diagnosis not present

## 2020-06-08 DIAGNOSIS — M48062 Spinal stenosis, lumbar region with neurogenic claudication: Secondary | ICD-10-CM | POA: Diagnosis not present

## 2020-06-08 DIAGNOSIS — N189 Chronic kidney disease, unspecified: Secondary | ICD-10-CM | POA: Diagnosis not present

## 2020-06-08 DIAGNOSIS — M5136 Other intervertebral disc degeneration, lumbar region: Secondary | ICD-10-CM | POA: Diagnosis not present

## 2020-06-08 DIAGNOSIS — I129 Hypertensive chronic kidney disease with stage 1 through stage 4 chronic kidney disease, or unspecified chronic kidney disease: Secondary | ICD-10-CM | POA: Diagnosis not present

## 2020-06-08 DIAGNOSIS — M150 Primary generalized (osteo)arthritis: Secondary | ICD-10-CM | POA: Diagnosis not present

## 2020-06-14 DIAGNOSIS — M150 Primary generalized (osteo)arthritis: Secondary | ICD-10-CM | POA: Diagnosis not present

## 2020-06-14 DIAGNOSIS — M48062 Spinal stenosis, lumbar region with neurogenic claudication: Secondary | ICD-10-CM | POA: Diagnosis not present

## 2020-06-14 DIAGNOSIS — I129 Hypertensive chronic kidney disease with stage 1 through stage 4 chronic kidney disease, or unspecified chronic kidney disease: Secondary | ICD-10-CM | POA: Diagnosis not present

## 2020-06-14 DIAGNOSIS — N189 Chronic kidney disease, unspecified: Secondary | ICD-10-CM | POA: Diagnosis not present

## 2020-06-14 DIAGNOSIS — M792 Neuralgia and neuritis, unspecified: Secondary | ICD-10-CM | POA: Diagnosis not present

## 2020-06-14 DIAGNOSIS — M5136 Other intervertebral disc degeneration, lumbar region: Secondary | ICD-10-CM | POA: Diagnosis not present

## 2020-06-14 DIAGNOSIS — E1122 Type 2 diabetes mellitus with diabetic chronic kidney disease: Secondary | ICD-10-CM | POA: Diagnosis not present

## 2020-06-14 DIAGNOSIS — G8929 Other chronic pain: Secondary | ICD-10-CM | POA: Diagnosis not present

## 2020-06-14 DIAGNOSIS — M5410 Radiculopathy, site unspecified: Secondary | ICD-10-CM | POA: Diagnosis not present

## 2020-06-16 DIAGNOSIS — M792 Neuralgia and neuritis, unspecified: Secondary | ICD-10-CM | POA: Diagnosis not present

## 2020-06-16 DIAGNOSIS — E1122 Type 2 diabetes mellitus with diabetic chronic kidney disease: Secondary | ICD-10-CM | POA: Diagnosis not present

## 2020-06-16 DIAGNOSIS — M150 Primary generalized (osteo)arthritis: Secondary | ICD-10-CM | POA: Diagnosis not present

## 2020-06-16 DIAGNOSIS — M5410 Radiculopathy, site unspecified: Secondary | ICD-10-CM | POA: Diagnosis not present

## 2020-06-16 DIAGNOSIS — G8929 Other chronic pain: Secondary | ICD-10-CM | POA: Diagnosis not present

## 2020-06-16 DIAGNOSIS — N189 Chronic kidney disease, unspecified: Secondary | ICD-10-CM | POA: Diagnosis not present

## 2020-06-16 DIAGNOSIS — M5136 Other intervertebral disc degeneration, lumbar region: Secondary | ICD-10-CM | POA: Diagnosis not present

## 2020-06-16 DIAGNOSIS — M48062 Spinal stenosis, lumbar region with neurogenic claudication: Secondary | ICD-10-CM | POA: Diagnosis not present

## 2020-06-16 DIAGNOSIS — I129 Hypertensive chronic kidney disease with stage 1 through stage 4 chronic kidney disease, or unspecified chronic kidney disease: Secondary | ICD-10-CM | POA: Diagnosis not present

## 2020-06-21 DIAGNOSIS — G8929 Other chronic pain: Secondary | ICD-10-CM | POA: Diagnosis not present

## 2020-06-21 DIAGNOSIS — E1122 Type 2 diabetes mellitus with diabetic chronic kidney disease: Secondary | ICD-10-CM | POA: Diagnosis not present

## 2020-06-21 DIAGNOSIS — M150 Primary generalized (osteo)arthritis: Secondary | ICD-10-CM | POA: Diagnosis not present

## 2020-06-21 DIAGNOSIS — M48062 Spinal stenosis, lumbar region with neurogenic claudication: Secondary | ICD-10-CM | POA: Diagnosis not present

## 2020-06-21 DIAGNOSIS — M792 Neuralgia and neuritis, unspecified: Secondary | ICD-10-CM | POA: Diagnosis not present

## 2020-06-21 DIAGNOSIS — I129 Hypertensive chronic kidney disease with stage 1 through stage 4 chronic kidney disease, or unspecified chronic kidney disease: Secondary | ICD-10-CM | POA: Diagnosis not present

## 2020-06-21 DIAGNOSIS — M5136 Other intervertebral disc degeneration, lumbar region: Secondary | ICD-10-CM | POA: Diagnosis not present

## 2020-06-21 DIAGNOSIS — N189 Chronic kidney disease, unspecified: Secondary | ICD-10-CM | POA: Diagnosis not present

## 2020-06-21 DIAGNOSIS — M5410 Radiculopathy, site unspecified: Secondary | ICD-10-CM | POA: Diagnosis not present

## 2020-06-23 DIAGNOSIS — M48062 Spinal stenosis, lumbar region with neurogenic claudication: Secondary | ICD-10-CM | POA: Diagnosis not present

## 2020-06-23 DIAGNOSIS — M5410 Radiculopathy, site unspecified: Secondary | ICD-10-CM | POA: Diagnosis not present

## 2020-06-23 DIAGNOSIS — N189 Chronic kidney disease, unspecified: Secondary | ICD-10-CM | POA: Diagnosis not present

## 2020-06-23 DIAGNOSIS — I129 Hypertensive chronic kidney disease with stage 1 through stage 4 chronic kidney disease, or unspecified chronic kidney disease: Secondary | ICD-10-CM | POA: Diagnosis not present

## 2020-06-23 DIAGNOSIS — M5136 Other intervertebral disc degeneration, lumbar region: Secondary | ICD-10-CM | POA: Diagnosis not present

## 2020-06-23 DIAGNOSIS — M150 Primary generalized (osteo)arthritis: Secondary | ICD-10-CM | POA: Diagnosis not present

## 2020-06-23 DIAGNOSIS — E1122 Type 2 diabetes mellitus with diabetic chronic kidney disease: Secondary | ICD-10-CM | POA: Diagnosis not present

## 2020-06-23 DIAGNOSIS — G8929 Other chronic pain: Secondary | ICD-10-CM | POA: Diagnosis not present

## 2020-06-23 DIAGNOSIS — M792 Neuralgia and neuritis, unspecified: Secondary | ICD-10-CM | POA: Diagnosis not present

## 2020-06-29 DIAGNOSIS — I129 Hypertensive chronic kidney disease with stage 1 through stage 4 chronic kidney disease, or unspecified chronic kidney disease: Secondary | ICD-10-CM | POA: Diagnosis not present

## 2020-06-29 DIAGNOSIS — M48062 Spinal stenosis, lumbar region with neurogenic claudication: Secondary | ICD-10-CM | POA: Diagnosis not present

## 2020-06-29 DIAGNOSIS — M150 Primary generalized (osteo)arthritis: Secondary | ICD-10-CM | POA: Diagnosis not present

## 2020-06-29 DIAGNOSIS — M5136 Other intervertebral disc degeneration, lumbar region: Secondary | ICD-10-CM | POA: Diagnosis not present

## 2020-06-29 DIAGNOSIS — G8929 Other chronic pain: Secondary | ICD-10-CM | POA: Diagnosis not present

## 2020-06-29 DIAGNOSIS — N189 Chronic kidney disease, unspecified: Secondary | ICD-10-CM | POA: Diagnosis not present

## 2020-06-29 DIAGNOSIS — E1122 Type 2 diabetes mellitus with diabetic chronic kidney disease: Secondary | ICD-10-CM | POA: Diagnosis not present

## 2020-06-29 DIAGNOSIS — M5410 Radiculopathy, site unspecified: Secondary | ICD-10-CM | POA: Diagnosis not present

## 2020-06-29 DIAGNOSIS — M792 Neuralgia and neuritis, unspecified: Secondary | ICD-10-CM | POA: Diagnosis not present

## 2020-07-01 DIAGNOSIS — G8929 Other chronic pain: Secondary | ICD-10-CM | POA: Diagnosis not present

## 2020-07-01 DIAGNOSIS — M5410 Radiculopathy, site unspecified: Secondary | ICD-10-CM | POA: Diagnosis not present

## 2020-07-01 DIAGNOSIS — I129 Hypertensive chronic kidney disease with stage 1 through stage 4 chronic kidney disease, or unspecified chronic kidney disease: Secondary | ICD-10-CM | POA: Diagnosis not present

## 2020-07-01 DIAGNOSIS — N189 Chronic kidney disease, unspecified: Secondary | ICD-10-CM | POA: Diagnosis not present

## 2020-07-01 DIAGNOSIS — M150 Primary generalized (osteo)arthritis: Secondary | ICD-10-CM | POA: Diagnosis not present

## 2020-07-01 DIAGNOSIS — E1122 Type 2 diabetes mellitus with diabetic chronic kidney disease: Secondary | ICD-10-CM | POA: Diagnosis not present

## 2020-07-01 DIAGNOSIS — M48062 Spinal stenosis, lumbar region with neurogenic claudication: Secondary | ICD-10-CM | POA: Diagnosis not present

## 2020-07-01 DIAGNOSIS — M5136 Other intervertebral disc degeneration, lumbar region: Secondary | ICD-10-CM | POA: Diagnosis not present

## 2020-07-01 DIAGNOSIS — M792 Neuralgia and neuritis, unspecified: Secondary | ICD-10-CM | POA: Diagnosis not present

## 2020-07-05 DIAGNOSIS — E1122 Type 2 diabetes mellitus with diabetic chronic kidney disease: Secondary | ICD-10-CM | POA: Diagnosis not present

## 2020-07-05 DIAGNOSIS — M5136 Other intervertebral disc degeneration, lumbar region: Secondary | ICD-10-CM | POA: Diagnosis not present

## 2020-07-05 DIAGNOSIS — G8929 Other chronic pain: Secondary | ICD-10-CM | POA: Diagnosis not present

## 2020-07-05 DIAGNOSIS — I129 Hypertensive chronic kidney disease with stage 1 through stage 4 chronic kidney disease, or unspecified chronic kidney disease: Secondary | ICD-10-CM | POA: Diagnosis not present

## 2020-07-05 DIAGNOSIS — M5410 Radiculopathy, site unspecified: Secondary | ICD-10-CM | POA: Diagnosis not present

## 2020-07-05 DIAGNOSIS — M48062 Spinal stenosis, lumbar region with neurogenic claudication: Secondary | ICD-10-CM | POA: Diagnosis not present

## 2020-07-05 DIAGNOSIS — M150 Primary generalized (osteo)arthritis: Secondary | ICD-10-CM | POA: Diagnosis not present

## 2020-07-05 DIAGNOSIS — N189 Chronic kidney disease, unspecified: Secondary | ICD-10-CM | POA: Diagnosis not present

## 2020-07-05 DIAGNOSIS — M792 Neuralgia and neuritis, unspecified: Secondary | ICD-10-CM | POA: Diagnosis not present

## 2020-07-08 DIAGNOSIS — M48062 Spinal stenosis, lumbar region with neurogenic claudication: Secondary | ICD-10-CM | POA: Diagnosis not present

## 2020-07-08 DIAGNOSIS — M150 Primary generalized (osteo)arthritis: Secondary | ICD-10-CM | POA: Diagnosis not present

## 2020-07-08 DIAGNOSIS — M792 Neuralgia and neuritis, unspecified: Secondary | ICD-10-CM | POA: Diagnosis not present

## 2020-07-08 DIAGNOSIS — G8929 Other chronic pain: Secondary | ICD-10-CM | POA: Diagnosis not present

## 2020-07-08 DIAGNOSIS — M5410 Radiculopathy, site unspecified: Secondary | ICD-10-CM | POA: Diagnosis not present

## 2020-07-08 DIAGNOSIS — I129 Hypertensive chronic kidney disease with stage 1 through stage 4 chronic kidney disease, or unspecified chronic kidney disease: Secondary | ICD-10-CM | POA: Diagnosis not present

## 2020-07-08 DIAGNOSIS — N189 Chronic kidney disease, unspecified: Secondary | ICD-10-CM | POA: Diagnosis not present

## 2020-07-08 DIAGNOSIS — E1122 Type 2 diabetes mellitus with diabetic chronic kidney disease: Secondary | ICD-10-CM | POA: Diagnosis not present

## 2020-07-08 DIAGNOSIS — M5136 Other intervertebral disc degeneration, lumbar region: Secondary | ICD-10-CM | POA: Diagnosis not present

## 2020-07-12 ENCOUNTER — Emergency Department: Payer: Medicare HMO

## 2020-07-12 ENCOUNTER — Other Ambulatory Visit: Payer: Self-pay

## 2020-07-12 ENCOUNTER — Inpatient Hospital Stay
Admission: EM | Admit: 2020-07-12 | Discharge: 2020-07-14 | DRG: 202 | Disposition: A | Discharge status: Home / Self Care | Payer: Medicare HMO | Attending: Internal Medicine | Admitting: Internal Medicine

## 2020-07-12 ENCOUNTER — Other Ambulatory Visit: Payer: Self-pay | Admitting: Physical Medicine and Rehabilitation

## 2020-07-12 DIAGNOSIS — J449 Chronic obstructive pulmonary disease, unspecified: Secondary | ICD-10-CM | POA: Diagnosis present

## 2020-07-12 DIAGNOSIS — I10 Essential (primary) hypertension: Secondary | ICD-10-CM | POA: Diagnosis not present

## 2020-07-12 DIAGNOSIS — R0609 Other forms of dyspnea: Secondary | ICD-10-CM | POA: Diagnosis not present

## 2020-07-12 DIAGNOSIS — F419 Anxiety disorder, unspecified: Secondary | ICD-10-CM | POA: Diagnosis present

## 2020-07-12 DIAGNOSIS — R0602 Shortness of breath: Secondary | ICD-10-CM

## 2020-07-12 DIAGNOSIS — G8929 Other chronic pain: Secondary | ICD-10-CM | POA: Diagnosis not present

## 2020-07-12 DIAGNOSIS — M5136 Other intervertebral disc degeneration, lumbar region: Secondary | ICD-10-CM | POA: Diagnosis not present

## 2020-07-12 DIAGNOSIS — Z96651 Presence of right artificial knee joint: Secondary | ICD-10-CM | POA: Diagnosis present

## 2020-07-12 DIAGNOSIS — E1169 Type 2 diabetes mellitus with other specified complication: Secondary | ICD-10-CM | POA: Insufficient documentation

## 2020-07-12 DIAGNOSIS — Z79899 Other long term (current) drug therapy: Secondary | ICD-10-CM

## 2020-07-12 DIAGNOSIS — E039 Hypothyroidism, unspecified: Secondary | ICD-10-CM | POA: Diagnosis present

## 2020-07-12 DIAGNOSIS — J4541 Moderate persistent asthma with (acute) exacerbation: Secondary | ICD-10-CM | POA: Diagnosis not present

## 2020-07-12 DIAGNOSIS — J9621 Acute and chronic respiratory failure with hypoxia: Secondary | ICD-10-CM | POA: Diagnosis not present

## 2020-07-12 DIAGNOSIS — I5031 Acute diastolic (congestive) heart failure: Secondary | ICD-10-CM | POA: Diagnosis not present

## 2020-07-12 DIAGNOSIS — Z79890 Hormone replacement therapy (postmenopausal): Secondary | ICD-10-CM | POA: Diagnosis not present

## 2020-07-12 DIAGNOSIS — M47816 Spondylosis without myelopathy or radiculopathy, lumbar region: Secondary | ICD-10-CM | POA: Diagnosis not present

## 2020-07-12 DIAGNOSIS — J9601 Acute respiratory failure with hypoxia: Secondary | ICD-10-CM | POA: Diagnosis not present

## 2020-07-12 DIAGNOSIS — J9811 Atelectasis: Secondary | ICD-10-CM | POA: Diagnosis present

## 2020-07-12 DIAGNOSIS — Z7984 Long term (current) use of oral hypoglycemic drugs: Secondary | ICD-10-CM | POA: Diagnosis not present

## 2020-07-12 DIAGNOSIS — M545 Low back pain: Secondary | ICD-10-CM

## 2020-07-12 DIAGNOSIS — I129 Hypertensive chronic kidney disease with stage 1 through stage 4 chronic kidney disease, or unspecified chronic kidney disease: Secondary | ICD-10-CM | POA: Diagnosis not present

## 2020-07-12 DIAGNOSIS — Z20822 Contact with and (suspected) exposure to covid-19: Secondary | ICD-10-CM | POA: Diagnosis not present

## 2020-07-12 DIAGNOSIS — E1122 Type 2 diabetes mellitus with diabetic chronic kidney disease: Secondary | ICD-10-CM | POA: Diagnosis present

## 2020-07-12 DIAGNOSIS — K219 Gastro-esophageal reflux disease without esophagitis: Secondary | ICD-10-CM | POA: Diagnosis present

## 2020-07-12 DIAGNOSIS — N1832 Chronic kidney disease, stage 3b: Secondary | ICD-10-CM | POA: Diagnosis not present

## 2020-07-12 DIAGNOSIS — Z823 Family history of stroke: Secondary | ICD-10-CM

## 2020-07-12 DIAGNOSIS — J45901 Unspecified asthma with (acute) exacerbation: Secondary | ICD-10-CM | POA: Diagnosis not present

## 2020-07-12 DIAGNOSIS — Z791 Long term (current) use of non-steroidal anti-inflammatories (NSAID): Secondary | ICD-10-CM

## 2020-07-12 DIAGNOSIS — M5489 Other dorsalgia: Secondary | ICD-10-CM | POA: Diagnosis not present

## 2020-07-12 DIAGNOSIS — R0600 Dyspnea, unspecified: Secondary | ICD-10-CM | POA: Diagnosis present

## 2020-07-12 DIAGNOSIS — M170 Bilateral primary osteoarthritis of knee: Secondary | ICD-10-CM | POA: Diagnosis present

## 2020-07-12 DIAGNOSIS — E785 Hyperlipidemia, unspecified: Secondary | ICD-10-CM

## 2020-07-12 LAB — FIBRIN DERIVATIVES D-DIMER (ARMC ONLY): Fibrin derivatives D-dimer (ARMC): 832.17 ng/mL (FEU) — ABNORMAL HIGH (ref 0.00–499.00)

## 2020-07-12 LAB — BASIC METABOLIC PANEL
Anion gap: 14 (ref 5–15)
BUN: 26 mg/dL — ABNORMAL HIGH (ref 8–23)
CO2: 19 mmol/L — ABNORMAL LOW (ref 22–32)
Calcium: 9.9 mg/dL (ref 8.9–10.3)
Chloride: 103 mmol/L (ref 98–111)
Creatinine, Ser: 1.6 mg/dL — ABNORMAL HIGH (ref 0.44–1.00)
GFR calc Af Amer: 35 mL/min — ABNORMAL LOW (ref >60)
GFR calc non Af Amer: 31 mL/min — ABNORMAL LOW (ref >60)
Glucose, Bld: 133 mg/dL — ABNORMAL HIGH (ref 70–99)
Potassium: 4.2 mmol/L (ref 3.5–5.1)
Sodium: 136 mmol/L (ref 135–145)

## 2020-07-12 LAB — SARS CORONAVIRUS 2 BY RT PCR (HOSPITAL ORDER, PERFORMED IN ~~LOC~~ HOSPITAL LAB): SARS Coronavirus 2: NEGATIVE

## 2020-07-12 LAB — BLOOD GAS, ARTERIAL
Acid-Base Excess: 0.7 mmol/L (ref 0.0–2.0)
Bicarbonate: 24.5 mmol/L (ref 20.0–28.0)
O2 Saturation: 58.3 %
Patient temperature: 37
pCO2 arterial: 36 mmHg (ref 32.0–48.0)
pH, Arterial: 7.44 (ref 7.350–7.450)
pO2, Arterial: <31 mmHg — CL (ref 83.0–108.0)

## 2020-07-12 LAB — CBC
HCT: 41.4 % (ref 36.0–46.0)
Hemoglobin: 14.5 g/dL (ref 12.0–15.0)
MCH: 30.9 pg (ref 26.0–34.0)
MCHC: 35 g/dL (ref 30.0–36.0)
MCV: 88.1 fL (ref 80.0–100.0)
Platelets: 209 10*3/uL (ref 150–400)
RBC: 4.7 MIL/uL (ref 3.87–5.11)
RDW: 12.8 % (ref 11.5–15.5)
WBC: 8 10*3/uL (ref 4.0–10.5)
nRBC: 0 % (ref 0.0–0.2)

## 2020-07-12 LAB — BRAIN NATRIURETIC PEPTIDE: B Natriuretic Peptide: 77.2 pg/mL (ref 0.0–100.0)

## 2020-07-12 LAB — TROPONIN I (HIGH SENSITIVITY)
Troponin I (High Sensitivity): 8 ng/L (ref <18)
Troponin I (High Sensitivity): 8 ng/L (ref <18)

## 2020-07-12 LAB — GLUCOSE, CAPILLARY: Glucose-Capillary: 201 mg/dL — ABNORMAL HIGH (ref 70–99)

## 2020-07-12 MED ORDER — PANTOPRAZOLE SODIUM 40 MG PO TBEC
40.0000 mg | DELAYED_RELEASE_TABLET | Freq: Two times a day (BID) | ORAL | Status: DC
  Administered 2020-07-12 – 2020-07-14 (×4): 40 mg via ORAL
  Filled 2020-07-12 (×4): qty 1

## 2020-07-12 MED ORDER — SIMVASTATIN 10 MG PO TABS
20.0000 mg | ORAL_TABLET | Freq: Every day | ORAL | Status: DC
  Administered 2020-07-12 – 2020-07-13 (×2): 20 mg via ORAL
  Filled 2020-07-12 (×3): qty 2

## 2020-07-12 MED ORDER — IOHEXOL 350 MG/ML SOLN
60.0000 mL | Freq: Once | INTRAVENOUS | Status: AC | PRN
  Administered 2020-07-12: 60 mL via INTRAVENOUS

## 2020-07-12 MED ORDER — SODIUM CHLORIDE 0.9 % IV SOLN: INTRAVENOUS | Status: DC

## 2020-07-12 MED ORDER — ONDANSETRON HCL 4 MG/2ML IJ SOLN: 4.0000 mg | Freq: Four times a day (QID) | INTRAMUSCULAR | Status: DC | PRN

## 2020-07-12 MED ORDER — ONDANSETRON HCL 4 MG PO TABS: 4.0000 mg | ORAL_TABLET | Freq: Four times a day (QID) | ORAL | Status: DC | PRN

## 2020-07-12 MED ORDER — MELOXICAM 7.5 MG PO TABS: 7.5000 mg | ORAL_TABLET | Freq: Every day | ORAL | Status: DC

## 2020-07-12 MED ORDER — LEVOTHYROXINE SODIUM 50 MCG PO TABS
75.0000 ug | ORAL_TABLET | Freq: Every day | ORAL | Status: DC
  Administered 2020-07-13 – 2020-07-14 (×2): 75 ug via ORAL
  Filled 2020-07-12 (×2): qty 2

## 2020-07-12 MED ORDER — IPRATROPIUM-ALBUTEROL 0.5-2.5 (3) MG/3ML IN SOLN
3.0000 mL | Freq: Four times a day (QID) | RESPIRATORY_TRACT | Status: DC
  Administered 2020-07-12 – 2020-07-13 (×2): 3 mL via RESPIRATORY_TRACT
  Filled 2020-07-12 (×2): qty 3

## 2020-07-12 MED ORDER — SODIUM CHLORIDE 0.9 % IV BOLUS
500.0000 mL | Freq: Once | INTRAVENOUS | Status: AC
  Administered 2020-07-12: 23:00:00 500 mL via INTRAVENOUS

## 2020-07-12 MED ORDER — IPRATROPIUM-ALBUTEROL 0.5-2.5 (3) MG/3ML IN SOLN
3.0000 mL | Freq: Once | RESPIRATORY_TRACT | Status: AC
  Administered 2020-07-12: 3 mL via RESPIRATORY_TRACT
  Filled 2020-07-12: qty 3

## 2020-07-12 MED ORDER — ALPRAZOLAM 0.25 MG PO TABS: 0.1250 mg | ORAL_TABLET | Freq: Three times a day (TID) | ORAL | Status: DC | PRN

## 2020-07-12 MED ORDER — TRIAMTERENE-HCTZ 37.5-25 MG PO TABS: 1.0000 | ORAL_TABLET | Freq: Every day | ORAL | Status: DC

## 2020-07-12 MED ORDER — NYSTATIN 100000 UNIT/GM EX CREA
1.0000 "application " | TOPICAL_CREAM | Freq: Two times a day (BID) | CUTANEOUS | Status: DC
  Administered 2020-07-12 – 2020-07-14 (×4): 1 via TOPICAL
  Filled 2020-07-12: qty 15

## 2020-07-12 MED ORDER — ENOXAPARIN SODIUM 40 MG/0.4ML ~~LOC~~ SOLN
40.0000 mg | SUBCUTANEOUS | Status: DC
  Administered 2020-07-12 – 2020-07-13 (×2): 40 mg via SUBCUTANEOUS
  Filled 2020-07-12 (×2): qty 0.4

## 2020-07-12 MED ORDER — IRBESARTAN 150 MG PO TABS
150.0000 mg | ORAL_TABLET | Freq: Every day | ORAL | Status: DC
  Administered 2020-07-12 – 2020-07-14 (×3): 150 mg via ORAL
  Filled 2020-07-12 (×3): qty 1

## 2020-07-12 MED ORDER — GLIPIZIDE ER 5 MG PO TB24
5.0000 mg | ORAL_TABLET | Freq: Every day | ORAL | Status: DC
  Filled 2020-07-12: qty 1

## 2020-07-12 MED ORDER — ACETAMINOPHEN 650 MG RE SUPP: 650.0000 mg | Freq: Four times a day (QID) | RECTAL | Status: DC | PRN

## 2020-07-12 MED ORDER — INSULIN ASPART 100 UNIT/ML ~~LOC~~ SOLN
0.0000 [IU] | Freq: Every day | SUBCUTANEOUS | Status: DC
  Administered 2020-07-12: 2 [IU] via SUBCUTANEOUS
  Filled 2020-07-12: qty 1

## 2020-07-12 MED ORDER — INSULIN ASPART 100 UNIT/ML ~~LOC~~ SOLN
0.0000 [IU] | Freq: Three times a day (TID) | SUBCUTANEOUS | Status: DC
  Administered 2020-07-13: 2 [IU] via SUBCUTANEOUS
  Administered 2020-07-13 (×2): 1 [IU] via SUBCUTANEOUS
  Filled 2020-07-12 (×4): qty 1

## 2020-07-12 MED ORDER — ACETAMINOPHEN 325 MG PO TABS
650.0000 mg | ORAL_TABLET | Freq: Four times a day (QID) | ORAL | Status: DC | PRN
  Administered 2020-07-13: 650 mg via ORAL
  Filled 2020-07-12: qty 2

## 2020-07-12 NOTE — ED Triage Notes (Signed)
First nurse note- pt here for [facility]. sats 100%RA at check in

## 2020-07-12 NOTE — ED Notes (Signed)
EDP Erika Nelson at bedside.

## 2020-07-12 NOTE — ED Notes (Signed)
Pt able to ambulate with this rn assistance to toilet w/nad at this time.

## 2020-07-12 NOTE — ED Notes (Signed)
Pt ambulatory with this RN assistance. Pt sating at 98-100% on RA. Pt c/o weakness. tachycardic at 102HR.

## 2020-07-12 NOTE — ED Notes (Signed)
Pt to CT
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
## 2020-07-12 NOTE — ED Triage Notes (Signed)
Pt comes via POV from back doctor with c/o SOB. Pt states this started this am while at the doctor.  Pt states she also felt anxious. Pt denies any CP. Pt states weakness and that this am she did feel lightheaded.  Pt states she just feels like she was gasping for breath. Pt does feel anxious.

## 2020-07-12 NOTE — ED Provider Notes (Signed)
[facility] Emergency Department Provider Note  ____________________________________________   First MD Initiated Contact with Patient 07/12/20 1344     (approximate)  I have reviewed the triage vital signs and the nursing notes.   HISTORY  Chief Complaint Shortness of Breath    HPI Erika Nelson is a 79 y.o. female  Here with shortness of breath. Pt arrives with c/o 2-3 days of progressively worsening shortness of breath. Pt reports that over the last several months, she's had progressive deconditioning and SOB with exertion. This has worsened over the last few days and she can now barely walk without dyspnea. Over the last day, her SOB has become severe and she's having difficulty even talking in full sentences.        Past Medical History:  Diagnosis Date  . Anxiety   . Cervicalgia   . Diverticulosis   . Generalized OA    Osteoarthritis bilateral knees  . GERD (gastroesophageal reflux disease)   . Headache   . Hyperlipidemia   . Hypertension   . Occasional tremors     Patient Active Problem List   Diagnosis Date Noted  . Shortness of breath 07/12/2020  . Essential hypertension   . Type 2 diabetes mellitus with hyperlipidemia ([place])   . Chronic low back pain   . Stage 3b chronic kidney disease   . Osteoarthrosis, localized, primary, knee 07/26/2016    Past Surgical History:  Procedure Laterality Date  . bilateral foot surgery Bilateral   . COLONOSCOPY WITH PROPOFOL N/A 03/12/2016   Procedure: COLONOSCOPY WITH PROPOFOL;  Surgeon: Lollie Sails, MD;  Location: [facility] ENDOSCOPY;  Service: Endoscopy;  Laterality: N/A;  . KNEE ARTHROSCOPY Left   . TOTAL KNEE ARTHROPLASTY Right 07/26/2016   Procedure: TOTAL KNEE ARTHROPLASTY;  Surgeon: Hessie Knows, MD;  Location: [facility] ORS;  Service: Orthopedics;  Laterality: Right;    Prior to Admission medications   Medication Sig Start Date End Date Taking? Authorizing Provider  glipiZIDE (GLUCOTROL  XL) 5 MG 24 hr tablet Take 5 mg by mouth daily. 06/16/20  Yes [provider]  levothyroxine (SYNTHROID, LEVOTHROID) 75 MCG tablet Take 75 mcg by mouth daily before breakfast.   Yes [provider]  meloxicam (MOBIC) 7.5 MG tablet Take 7.5 mg by mouth at bedtime.    Yes [provider]  metFORMIN (GLUCOPHAGE-XR) 500 MG 24 hr tablet Take 500 mg by mouth 2 (two) times daily. 05/11/20  Yes [provider]  nystatin cream (MYCOSTATIN) Apply 1 application topically 2 (two) times daily.   Yes [provider]  olmesartan ([name]) 20 MG tablet Take 20 mg by mouth daily. 06/16/20  Yes [provider]  omeprazole (PRILOSEC) 40 MG capsule Take 40 mg by mouth daily. 06/16/20  Yes [provider]  simvastatin (ZOCOR) 20 MG tablet Take 20 mg by mouth at bedtime. 06/16/20  Yes [provider]  triamterene-hydrochlorothiazide (MAXZIDE-25) 37.5-25 MG tablet Take 1 tablet by mouth daily. 03/18/20  Yes [provider]    Allergies Norco [hydrocodone-acetaminophen], Prozac [fluoxetine hcl], Sulfa antibiotics, and Tramadol  Family History  Problem Relation Age of Onset  . Colon cancer Father   . Cirrhosis Mother   . Breast cancer Paternal Grandmother   . Stroke Brother   . Breast cancer Paternal Aunt     Social History Social History   Tobacco Use  . Smoking status: Never Smoker  . Smokeless tobacco: Never Used  Substance Use Topics  . Alcohol use: No  .  Drug use: No    Review of Systems  Review of Systems  Constitutional: Positive for fatigue. Negative for fever.  HENT: Negative for congestion and sore throat.   Eyes: Negative for visual disturbance.  Respiratory: Positive for chest tightness and shortness of breath. Negative for cough.   Cardiovascular: Negative for chest pain.  Gastrointestinal: Negative for abdominal pain, diarrhea, nausea and vomiting.  Genitourinary: Negative for flank pain.  Musculoskeletal:  Negative for back pain and neck pain.  Skin: Negative for rash and wound.  Neurological: Positive for weakness.  All other systems reviewed and are negative.    ____________________________________________  PHYSICAL EXAM:      VITAL SIGNS: ED Triage Vitals [07/12/20 1210]  Enc Vitals Group     BP (!) 158/105     Pulse Rate 93     Resp 19     Temp 98 F (36.7 C)     Temp src      SpO2 99 %     Weight 200 lb (90.7 kg)     Height 5\' 5"  (1.651 m)     Head Circumference      Peak Flow      Pain Score 0     Pain Loc      Pain Edu?      Excl. in GC?      Physical Exam Vitals and nursing note reviewed.  Constitutional:      General: She is not in acute distress.    Appearance: She is well-developed.  HENT:     Head: Normocephalic and atraumatic.  Eyes:     Conjunctiva/sclera: Conjunctivae normal.  Cardiovascular:     Rate and Rhythm: Regular rhythm. Tachycardia present.     Heart sounds: Normal heart sounds. No murmur heard.  No friction rub.  Pulmonary:     Effort: Pulmonary effort is normal. Tachypnea present. No respiratory distress.     Breath sounds: Normal breath sounds. No wheezing or rales.  Abdominal:     General: There is no distension.     Palpations: Abdomen is soft.     Tenderness: There is no abdominal tenderness.  Musculoskeletal:     Cervical back: Neck supple.  Skin:    General: Skin is warm.     Capillary Refill: Capillary refill takes less than 2 seconds.  Neurological:     Mental Status: She is alert and oriented to person, place, and time.     Motor: No abnormal muscle tone.       ____________________________________________   LABS (all labs ordered are listed, but only abnormal results are displayed)  Labs Reviewed  BASIC METABOLIC PANEL - Abnormal; Notable for the following components:      Result Value   CO2 19 (*)    Glucose, Bld 133 (*)    BUN 26 (*)    Creatinine, Ser 1.60 (*)    GFR calc non Af Amer 31 (*)    GFR calc Af  Amer 35 (*)    All other components within normal limits  BLOOD GAS, ARTERIAL - Abnormal; Notable for the following components:   pO2, Arterial <31.0 (*)    All other components within normal limits  FIBRIN DERIVATIVES D-DIMER ([facility] ONLY) - Abnormal; Notable for the following components:   Fibrin derivatives D-dimer ([facility]) 832.17 (*)    All other components within normal limits  SARS CORONAVIRUS 2 BY RT PCR (HOSPITAL ORDER, PERFORMED IN [facility])  CBC  BRAIN NATRIURETIC PEPTIDE  HEMOGLOBIN P2Z  BASIC METABOLIC PANEL  CBC  TROPONIN I (HIGH SENSITIVITY)  TROPONIN I (HIGH SENSITIVITY)    ____________________________________________  EKG:  ________________________________________  RADIOLOGY All imaging, including plain films, CT scans, and ultrasounds, independently reviewed by me, and interpretations confirmed via formal radiology reads.  ED MD interpretation:   CXR: Clear CT: Pending  Official radiology report(s): DG Chest 2 View  Result Date: 07/12/2020 CLINICAL DATA:  Short of breath EXAM: CHEST - 2 VIEW COMPARISON:  06/17/2009 FINDINGS: Pulmonary hyperinflation. No focal infiltrate or effusion. Mild scarring left costophrenic angle. Heart size and vascularity normal. IMPRESSION: Pulmonary hyperinflation without acute abnormality Electronically Signed   By: Franchot Gallo M.D.   On: 07/12/2020 13:06   CT Angio Chest PE W and/or Wo Contrast  Result Date: 07/12/2020 CLINICAL DATA:  Shortness of breath. EXAM: CT ANGIOGRAPHY CHEST WITH CONTRAST TECHNIQUE: Multidetector CT imaging of the chest was performed using the standard protocol during bolus administration of intravenous contrast. Multiplanar CT image reconstructions and MIPs were obtained to evaluate the vascular anatomy. CONTRAST:  44mL OMNIPAQUE IOHEXOL 350 MG/ML SOLN COMPARISON:  Radiograph earlier this day.  Chest CT 03/27/2010 FINDINGS: Cardiovascular: There are no filling defects within the pulmonary  arteries to suggest pulmonary embolus. Mild calcified noncalcified aortic atherosclerosis. Irregular plaque in the distal descending thoracic aorta without plaque ulceration or acute findings. No aortic dissection or aneurysm. The heart is normal in size. There are coronary artery calcifications. There is lipomatous hypertrophy of the intra-atrial septum. Mediastinum/Nodes: No mediastinal or hilar adenopathy. No axillary adenopathy. Diminutive thyroid gland without nodule. No esophageal wall thickening. Lungs/Pleura: Scattered atelectasis most prominent in the dependent lungs. No confluent airspace disease or pneumonia. No pulmonary edema or pleural fluid. There is a 4 mm subpleural nodule in the right lower lobe, series 6, image 50. Multiple tiny subpleural nodules in the left upper lobe, series 6 images 20 and 22. Calcified granuloma in the anterior left upper lobe. Trachea and central bronchi are patent. Upper Abdomen: No acute findings. Musculoskeletal: There are no acute or suspicious osseous abnormalities. Review of the MIP images confirms the above findings. IMPRESSION: 1. No pulmonary embolus or acute intrathoracic abnormality. 2. Coronary artery calcifications. 3. Small pulmonary nodules measuring up to 4 mm, in addition to benign calcified granuloma. No follow-up needed if patient is low-risk (and has no known or suspected primary neoplasm). Non-contrast chest CT can be considered in 12 months if patient is high-risk. This recommendation follows the consensus statement: Guidelines for Management of Incidental Pulmonary Nodules Detected on CT Images: From the Fleischner Society 2017; Radiology 2017; 284:228-243. Aortic Atherosclerosis (ICD10-I70.0). Electronically Signed   By: Keith Rake M.D.   On: 07/12/2020 18:08    ____________________________________________  PROCEDURES   Procedure(s) performed (including Critical  Care):  Procedures  ____________________________________________  INITIAL IMPRESSION / MDM / [place] / ED COURSE  As part of my medical decision making, I reviewed the following data within the [place] notes reviewed and incorporated, Old chart reviewed, Notes from prior ED visits, and [place] Controlled Substance Database       *PATRICIE GEESLIN was evaluated in Emergency Department on 07/12/2020 for the symptoms described in the history of present illness. She was evaluated in the context of the global COVID-19 pandemic, which necessitated consideration that the patient might be at risk for infection with the SARS-CoV-2 virus that causes COVID-19. Institutional protocols and algorithms that pertain to the evaluation of patients at risk for COVID-19  are in a state of rapid change based on information released by regulatory bodies including the CDC and federal and state organizations. These policies and algorithms were followed during the patient's care in the ED.  Some ED evaluations and interventions may be delayed as a result of limited staffing during the pandemic.*     Medical Decision Making:  79 yo F here with severe dyspnea on exertion, limiting ability to get around house. Unclear etiology - DDx includes CHF (? [place]'s), CAD, atypical COPD/asthma. No diplopia, weakness, or signs of myasthenia or NM disorder. Pt remains severely dyspneic in ED with any amount of exertion. Does not appear overtly anxious. Labs overall unremarkable - in particular, trop neg x 2, EKG nonischemic, BNP normal, lytes wnl. CT Angio pending for eval of underling lung abnormality, PE. Given persistent severe dyspnea with even minimal exertion, feel she needs more urgent work-up and evaluation.  ____________________________________________  FINAL CLINICAL IMPRESSION(S) / ED DIAGNOSES  Final diagnoses:  Dyspnea on exertion     MEDICATIONS GIVEN DURING THIS  VISIT:  Medications  irbesartan (AVAPRO) tablet 150 mg (has no administration in time range)  simvastatin (ZOCOR) tablet 20 mg (has no administration in time range)  glipiZIDE (GLUCOTROL XL) 24 hr tablet 5 mg (has no administration in time range)  levothyroxine (SYNTHROID) tablet 75 mcg (has no administration in time range)  pantoprazole (PROTONIX) EC tablet 40 mg (has no administration in time range)  nystatin cream (MYCOSTATIN) 1 application (has no administration in time range)  insulin aspart (novoLOG) injection 0-6 Units (has no administration in time range)  insulin aspart (novoLOG) injection 0-5 Units (has no administration in time range)  ALPRAZolam (XANAX) tablet 0.125 mg (has no administration in time range)  enoxaparin (LOVENOX) injection 40 mg (has no administration in time range)  acetaminophen (TYLENOL) tablet 650 mg (has no administration in time range)    Or  acetaminophen (TYLENOL) suppository 650 mg (has no administration in time range)  ondansetron (ZOFRAN) tablet 4 mg (has no administration in time range)    Or  ondansetron (ZOFRAN) injection 4 mg (has no administration in time range)  sodium chloride 0.9 % bolus 500 mL (has no administration in time range)  0.9 %  sodium chloride infusion (has no administration in time range)  ipratropium-albuterol (DUONEB) 0.5-2.5 (3) MG/3ML nebulizer solution 3 mL (has no administration in time range)  ipratropium-albuterol (DUONEB) 0.5-2.5 (3) MG/3ML nebulizer solution 3 mL (3 mLs Nebulization Given 07/12/20 1603)  iohexol (OMNIPAQUE) 350 MG/ML injection 60 mL (60 mLs Intravenous Contrast Given 07/12/20 1736)     ED Discharge Orders    None       Note:  This document was prepared using Dragon voice recognition software and may include unintentional dictation errors.   Duffy Bruce, MD 07/12/20 2037

## 2020-07-12 NOTE — ED Notes (Signed)
Attempted to call report

## 2020-07-13 ENCOUNTER — Observation Stay
Admit: 2020-07-13 | Discharge: 2020-07-13 | Disposition: A | Discharge status: Home / Self Care | Payer: Medicare HMO | Attending: Internal Medicine | Admitting: Internal Medicine

## 2020-07-13 DIAGNOSIS — R0609 Other forms of dyspnea: Secondary | ICD-10-CM

## 2020-07-13 DIAGNOSIS — I5031 Acute diastolic (congestive) heart failure: Secondary | ICD-10-CM | POA: Diagnosis not present

## 2020-07-13 DIAGNOSIS — Z79899 Other long term (current) drug therapy: Secondary | ICD-10-CM | POA: Diagnosis not present

## 2020-07-13 DIAGNOSIS — J9811 Atelectasis: Secondary | ICD-10-CM | POA: Diagnosis present

## 2020-07-13 DIAGNOSIS — J449 Chronic obstructive pulmonary disease, unspecified: Secondary | ICD-10-CM | POA: Diagnosis present

## 2020-07-13 DIAGNOSIS — Z20822 Contact with and (suspected) exposure to covid-19: Secondary | ICD-10-CM | POA: Diagnosis present

## 2020-07-13 DIAGNOSIS — Z791 Long term (current) use of non-steroidal anti-inflammatories (NSAID): Secondary | ICD-10-CM | POA: Diagnosis not present

## 2020-07-13 DIAGNOSIS — Z79890 Hormone replacement therapy (postmenopausal): Secondary | ICD-10-CM | POA: Diagnosis not present

## 2020-07-13 DIAGNOSIS — R0600 Dyspnea, unspecified: Secondary | ICD-10-CM | POA: Diagnosis present

## 2020-07-13 DIAGNOSIS — Z823 Family history of stroke: Secondary | ICD-10-CM | POA: Diagnosis not present

## 2020-07-13 DIAGNOSIS — R0602 Shortness of breath: Secondary | ICD-10-CM

## 2020-07-13 DIAGNOSIS — F419 Anxiety disorder, unspecified: Secondary | ICD-10-CM | POA: Diagnosis present

## 2020-07-13 DIAGNOSIS — K219 Gastro-esophageal reflux disease without esophagitis: Secondary | ICD-10-CM | POA: Diagnosis present

## 2020-07-13 DIAGNOSIS — E1122 Type 2 diabetes mellitus with diabetic chronic kidney disease: Secondary | ICD-10-CM | POA: Diagnosis present

## 2020-07-13 DIAGNOSIS — Z96651 Presence of right artificial knee joint: Secondary | ICD-10-CM | POA: Diagnosis present

## 2020-07-13 DIAGNOSIS — E785 Hyperlipidemia, unspecified: Secondary | ICD-10-CM | POA: Diagnosis present

## 2020-07-13 DIAGNOSIS — N1832 Chronic kidney disease, stage 3b: Secondary | ICD-10-CM | POA: Diagnosis present

## 2020-07-13 DIAGNOSIS — I10 Essential (primary) hypertension: Secondary | ICD-10-CM | POA: Diagnosis not present

## 2020-07-13 DIAGNOSIS — Z7984 Long term (current) use of oral hypoglycemic drugs: Secondary | ICD-10-CM | POA: Diagnosis not present

## 2020-07-13 DIAGNOSIS — M170 Bilateral primary osteoarthritis of knee: Secondary | ICD-10-CM | POA: Diagnosis present

## 2020-07-13 DIAGNOSIS — J9621 Acute and chronic respiratory failure with hypoxia: Secondary | ICD-10-CM | POA: Diagnosis present

## 2020-07-13 DIAGNOSIS — J45901 Unspecified asthma with (acute) exacerbation: Secondary | ICD-10-CM | POA: Diagnosis not present

## 2020-07-13 DIAGNOSIS — I129 Hypertensive chronic kidney disease with stage 1 through stage 4 chronic kidney disease, or unspecified chronic kidney disease: Secondary | ICD-10-CM | POA: Diagnosis present

## 2020-07-13 DIAGNOSIS — J4541 Moderate persistent asthma with (acute) exacerbation: Secondary | ICD-10-CM | POA: Diagnosis present

## 2020-07-13 DIAGNOSIS — E039 Hypothyroidism, unspecified: Secondary | ICD-10-CM | POA: Diagnosis present

## 2020-07-13 LAB — BASIC METABOLIC PANEL
Anion gap: 7 (ref 5–15)
BUN: 26 mg/dL — ABNORMAL HIGH (ref 8–23)
CO2: 25 mmol/L (ref 22–32)
Calcium: 9.2 mg/dL (ref 8.9–10.3)
Chloride: 105 mmol/L (ref 98–111)
Creatinine, Ser: 1.75 mg/dL — ABNORMAL HIGH (ref 0.44–1.00)
GFR calc Af Amer: 32 mL/min — ABNORMAL LOW (ref >60)
GFR calc non Af Amer: 27 mL/min — ABNORMAL LOW (ref >60)
Glucose, Bld: 122 mg/dL — ABNORMAL HIGH (ref 70–99)
Potassium: 4.1 mmol/L (ref 3.5–5.1)
Sodium: 137 mmol/L (ref 135–145)

## 2020-07-13 LAB — HEMOGLOBIN A1C
Hgb A1c MFr Bld: 6.5 % — ABNORMAL HIGH (ref 4.8–5.6)
Mean Plasma Glucose: 139.85 mg/dL

## 2020-07-13 LAB — CBC
HCT: 35.1 % — ABNORMAL LOW (ref 36.0–46.0)
Hemoglobin: 12.6 g/dL (ref 12.0–15.0)
MCH: 31.2 pg (ref 26.0–34.0)
MCHC: 35.9 g/dL (ref 30.0–36.0)
MCV: 86.9 fL (ref 80.0–100.0)
Platelets: 176 10*3/uL (ref 150–400)
RBC: 4.04 MIL/uL (ref 3.87–5.11)
RDW: 13.2 % (ref 11.5–15.5)
WBC: 8.1 10*3/uL (ref 4.0–10.5)
nRBC: 0 % (ref 0.0–0.2)

## 2020-07-13 LAB — GLUCOSE, CAPILLARY
Glucose-Capillary: 207 mg/dL — ABNORMAL HIGH (ref 70–99)
Glucose-Capillary: 159 mg/dL — ABNORMAL HIGH (ref 70–99)
Glucose-Capillary: 173 mg/dL — ABNORMAL HIGH (ref 70–99)
Glucose-Capillary: 169 mg/dL — ABNORMAL HIGH (ref 70–99)

## 2020-07-13 LAB — ECHOCARDIOGRAM COMPLETE
Height: 65 in
Weight: 3318.4 oz

## 2020-07-13 MED ORDER — PERFLUTREN LIPID MICROSPHERE
1.0000 mL | INTRAVENOUS | Status: AC | PRN
  Administered 2020-07-13: 2 mL via INTRAVENOUS
  Filled 2020-07-13: qty 10

## 2020-07-13 MED ORDER — IPRATROPIUM-ALBUTEROL 0.5-2.5 (3) MG/3ML IN SOLN: 3.0000 mL | Freq: Three times a day (TID) | RESPIRATORY_TRACT | Status: DC

## 2020-07-13 MED ORDER — UMECLIDINIUM BROMIDE 62.5 MCG/INH IN AEPB
1.0000 | INHALATION_SPRAY | Freq: Every day | RESPIRATORY_TRACT | Status: DC
  Administered 2020-07-13 – 2020-07-14 (×2): 1 via RESPIRATORY_TRACT
  Filled 2020-07-13: qty 7

## 2020-07-13 MED ORDER — IPRATROPIUM BROMIDE 0.02 % IN SOLN
0.5000 mg | Freq: Three times a day (TID) | RESPIRATORY_TRACT | Status: DC
  Administered 2020-07-13 – 2020-07-14 (×4): 0.5 mg via RESPIRATORY_TRACT
  Filled 2020-07-13 (×4): qty 2.5

## 2020-07-13 MED ORDER — FUROSEMIDE 10 MG/ML IJ SOLN
40.0000 mg | Freq: Once | INTRAMUSCULAR | Status: AC
  Administered 2020-07-13: 17:00:00 40 mg via INTRAVENOUS
  Filled 2020-07-13: qty 4

## 2020-07-13 MED ORDER — FLUTICASONE FUROATE-VILANTEROL 100-25 MCG/INH IN AEPB
1.0000 | INHALATION_SPRAY | Freq: Every day | RESPIRATORY_TRACT | Status: DC
  Administered 2020-07-13 – 2020-07-14 (×2): 1 via RESPIRATORY_TRACT
  Filled 2020-07-13: qty 28

## 2020-07-13 MED ORDER — HYDRALAZINE HCL 50 MG PO TABS: 50.0000 mg | ORAL_TABLET | Freq: Four times a day (QID) | ORAL | Status: DC | PRN

## 2020-07-13 MED ORDER — IPRATROPIUM BROMIDE 0.02 % IN SOLN: 0.5000 mg | Freq: Four times a day (QID) | RESPIRATORY_TRACT | Status: DC

## 2020-07-13 NOTE — Consult Note (Signed)
Pulmonary Medicine          Date: 07/13/2020,   MRN# [id number] Erika Nelson 1941/09/26     AdmissionWeight: 90.7 kg                 CurrentWeight: 94.1 kg      CHIEF COMPLAINT:   Acute on chronic hypoxemic respiratory failure   HISTORY OF PRESENT ILLNESS   Ms. Erika Nelson is 79 y.o. female who was seen by [place] at [facility] pulmonary for chronic dyspnea with exertion and mild COPD diagnosed previously years ago. . She had obtained her Trelegy inhaler and nebulizer machine and has used it but reports inconsistency due to improved breathing. She has been staying safe at home. We had advised her to increase activity due to deconditioning which was contributing to her breathlessness. She has been walking more and found new ways to stay active by helping her long time friend with ongoing health issues. She reports no cough but seldomly has cough due to dry mouth. We had discussed weight loss last visit and she had tried to diet by targeting decreased PO intake post lunch. She was able to do this for 1 week but then had went back to previous eating habits, despite that she still did not gain weight from previous visit and infact lost 1 lb.   Patient had pulmonary function test in May as well as November 2020 both are inconsistent with COPD and are more suggestive of asthma.  She had come in to ED with respiratory distress, she has been grieving after loss of multiple family members      PAST MEDICAL HISTORY   Past Medical History:  Diagnosis Date  . Anxiety   . Cervicalgia   . Diverticulosis   . Generalized OA    Osteoarthritis bilateral knees  . GERD (gastroesophageal reflux disease)   . Headache   . Hyperlipidemia   . Hypertension   . Occasional tremors      SURGICAL HISTORY   Past Surgical History:  Procedure Laterality Date  . bilateral foot surgery Bilateral   . COLONOSCOPY WITH PROPOFOL N/A 03/12/2016   Procedure: COLONOSCOPY WITH PROPOFOL;  Surgeon: Christena Deem, MD;  Location: [facility] ENDOSCOPY;  Service: Endoscopy;  Laterality: N/A;  . KNEE ARTHROSCOPY Left   . TOTAL KNEE ARTHROPLASTY Right 07/26/2016   Procedure: TOTAL KNEE ARTHROPLASTY;  Surgeon: Kennedy Bucker, MD;  Location: [facility] ORS;  Service: Orthopedics;  Laterality: Right;     FAMILY HISTORY   Family History  Problem Relation Age of Onset  . Colon cancer Father   . Cirrhosis Mother   . Breast cancer Paternal Grandmother   . Stroke Brother   . Breast cancer Paternal Aunt      SOCIAL HISTORY   Social History   Tobacco Use  . Smoking status: Never Smoker  . Smokeless tobacco: Never Used  Substance Use Topics  . Alcohol use: No  . Drug use: No     MEDICATIONS    Home Medication:    Current Medication:  Current Facility-Administered Medications:  .  acetaminophen (TYLENOL) tablet 650 mg, 650 mg, Oral, Q6H PRN, 650 mg at 07/13/20 0932 **OR** acetaminophen (TYLENOL) suppository 650 mg, 650 mg, Rectal, Q6H PRN, Wieting, Richard, MD .  ALPRAZolam Prudy Feeler) tablet 0.125 mg, 0.125 mg, Oral, TID PRN, Renae Gloss, Richard, MD .  enoxaparin (LOVENOX) injection 40 mg, 40 mg, Subcutaneous, Q24H, Wieting, Richard, MD, 40 mg at 07/12/20 2303 .  fluticasone furoate-vilanterol (  BREO ELLIPTA) 100-25 MCG/INH 1 puff, 1 puff, Inhalation, Daily, 1 puff at 07/13/20 1355 **AND** umeclidinium bromide (INCRUSE ELLIPTA) 62.5 MCG/INH 1 puff, 1 puff, Inhalation, Daily, Wyvonnia Dusky, MD, 1 puff at 07/13/20 1355 .  hydrALAZINE (APRESOLINE) tablet 50 mg, 50 mg, Oral, Q6H PRN, Wyvonnia Dusky, MD .  insulin aspart (novoLOG) injection 0-5 Units, 0-5 Units, Subcutaneous, QHS, Loletha Grayer, MD, 2 Units at 07/12/20 2303 .  insulin aspart (novoLOG) injection 0-6 Units, 0-6 Units, Subcutaneous, TID WC, Loletha Grayer, MD, 1 Units at 07/13/20 1714 .  ipratropium (ATROVENT) nebulizer solution 0.5 mg, 0.5 mg, Nebulization, TID, Wyvonnia Dusky, MD, 0.5 mg at 07/13/20 1935 .  irbesartan  (AVAPRO) tablet 150 mg, 150 mg, Oral, Daily, Leslye Peer, Richard, MD, 150 mg at 07/13/20 0932 .  levothyroxine (SYNTHROID) tablet 75 mcg, 75 mcg, Oral, Q0600, Loletha Grayer, MD, 75 mcg at 07/13/20 0531 .  nystatin cream (MYCOSTATIN) 1 application, 1 application, Topical, BID, Loletha Grayer, MD, 1 application at 50/56/97 0930 .  ondansetron (ZOFRAN) tablet 4 mg, 4 mg, Oral, Q6H PRN **OR** ondansetron (ZOFRAN) injection 4 mg, 4 mg, Intravenous, Q6H PRN, Wieting, Richard, MD .  pantoprazole (PROTONIX) EC tablet 40 mg, 40 mg, Oral, BID, Leslye Peer, Richard, MD, 40 mg at 07/13/20 0932 .  simvastatin (ZOCOR) tablet 20 mg, 20 mg, Oral, QHS, Wieting, Richard, MD, 20 mg at 07/12/20 2235    ALLERGIES   Albuterol, Norco [hydrocodone-acetaminophen], Prozac [fluoxetine hcl], Sulfa antibiotics, and Tramadol     REVIEW OF SYSTEMS    Review of Systems:  Gen:  Denies  fever, sweats, chills weigh loss  HEENT: Denies blurred vision, double vision, ear pain, eye pain, hearing loss, nose bleeds, sore throat Cardiac:  No dizziness, chest pain or heaviness, chest tightness,edema Resp:   Denies cough or sputum porduction, shortness of breath,wheezing, hemoptysis,  Gi: Denies swallowing difficulty, stomach pain, nausea or vomiting, diarrhea, constipation, bowel incontinence Gu:  Denies bladder incontinence, burning urine Ext:   Denies Joint pain, stiffness or swelling Skin: Denies  skin rash, easy bruising or bleeding or hives Endoc:  Denies polyuria, polydipsia , polyphagia or weight change Psych:   Denies depression, insomnia or hallucinations   Other:  All other systems negative   VS: BP (!) 145/83 (BP Location: Right Wrist)   Pulse 98   Temp 97.7 F (36.5 C) (Oral)   Resp 18   Ht $R'5\' 5"'TX$  (1.651 m)   Wt 94.1 kg   SpO2 98%   BMI 34.51 kg/m      PHYSICAL EXAM    GENERAL:NAD, no fevers, chills, no weakness no fatigue HEAD: Normocephalic, atraumatic.  EYES: Pupils equal, round, reactive to  light. Extraocular muscles intact. No scleral icterus.  MOUTH: Moist mucosal membrane. Dentition intact. No abscess noted.  EAR, NOSE, THROAT: Clear without exudates. No external lesions.  NECK: Supple. No thyromegaly. No nodules. No JVD.  PULMONARY: Diffuse coarse rhonchi right sided +wheezes CARDIOVASCULAR: S1 and S2. Regular rate and rhythm. No murmurs, rubs, or gallops. No edema. Pedal pulses 2+ bilaterally.  GASTROINTESTINAL: Soft, nontender, nondistended. No masses. Positive bowel sounds. No hepatosplenomegaly.  MUSCULOSKELETAL: No swelling, clubbing, or edema. Range of motion full in all extremities.  NEUROLOGIC: Cranial nerves II through XII are intact. No gross focal neurological deficits. Sensation intact. Reflexes intact.  SKIN: No ulceration, lesions, rashes, or cyanosis. Skin warm and dry. Turgor intact.  PSYCHIATRIC: Mood, affect within normal limits. The patient is awake, alert and oriented x 3. Insight, judgment intact.  IMAGING    DG Chest 2 View  Result Date: 07/12/2020 CLINICAL DATA:  Short of breath EXAM: CHEST - 2 VIEW COMPARISON:  06/17/2009 FINDINGS: Pulmonary hyperinflation. No focal infiltrate or effusion. Mild scarring left costophrenic angle. Heart size and vascularity normal. IMPRESSION: Pulmonary hyperinflation without acute abnormality Electronically Signed   By: Franchot Gallo M.D.   On: 07/12/2020 13:06   CT Angio Chest PE W and/or Wo Contrast  Result Date: 07/12/2020 CLINICAL DATA:  Shortness of breath. EXAM: CT ANGIOGRAPHY CHEST WITH CONTRAST TECHNIQUE: Multidetector CT imaging of the chest was performed using the standard protocol during bolus administration of intravenous contrast. Multiplanar CT image reconstructions and MIPs were obtained to evaluate the vascular anatomy. CONTRAST:  98mL OMNIPAQUE IOHEXOL 350 MG/ML SOLN COMPARISON:  Radiograph earlier this day.  Chest CT 03/27/2010 FINDINGS: Cardiovascular: There are no filling defects within the  pulmonary arteries to suggest pulmonary embolus. Mild calcified noncalcified aortic atherosclerosis. Irregular plaque in the distal descending thoracic aorta without plaque ulceration or acute findings. No aortic dissection or aneurysm. The heart is normal in size. There are coronary artery calcifications. There is lipomatous hypertrophy of the intra-atrial septum. Mediastinum/Nodes: No mediastinal or hilar adenopathy. No axillary adenopathy. Diminutive thyroid gland without nodule. No esophageal wall thickening. Lungs/Pleura: Scattered atelectasis most prominent in the dependent lungs. No confluent airspace disease or pneumonia. No pulmonary edema or pleural fluid. There is a 4 mm subpleural nodule in the right lower lobe, series 6, image 50. Multiple tiny subpleural nodules in the left upper lobe, series 6 images 20 and 22. Calcified granuloma in the anterior left upper lobe. Trachea and central bronchi are patent. Upper Abdomen: No acute findings. Musculoskeletal: There are no acute or suspicious osseous abnormalities. Review of the MIP images confirms the above findings. IMPRESSION: 1. No pulmonary embolus or acute intrathoracic abnormality. 2. Coronary artery calcifications. 3. Small pulmonary nodules measuring up to 4 mm, in addition to benign calcified granuloma. No follow-up needed if patient is low-risk (and has no known or suspected primary neoplasm). Non-contrast chest CT can be considered in 12 months if patient is high-risk. This recommendation follows the consensus statement: Guidelines for Management of Incidental Pulmonary Nodules Detected on CT Images: From the Fleischner Society 2017; Radiology 2017; 284:228-243. Aortic Atherosclerosis (ICD10-I70.0). Electronically Signed   By: Keith Rake M.D.   On: 07/12/2020 18:08   ECHOCARDIOGRAM COMPLETE  Result Date: 07/13/2020    ECHOCARDIOGRAM REPORT   Patient Name:   Erika Nelson Date of Exam: 07/13/2020 Medical Rec #:  [id number]     Height:        65.0 in Accession #:    [id number]    Weight:       207.4 lb Date of Birth:  1941/11/05     BSA:          2.009 m Patient Age:    48 years      BP:           131/63 mmHg Patient Gender: F             HR:           104 bpm. Exam Location:  [facility] Procedure: 2D Echo, Cardiac Doppler, Color Doppler and Intracardiac            Opacification Agent Indications:     R06.00 Dyspnea  History:         Patient has no prior history of Echocardiogram examinations.  Risk Factors:Hypertension and Dyslipidemia.  Sonographer:     Charmayne Sheer RDCS (AE) Referring Phys:  [id number] Loletha Grayer Diagnosing Phys: Yolonda Kida MD  Sonographer Comments: Technically difficult study due to poor echo windows. Image acquisition challenging due to patient body habitus. IMPRESSIONS  1. Left ventricular ejection fraction, by estimation, is 60 to 65%. The left ventricle has normal function. The left ventricle demonstrates regional wall motion abnormalities (see scoring diagram/findings for description). Left ventricular diastolic parameters are consistent with Grade I diastolic dysfunction (impaired relaxation).  2. Right ventricular systolic function is normal. The right ventricular size is normal.  3. The mitral valve is normal in structure. No evidence of mitral valve regurgitation.  4. The aortic valve is normal in structure. Aortic valve regurgitation is not visualized. FINDINGS  Left Ventricle: Left ventricular ejection fraction, by estimation, is 60 to 65%. The left ventricle has normal function. The left ventricle demonstrates regional wall motion abnormalities. Definity contrast agent was given IV to delineate the left ventricular endocardial borders. The left ventricular internal cavity size was normal in size. There is borderline left ventricular hypertrophy. Left ventricular diastolic parameters are consistent with Grade I diastolic dysfunction (impaired relaxation). Right Ventricle: The right ventricular size is  normal. No increase in right ventricular wall thickness. Right ventricular systolic function is normal. Left Atrium: Left atrial size was normal in size. Right Atrium: Right atrial size was normal in size. Pericardium: There is no evidence of pericardial effusion. Mitral Valve: The mitral valve is normal in structure. No evidence of mitral valve regurgitation. MV peak gradient, 10.0 mmHg. The mean mitral valve gradient is 5.0 mmHg. Tricuspid Valve: The tricuspid valve is normal in structure. Tricuspid valve regurgitation is not demonstrated. Aortic Valve: The aortic valve is normal in structure. Aortic valve regurgitation is not visualized. Aortic valve mean gradient measures 4.0 mmHg. Aortic valve peak gradient measures 7.5 mmHg. Aortic valve area, by VTI measures 2.72 cm. Pulmonic Valve: The pulmonic valve was normal in structure. Pulmonic valve regurgitation is not visualized. Aorta: The aortic root is normal in size and structure. IAS/Shunts: No atrial level shunt detected by color flow Doppler.  LEFT VENTRICLE PLAX 2D LVIDd:         3.79 cm  Diastology LVIDs:         3.02 cm  LV e' lateral:   9.25 cm/s LV PW:         1.01 cm  LV E/e' lateral: 10.6 LV IVS:        0.80 cm  LV e' medial:    6.85 cm/s LVOT diam:     2.20 cm  LV E/e' medial:  14.3 LV SV:         67 LV SV Index:   33 LVOT Area:     3.80 cm  LEFT ATRIUM             Index LA diam:        2.70 cm 1.34 cm/m LA Vol (A2C):   45.5 ml 22.65 ml/m LA Vol (A4C):   57.9 ml 28.82 ml/m LA Biplane Vol: 51.7 ml 25.74 ml/m  AORTIC VALVE                   PULMONIC VALVE AV Area (Vmax):    2.61 cm    PV Vmax:       1.28 m/s AV Area (Vmean):   2.71 cm    PV Vmean:      85.900 cm/s AV Area (VTI):  2.72 cm    PV VTI:        0.241 m AV Vmax:           137.00 cm/s PV Peak grad:  6.6 mmHg AV Vmean:          88.700 cm/s PV Mean grad:  3.0 mmHg AV VTI:            0.245 m AV Peak Grad:      7.5 mmHg AV Mean Grad:      4.0 mmHg LVOT Vmax:         94.20 cm/s LVOT  Vmean:        63.200 cm/s LVOT VTI:          0.175 m LVOT/AV VTI ratio: 0.71  AORTA Ao Root diam: 3.00 cm MITRAL VALVE MV Area (PHT): 6.32 cm     SHUNTS MV Peak grad:  10.0 mmHg    Systemic VTI:  0.18 m MV Mean grad:  5.0 mmHg     Systemic Diam: 2.20 cm MV Vmax:       1.58 m/s MV Vmean:      107.0 cm/s MV Decel Time: 120 msec MV E velocity: 97.90 cm/s MV A velocity: 144.00 cm/s MV E/A ratio:  0.68 Dwayne D Callwood MD Electronically signed by Yolonda Kida MD Signature Date/Time: 07/13/2020/1:28:49 PM    Final       ASSESSMENT/PLAN    Acute on chronic hypoxemic respiratory failure   - due to mild exacerbation of Asthma with bibasilar atelectasis and concurrent worsening renal failure.    - patient will need chronic diuresis and renal follow up   - will work with patient on aggressive bronchopulmonary hygiene   - patient has had high emotional distress after loss of family member   - contineu incruse ICS/LABA as previous   -Incentive spirometer to be taken home and used daily multiple times per hour while at rest   Moderate persistent asthma  - may restart home regimen  - will continue care on outpatient with [facility] pulmonary   Bilateral atelectasis  - IS for home use  - MetaNEB for home use will discuss with [place]      Thank you for allowing me to participate in the care of this patient.    Patient/Family are satisfied with care plan and all questions have been answered.   This document was prepared using Dragon voice recognition software and may include unintentional dictation errors.     Ottie Glazier, M.D.  Division of [place]

## 2020-07-13 NOTE — Progress Notes (Addendum)
PROGRESS NOTE    Erika Nelson  WUJ:[id number] DOB: July 30, 1941 DOA: 07/12/2020 PCP: Rusty Aus, MD    Assessment & Plan:   Active Problems:   Shortness of breath   Dyspnea: etiology unclear, possibly secondary to CHF exacerbation vs asthma exacerbation. BNP 77.2. CTA chest was neg for PE but pulmonary nodules that need repeat CT scan in 12 months. Echo shows EF 95-62%, grade I diastolic dysfunction. IV lasix x 1.  Possible asthma exacerbation: restart home bronchodilators or hospital substitutes. Pulmon consulted   Possible acute diastolic CHF exacerbation: echo shows EF 13-08%, grade I diastolic dysfunction. IV lasix x 1. Monitor I/Os  HTN: continue on irbesartan  DM2: hold home dose of metformin, glipizide. Continue on SSI w/ accuchecks   HLD: continue on statin  Hypothyroidism: continue on home dose of levothyroxine  CKDIIIb: stable. Will continue to monitor    DVT prophylaxis: lovenox  Code Status: full  Family Communication: Disposition Plan: likely home health    Consultants:      Procedures:    Antimicrobials:   Subjective: Pt c/o shortness of breath   Objective: Vitals:   07/12/20 2323 07/13/20 0002 07/13/20 0539 07/13/20 0542  BP:  121/61 (!) 141/104 131/63  Pulse:  (!) 107 (!) 113 81  Resp:  20  18  Temp:  97.7 F (36.5 C) 98.7 F (37.1 C) 97.7 F (36.5 C)  TempSrc:  Oral  Oral  SpO2: 98% 98% 97% 99%  Weight:      Height:       No intake or output data in the 24 hours ending 07/13/20 0745 Filed Weights   07/12/20 1210 07/12/20 2029  Weight: 90.7 kg 94.1 kg    Examination:  General exam: Appears calm and comfortable  Respiratory system: decreased breath sounds b/l. No rhonchi. Unable to talk in complete sentences  Cardiovascular system: S1 & S2+. No rubs, gallops or clicks. Gastrointestinal system: Abdomen is nondistended, soft and nontender. Normal bowel sounds heard. Central nervous system: Alert and oriented. Moves all 4  extremities Psychiatry: Judgement and insight appear normal. Mood & affect appropriate.     Data Reviewed: I have personally reviewed following labs and imaging studies  CBC: Recent Labs  Lab 07/12/20 1213 07/13/20 0400  WBC 8.0 8.1  HGB 14.5 12.6  HCT 41.4 35.1*  MCV 88.1 86.9  PLT 209 657   Basic Metabolic Panel: Recent Labs  Lab 07/12/20 1213 07/13/20 0400  NA 136 137  K 4.2 4.1  CL 103 105  CO2 19* 25  GLUCOSE 133* 122*  BUN 26* 26*  CREATININE 1.60* 1.75*  CALCIUM 9.9 9.2   GFR: Estimated Creatinine Clearance: 30 mL/min (A) (by C-G formula based on SCr of 1.75 mg/dL (H)). Liver Function Tests: No results for input(s): AST, ALT, ALKPHOS, BILITOT, PROT, ALBUMIN in the last 168 hours. No results for input(s): LIPASE, AMYLASE in the last 168 hours. No results for input(s): AMMONIA in the last 168 hours. Coagulation Profile: No results for input(s): INR, PROTIME in the last 168 hours. Cardiac Enzymes: No results for input(s): CKTOTAL, CKMB, CKMBINDEX, TROPONINI in the last 168 hours. BNP (last 3 results) No results for input(s): PROBNP in the last 8760 hours. HbA1C: No results for input(s): HGBA1C in the last 72 hours. CBG: Recent Labs  Lab 07/12/20 2248  GLUCAP 201*   Lipid Profile: No results for input(s): CHOL, HDL, LDLCALC, TRIG, CHOLHDL, LDLDIRECT in the last 72 hours. Thyroid Function Tests: No results for input(s): TSH,  T4TOTAL, FREET4, T3FREE, THYROIDAB in the last 72 hours. Anemia Panel: No results for input(s): VITAMINB12, FOLATE, FERRITIN, TIBC, IRON, RETICCTPCT in the last 72 hours. Sepsis Labs: No results for input(s): PROCALCITON, LATICACIDVEN in the last 168 hours.  Recent Results (from the past 240 hour(s))  SARS Coronavirus 2 by RT PCR (hospital order, performed in [facility] hospital lab) Nasopharyngeal Nasopharyngeal Swab     Status: None   Collection Time: 07/12/20  6:04 PM   Specimen: Nasopharyngeal Swab  Result Value Ref Range  Status   SARS Coronavirus 2 NEGATIVE NEGATIVE Final    Comment: (NOTE) SARS-CoV-2 target nucleic acids are NOT DETECTED.  The SARS-CoV-2 RNA is generally detectable in upper and lower respiratory specimens during the acute phase of infection. The lowest concentration of SARS-CoV-2 viral copies this assay can detect is 250 copies / mL. A negative result does not preclude SARS-CoV-2 infection and should not be used as the sole basis for treatment or other patient management decisions.  A negative result may occur with improper specimen collection / handling, submission of specimen other than nasopharyngeal swab, presence of viral mutation(s) within the areas targeted by this assay, and inadequate number of viral copies (<250 copies / mL). A negative result must be combined with clinical observations, patient history, and epidemiological information.  Fact Sheet for Patients:   StrictlyIdeas.no  Fact Sheet for Healthcare Providers: BankingDealers.co.za  This test is not yet approved or  cleared by the [place] FDA and has been authorized for detection and/or diagnosis of SARS-CoV-2 by FDA under an Emergency Use Authorization (EUA).  This EUA will remain in effect (meaning this test can be used) for the duration of the COVID-19 declaration under Section 564(b)(1) of the Act, 21 U.S.C. section 360bbb-3(b)(1), unless the authorization is terminated or revoked sooner.  Performed at [facility], [address], [place], [place] [address]          Radiology Studies: DG Chest 2 View  Result Date: 07/12/2020 CLINICAL DATA:  Short of breath EXAM: CHEST - 2 VIEW COMPARISON:  06/17/2009 FINDINGS: Pulmonary hyperinflation. No focal infiltrate or effusion. Mild scarring left costophrenic angle. Heart size and vascularity normal. IMPRESSION: Pulmonary hyperinflation without acute abnormality Electronically Signed   By: Franchot Gallo M.D.   On: 07/12/2020 13:06   CT Angio Chest PE W and/or Wo Contrast  Result Date: 07/12/2020 CLINICAL DATA:  Shortness of breath. EXAM: CT ANGIOGRAPHY CHEST WITH CONTRAST TECHNIQUE: Multidetector CT imaging of the chest was performed using the standard protocol during bolus administration of intravenous contrast. Multiplanar CT image reconstructions and MIPs were obtained to evaluate the vascular anatomy. CONTRAST:  23mL OMNIPAQUE IOHEXOL 350 MG/ML SOLN COMPARISON:  Radiograph earlier this day.  Chest CT 03/27/2010 FINDINGS: Cardiovascular: There are no filling defects within the pulmonary arteries to suggest pulmonary embolus. Mild calcified noncalcified aortic atherosclerosis. Irregular plaque in the distal descending thoracic aorta without plaque ulceration or acute findings. No aortic dissection or aneurysm. The heart is normal in size. There are coronary artery calcifications. There is lipomatous hypertrophy of the intra-atrial septum. Mediastinum/Nodes: No mediastinal or hilar adenopathy. No axillary adenopathy. Diminutive thyroid gland without nodule. No esophageal wall thickening. Lungs/Pleura: Scattered atelectasis most prominent in the dependent lungs. No confluent airspace disease or pneumonia. No pulmonary edema or pleural fluid. There is a 4 mm subpleural nodule in the right lower lobe, series 6, image 50. Multiple tiny subpleural nodules in the left upper lobe, series 6 images 20 and 22. Calcified  granuloma in the anterior left upper lobe. Trachea and central bronchi are patent. Upper Abdomen: No acute findings. Musculoskeletal: There are no acute or suspicious osseous abnormalities. Review of the MIP images confirms the above findings. IMPRESSION: 1. No pulmonary embolus or acute intrathoracic abnormality. 2. Coronary artery calcifications. 3. Small pulmonary nodules measuring up to 4 mm, in addition to benign calcified granuloma. No follow-up needed if patient is low-risk (and has no known  or suspected primary neoplasm). Non-contrast chest CT can be considered in 12 months if patient is high-risk. This recommendation follows the consensus statement: Guidelines for Management of Incidental Pulmonary Nodules Detected on CT Images: From the Fleischner Society 2017; Radiology 2017; 284:228-243. Aortic Atherosclerosis (ICD10-I70.0). Electronically Signed   By: Keith Rake M.D.   On: 07/12/2020 18:08        Scheduled Meds: . enoxaparin (LOVENOX) injection  40 mg Subcutaneous Q24H  . glipiZIDE  5 mg Oral Daily  . insulin aspart  0-5 Units Subcutaneous QHS  . insulin aspart  0-6 Units Subcutaneous TID WC  . ipratropium-albuterol  3 mL Nebulization Q6H  . irbesartan  150 mg Oral Daily  . levothyroxine  75 mcg Oral Q0600  . nystatin cream  1 application Topical BID  . pantoprazole  40 mg Oral BID  . simvastatin  20 mg Oral QHS   Continuous Infusions: . sodium chloride 40 mL/hr at 07/12/20 2347     LOS: 0 days    Time spent: 34 mins     Wyvonnia Dusky, MD [facility] Pager [phone]  If 7PM-7AM, please contact night-coverage [email or web address] 07/13/2020, 7:45 AM

## 2020-07-13 NOTE — Progress Notes (Signed)
OT Cancellation Note  Patient Details Name: Erika Nelson MRN: [id number] DOB: 1941-04-28   Cancelled Treatment:    Reason Eval/Treat Not Completed: Patient at procedure or test/ unavailable. Order received and chart reviewed. Upon arrival to pt room, pt just beginning ECHO. Will hold at this time and re-attempt at a later date/time as available and pt medically appropriate for OT evaluation.   Shara Blazing, M.S., OTR/L Ascom: [phone] 07/13/20, 9:03 AM   07/13/2020, 9:02 AM

## 2020-07-13 NOTE — Progress Notes (Signed)
*  PRELIMINARY RESULTS* Echocardiogram 2D Echocardiogram has been performed.  Erika Nelson 07/13/2020, 9:51 AM

## 2020-07-13 NOTE — Evaluation (Signed)
Physical Therapy Evaluation Patient Details Name: Erika Nelson MRN: [id number] DOB: December 14, 1941 Today's Date: 07/13/2020   History of Present Illness  Erika Nelson is a 79 y.o. female with PMH of anxiety, HLD, HTN, diverticulosis, and cervicalgia who presents to the ED with c/o of increasing SOB and weakness.    Clinical Impression  Pt alert, seated EOB at start of session. Reported no pain, did complain/exhibit SOB throughout session, spO2 >94% throughout including through mobility. Pt stated at baseline she lives alone half the year due to her friend going self, modI/I for ADLs, rollator for community ambulation.  The patient demonstrated sit <> stand with RW and CGA/supervision, cueing for hand placement. The patient ambulated ~66ft, seated rest break and then an additional 106ft. Pt exhibited SOB throughout, HR up to low 130s, but recovered with seated break. Two instances of non-sustained vtach showed on monitor, RN notified.  Pt mobility most impacted by SOB and HR. Overall the patient demonstrated deficits (see "PT Problem List") that impede the patient's functional abilities, safety, and mobility and would benefit from skilled PT intervention. Recommendation is HHPT with supervision intermittent for safety.     Follow Up Recommendations Home health PT;Supervision - Intermittent    Equipment Recommendations  Rolling walker with 5" wheels    Recommendations for Other Services       Precautions / Restrictions Precautions Precautions: Fall Precaution Comments: Moderate fall Restrictions Weight Bearing Restrictions: No      Mobility  Bed Mobility Overal bed mobility: Independent             General bed mobility comments: Deferred. Pt seated at EOB upon OT arrival.  Transfers Overall transfer level: Needs assistance Equipment used: Rolling walker (2 wheeled) Transfers: Sit to/from Stand Sit to Stand: Min guard;Supervision             Ambulation/Gait Ambulation/Gait assistance: Min guard Gait Distance (Feet):  (~51ft seated rest break, ~54ft seated rest break) Assistive device: Rolling walker (2 wheeled)   Gait velocity: decreased   General Gait Details: step through gait pattern. SOB throughout. HR in 110s- low 130s, cued for seated rest break when HR was elevated.  Stairs            Wheelchair Mobility    Modified Rankin (Stroke Patients Only)       Balance Overall balance assessment: Needs assistance Sitting-balance support: Feet supported;No upper extremity supported Sitting balance-Leahy Scale: Fair Sitting balance - Comments: Steady static sitting, reaching within BOS.   Standing balance support: During functional activity Standing balance-Leahy Scale: Fair Standing balance comment: Requires BUE support on RW.                             Pertinent Vitals/Pain Pain Assessment: No/denies pain    Home Living Family/patient expects to be discharged to:: Private residence Living Arrangements: Alone (alone 6 months out of the year, roomate home other 6 months) Available Help at Discharge: Family;Available PRN/intermittently Type of Home: House Home Access: Stairs to enter Entrance Stairs-Rails: None Entrance Stairs-Number of Steps: 2 Home Layout: One level Home Equipment: Walker - 4 wheels;Cane - single point      Prior Function Level of Independence: Independent with assistive device(s)         Comments: Pt reports furniture surfing in the home, states home is very small with little room to use an AD for mobility. She uses a 4WW for longer distances. She is generally independent  for BADL management.     Hand Dominance   Dominant Hand: Right    Extremity/Trunk Assessment   Upper Extremity Assessment Upper Extremity Assessment: Generalized weakness    Lower Extremity Assessment Lower Extremity Assessment: Generalized weakness    Cervical / Trunk  Assessment Cervical / Trunk Assessment: Normal  Communication   Communication: No difficulties  Cognition Arousal/Alertness: Awake/alert Behavior During Therapy: WFL for tasks assessed/performed Overall Cognitive Status: Within Functional Limits for tasks assessed                                        General Comments General comments (skin integrity, edema, etc.): vitals monitored throughou, RA >95% throughout. HR ranged 110s-131, elevated with exertion. Cued for seated break and improved to 110s with rest.    Exercises Other Exercises Other Exercises: Pt educated on falls prevention strategies, safe use of AE/DME for ADL management, energy conservation strategies including activity pacing, pursed lip breathing, and routines modifications to support safety and functional independence upon hospital DC.   Assessment/Plan    PT Assessment Patient needs continued PT services  PT Problem List Decreased mobility;Cardiopulmonary status limiting activity;Decreased knowledge of precautions;Decreased activity tolerance       PT Treatment Interventions DME instruction;Balance training;Gait training;Neuromuscular re-education;Functional mobility training;Patient/family education;Stair training;Therapeutic activities;Therapeutic exercise    PT Goals (Current goals can be found in the Care Plan section)  Acute Rehab PT Goals Patient Stated Goal: To get stronger and be more independent. PT Goal Formulation: With patient Time For Goal Achievement: 07/27/20 Potential to Achieve Goals: Good    Frequency Min 2X/week   Barriers to discharge        Co-evaluation   Reason for Co-Treatment: For patient/therapist safety;To address functional/ADL transfers PT goals addressed during session: Mobility/safety with mobility;Proper use of DME OT goals addressed during session: ADL's and self-care;Proper use of Adaptive equipment and DME       AM-PAC PT "6 Clicks" Mobility   Outcome Measure Help needed turning from your back to your side while in a flat bed without using bedrails?: None Help needed moving from lying on your back to sitting on the side of a flat bed without using bedrails?: None Help needed moving to and from a bed to a chair (including a wheelchair)?: None Help needed standing up from a chair using your arms (e.g., wheelchair or bedside chair)?: None Help needed to walk in hospital room?: A Little Help needed climbing 3-5 steps with a railing? : A Lot 6 Click Score: 21    End of Session Equipment Utilized During Treatment: Gait belt Activity Tolerance: Patient tolerated treatment well;Patient limited by fatigue Patient left: in chair;with call bell/phone within reach;with chair alarm set Nurse Communication: Mobility status PT Visit Diagnosis: Muscle weakness (generalized) (M62.81);Other symptoms and signs involving the nervous system (R29.898);Difficulty in walking, not elsewhere classified (R26.2)    Time: 3244-0102 PT Time Calculation (min) (ACUTE ONLY): 36 min   Charges:   PT Evaluation $PT Eval Low Complexity: 1 Low PT Treatments $Therapeutic Exercise: 8-22 mins        Lieutenant Diego PT, DPT 12:46 PM,07/13/20

## 2020-07-13 NOTE — Evaluation (Signed)
Occupational Therapy Evaluation Patient Details Name: Erika Nelson MRN: [id number] DOB: 09-Dec-1941 Today's Date: 07/13/2020    History of Present Illness Erika Nelson is a 79 y.o. female with PMH of anxiety, HLD, HTN, diverticulosis, and cervicalgia who presents to the ED with c/o of increasing SOB and weakness.   Clinical Impression   Pt seen for OT/PT co-evaluation this date. Pt was independent in all ADL and functional mobility, living in a 1 story home with 2 STE. Pt denies use of supplemental O2 in the home. Pt reports becoming easily fatigued or out of breath with minimal exertion. Pt currently requires MIN A for exertional ADL management including functional mobility, LB bathing, and dressing tasks due to current functional impairments (See OT Problem List below). Pt educated in energy conservation strategies including pursed lip breathing, activity pacing, home/routines modifications, work simplification, AE/DME, prioritizing of meaningful occupations, and falls prevention. Handout provided. Pt verbalized understanding and would benefit from additional skilled OT services to maximize recall and carryover of learned techniques and facilitate implementation of learned techniques into daily routines. Upon discharge, recommend [facility] services.       Follow Up Recommendations  Home health OT    Equipment Recommendations  3 in 1 bedside commode    Recommendations for Other Services       Precautions / Restrictions Precautions Precautions: Fall Precaution Comments: Moderate fall Restrictions Weight Bearing Restrictions: No      Mobility Bed Mobility               General bed mobility comments: Deferred. Pt seated at EOB upon OT arrival.  Transfers Overall transfer level: Needs assistance Equipment used: Rolling walker (2 wheeled) Transfers: Sit to/from Stand Sit to Stand: Min guard;Supervision              Balance Overall balance assessment: Needs  assistance Sitting-balance support: Feet supported;No upper extremity supported Sitting balance-Leahy Scale: Fair Sitting balance - Comments: Steady static sitting, reaching within BOS.   Standing balance support: During functional activity Standing balance-Leahy Scale: Fair Standing balance comment: Requires BUE support on RW.                           ADL either performed or assessed with clinical judgement   ADL Overall ADL's : Needs assistance/impaired                                       General ADL Comments: Pt functionally limited by generalized weakness, decreased activity tolerance, and cardiopulmonary status. She requires CGA for functional mobility with frequent therapeutic rest breaks to walk longer distances with a 2WW this date. Anticipate MIN A for LB ADL management. Pt with significant decrease in endurance and HR reaching >125 bpm with light activity.     Vision Baseline Vision/History: Wears glasses Wears Glasses: At all times Patient Visual Report: No change from baseline       Perception     Praxis      Pertinent Vitals/Pain Pain Assessment: No/denies pain     Hand Dominance Right   Extremity/Trunk Assessment Upper Extremity Assessment Upper Extremity Assessment: Generalized weakness   Lower Extremity Assessment Lower Extremity Assessment: Generalized weakness       Communication Communication Communication: No difficulties   Cognition Arousal/Alertness: Awake/alert Behavior During Therapy: WFL for tasks assessed/performed Overall Cognitive Status: Within Functional Limits for tasks assessed  General Comments  Pt vitals monitored during session. Pt remains on RA with spO2 WFL >95% t/o evaluation. With activity, pt HR is noted to reach upper 120's, but improves to 110's with therapeutic rest break. Pt noted with tachypnea t/o session.    Exercises Other  Exercises Other Exercises: Pt educated on falls prevention strategies, safe use of AE/DME for ADL management, energy conservation strategies including activity pacing, pursed lip breathing, and routines modifications to support safety and functional independence upon hospital DC.   Shoulder Instructions      Home Living Family/patient expects to be discharged to:: Private residence Living Arrangements: Alone (alone 6 months out of the year, roomate home other 6 months) Available Help at Discharge: Family;Available PRN/intermittently Type of Home: House Home Access: Stairs to enter [employer] of Steps: 2 Entrance Stairs-Rails: None Home Layout: One level     Bathroom Shower/Tub: Teacher, early years/pre: Standard Bathroom Accessibility: Yes How Accessible: Accessible via walker Home Equipment: [place] - 4 wheels;Cane - single point          Prior Functioning/Environment Level of Independence: Independent with assistive device(s)        Comments: Pt reports furniture surfing in the home, states home is very small with little room to use an AD for mobility. She uses a 4WW for longer distances. She is generally independent for BADL management.        OT Problem List: Decreased strength;Decreased coordination;Decreased activity tolerance;Decreased safety awareness;Impaired balance (sitting and/or standing);Decreased knowledge of use of DME or AE;Cardiopulmonary status limiting activity      OT Treatment/Interventions: Self-care/ADL training;Therapeutic exercise;Therapeutic activities;DME and/or AE instruction;Patient/family education;Balance training;Energy conservation    OT Goals(Current goals can be found in the care plan section) Acute Rehab OT Goals Patient Stated Goal: To get stronger and be more independent. OT Goal Formulation: With patient Time For Goal Achievement: 07/27/20 Potential to Achieve Goals: Good ADL Goals Pt Will Perform Grooming:  sitting;with modified independence Pt Will Perform Lower Body Dressing: sit to/from stand;with adaptive equipment;with modified independence (c LRAD PRN for improved safety and functional indep.) Pt Will Transfer to Toilet: ambulating;with modified independence;bedside commode (c LRAD PRN for improved safety and functional indep.) Pt Will Perform Toileting - Clothing Manipulation and hygiene: sit to/from stand;with modified independence;with adaptive equipment (c LRAD PRN for improved safety and functional indep.) Additional ADL Goal #1: Pt will independently verbalize a plan to implement at least 3 learned energy conservation strategies into her daily routines/home environment for improved safety and functional independence upon hospital DC.  OT Frequency: Min 2X/week   Barriers to D/C: Decreased caregiver support          Co-evaluation PT/OT/SLP Co-Evaluation/Treatment: Yes Reason for Co-Treatment: For patient/therapist safety;To address functional/ADL transfers PT goals addressed during session: Mobility/safety with mobility;Proper use of DME;Balance OT goals addressed during session: ADL's and self-care;Proper use of Adaptive equipment and DME      AM-PAC OT "6 Clicks" Daily Activity     Outcome Measure Help from another person eating meals?: None Help from another person taking care of personal grooming?: A Little Help from another person toileting, which includes using toliet, bedpan, or urinal?: A Little Help from another person bathing (including washing, rinsing, drying)?: A Little Help from another person to put on and taking off regular upper body clothing?: A Little Help from another person to put on and taking off regular lower body clothing?: A Little 6 Click Score: 19   End of Session Equipment Utilized During  Treatment: Gait belt;Rolling walker  Activity Tolerance: Patient tolerated treatment well Patient left: in chair;with call bell/phone within reach;with chair  alarm set;with nursing/sitter in room  OT Visit Diagnosis: Other abnormalities of gait and mobility (R26.89);Muscle weakness (generalized) (M62.81)                Time: 7199-4129 OT Time Calculation (min): 35 min Charges:  OT General Charges $OT Visit: 1 Visit OT Evaluation $OT Eval Moderate Complexity: 1 Mod OT Treatments $Self Care/Home Management : 8-22 mins  Shara Blazing, M.S., OTR/L Ascom: [phone] 07/13/20, 12:26 PM

## 2020-07-14 DIAGNOSIS — N1832 Chronic kidney disease, stage 3b: Secondary | ICD-10-CM

## 2020-07-14 DIAGNOSIS — J45901 Unspecified asthma with (acute) exacerbation: Secondary | ICD-10-CM

## 2020-07-14 LAB — BASIC METABOLIC PANEL
Anion gap: 15 (ref 5–15)
BUN: 24 mg/dL — ABNORMAL HIGH (ref 8–23)
CO2: 23 mmol/L (ref 22–32)
Calcium: 10 mg/dL (ref 8.9–10.3)
Chloride: 100 mmol/L (ref 98–111)
Creatinine, Ser: 1.62 mg/dL — ABNORMAL HIGH (ref 0.44–1.00)
GFR calc Af Amer: 35 mL/min — ABNORMAL LOW (ref >60)
GFR calc non Af Amer: 30 mL/min — ABNORMAL LOW (ref >60)
Glucose, Bld: 134 mg/dL — ABNORMAL HIGH (ref 70–99)
Potassium: 4.1 mmol/L (ref 3.5–5.1)
Sodium: 138 mmol/L (ref 135–145)

## 2020-07-14 LAB — CBC
HCT: 39.8 % (ref 36.0–46.0)
Hemoglobin: 13.7 g/dL (ref 12.0–15.0)
MCH: 30.5 pg (ref 26.0–34.0)
MCHC: 34.4 g/dL (ref 30.0–36.0)
MCV: 88.6 fL (ref 80.0–100.0)
Platelets: 191 10*3/uL (ref 150–400)
RBC: 4.49 MIL/uL (ref 3.87–5.11)
RDW: 13 % (ref 11.5–15.5)
WBC: 6.5 10*3/uL (ref 4.0–10.5)
nRBC: 0 % (ref 0.0–0.2)

## 2020-07-14 LAB — GLUCOSE, CAPILLARY
Glucose-Capillary: 146 mg/dL — ABNORMAL HIGH (ref 70–99)
Glucose-Capillary: 134 mg/dL — ABNORMAL HIGH (ref 70–99)
Glucose-Capillary: 173 mg/dL — ABNORMAL HIGH (ref 70–99)

## 2020-07-14 NOTE — Progress Notes (Signed)
FVC 3.35 118.9% of predicted FEV1 2.62 124.6% of predicted FEV1/FVC 78.3 105.6% of predicted  The pt gave good effort. The hard copy is in her chart.

## 2020-07-14 NOTE — Progress Notes (Signed)
Discharged via private vehicle. Discharge instructions explained and given to pt. No further questions at this time. No s/s of distress noted. IV and tele monitoring removed.

## 2020-07-14 NOTE — Progress Notes (Signed)
Pulmonary Medicine          Date: 07/14/2020,   MRN# [id number] Erika Nelson 1941/07/08     AdmissionWeight: 90.7 kg                 CurrentWeight: 94.1 kg      CHIEF COMPLAINT:   Acute on chronic hypoxemic respiratory failure   SUBJECTIVE   Patient had PFT done , I reviewed this and its consistent with asthma only without COPD. She has significnant atelectasis bilaterally , this is due to intestitial edema with deconditioning. We reviewd post d/c care plan and I will set up follow up at [facility].   PAST MEDICAL HISTORY   Past Medical History:  Diagnosis Date  . Anxiety   . Cervicalgia   . Diverticulosis   . Generalized OA    Osteoarthritis bilateral knees  . GERD (gastroesophageal reflux disease)   . Headache   . Hyperlipidemia   . Hypertension   . Occasional tremors      SURGICAL HISTORY   Past Surgical History:  Procedure Laterality Date  . bilateral foot surgery Bilateral   . COLONOSCOPY WITH PROPOFOL N/A 03/12/2016   Procedure: COLONOSCOPY WITH PROPOFOL;  Surgeon: Lollie Sails, MD;  Location: [facility] ENDOSCOPY;  Service: Endoscopy;  Laterality: N/A;  . KNEE ARTHROSCOPY Left   . TOTAL KNEE ARTHROPLASTY Right 07/26/2016   Procedure: TOTAL KNEE ARTHROPLASTY;  Surgeon: Hessie Knows, MD;  Location: [facility] ORS;  Service: Orthopedics;  Laterality: Right;     FAMILY HISTORY   Family History  Problem Relation Age of Onset  . Colon cancer Father   . Cirrhosis Mother   . Breast cancer Paternal Grandmother   . Stroke Brother   . Breast cancer Paternal Aunt      SOCIAL HISTORY   Social History   Tobacco Use  . Smoking status: Never Smoker  . Smokeless tobacco: Never Used  Substance Use Topics  . Alcohol use: No  . Drug use: No     MEDICATIONS    Home Medication:    Current Medication:  Current Facility-Administered Medications:  .  acetaminophen (TYLENOL) tablet 650 mg, 650 mg, Oral, Q6H PRN, 650 mg at 07/13/20 0932 **OR**  acetaminophen (TYLENOL) suppository 650 mg, 650 mg, Rectal, Q6H PRN, Wieting, Richard, MD .  ALPRAZolam Duanne Moron) tablet 0.125 mg, 0.125 mg, Oral, TID PRN, Leslye Peer, Richard, MD .  enoxaparin (LOVENOX) injection 40 mg, 40 mg, Subcutaneous, Q24H, Wieting, Richard, MD, 40 mg at 07/13/20 2102 .  fluticasone furoate-vilanterol (BREO ELLIPTA) 100-25 MCG/INH 1 puff, 1 puff, Inhalation, Daily, 1 puff at 07/14/20 0833 **AND** umeclidinium bromide (INCRUSE ELLIPTA) 62.5 MCG/INH 1 puff, 1 puff, Inhalation, Daily, Wyvonnia Dusky, MD, 1 puff at 07/14/20 9892 .  hydrALAZINE (APRESOLINE) tablet 50 mg, 50 mg, Oral, Q6H PRN, Wyvonnia Dusky, MD .  insulin aspart (novoLOG) injection 0-5 Units, 0-5 Units, Subcutaneous, QHS, Loletha Grayer, MD, 2 Units at 07/12/20 2303 .  insulin aspart (novoLOG) injection 0-6 Units, 0-6 Units, Subcutaneous, TID WC, Loletha Grayer, MD, 1 Units at 07/13/20 1714 .  ipratropium (ATROVENT) nebulizer solution 0.5 mg, 0.5 mg, Nebulization, TID, Wyvonnia Dusky, MD, 0.5 mg at 07/14/20 1436 .  irbesartan (AVAPRO) tablet 150 mg, 150 mg, Oral, Daily, Leslye Peer, Richard, MD, 150 mg at 07/14/20 0832 .  levothyroxine (SYNTHROID) tablet 75 mcg, 75 mcg, Oral, Q0600, Loletha Grayer, MD, 75 mcg at 07/14/20 0617 .  nystatin cream (MYCOSTATIN) 1 application, 1 application, Topical, BID, Wieting,  Richard, MD, 1 application at 77/11/65 [phone] .  ondansetron (ZOFRAN) tablet 4 mg, 4 mg, Oral, Q6H PRN **OR** ondansetron (ZOFRAN) injection 4 mg, 4 mg, Intravenous, Q6H PRN, Wieting, Richard, MD .  pantoprazole (PROTONIX) EC tablet 40 mg, 40 mg, Oral, BID, Leslye Peer, Richard, MD, 40 mg at 07/14/20 8333 .  simvastatin (ZOCOR) tablet 20 mg, 20 mg, Oral, QHS, Wieting, Richard, MD, 20 mg at 07/13/20 2103    ALLERGIES   Albuterol, Norco [hydrocodone-acetaminophen], Prozac [fluoxetine hcl], Sulfa antibiotics, and Tramadol     REVIEW OF SYSTEMS    Review of Systems:  Gen:  Denies  fever, sweats,  chills weigh loss  HEENT: Denies blurred vision, double vision, ear pain, eye pain, hearing loss, nose bleeds, sore throat Cardiac:  No dizziness, chest pain or heaviness, chest tightness,edema Resp:   Denies cough or sputum porduction, shortness of breath,wheezing, hemoptysis,  Gi: Denies swallowing difficulty, stomach pain, nausea or vomiting, diarrhea, constipation, bowel incontinence Gu:  Denies bladder incontinence, burning urine Ext:   Denies Joint pain, stiffness or swelling Skin: Denies  skin rash, easy bruising or bleeding or hives Endoc:  Denies polyuria, polydipsia , polyphagia or weight change Psych:   Denies depression, insomnia or hallucinations   Other:  All other systems negative   VS: BP (!) 147/82 (BP Location: Right Arm)   Pulse 80   Temp 98 F (36.7 C) (Oral)   Resp (!) 21   Ht $R'5\' 5"'hj$  (1.651 m)   Wt 94.1 kg   SpO2 99%   BMI 34.51 kg/m      PHYSICAL EXAM    GENERAL:NAD, no fevers, chills, no weakness no fatigue HEAD: Normocephalic, atraumatic.  EYES: Pupils equal, round, reactive to light. Extraocular muscles intact. No scleral icterus.  MOUTH: Moist mucosal membrane. Dentition intact. No abscess noted.  EAR, NOSE, THROAT: Clear without exudates. No external lesions.  NECK: Supple. No thyromegaly. No nodules. No JVD.  PULMONARY: Diffuse coarse rhonchi right sided +wheezes CARDIOVASCULAR: S1 and S2. Regular rate and rhythm. No murmurs, rubs, or gallops. No edema. Pedal pulses 2+ bilaterally.  GASTROINTESTINAL: Soft, nontender, nondistended. No masses. Positive bowel sounds. No hepatosplenomegaly.  MUSCULOSKELETAL: No swelling, clubbing, or edema. Range of motion full in all extremities.  NEUROLOGIC: Cranial nerves II through XII are intact. No gross focal neurological deficits. Sensation intact. Reflexes intact.  SKIN: No ulceration, lesions, rashes, or cyanosis. Skin warm and dry. Turgor intact.  PSYCHIATRIC: Mood, affect within normal limits. The patient  is awake, alert and oriented x 3. Insight, judgment intact.       IMAGING    DG Chest 2 View  Result Date: 07/12/2020 CLINICAL DATA:  Short of breath EXAM: CHEST - 2 VIEW COMPARISON:  06/17/2009 FINDINGS: Pulmonary hyperinflation. No focal infiltrate or effusion. Mild scarring left costophrenic angle. Heart size and vascularity normal. IMPRESSION: Pulmonary hyperinflation without acute abnormality Electronically Signed   By: Franchot Gallo M.D.   On: 07/12/2020 13:06   CT Angio Chest PE W and/or Wo Contrast  Result Date: 07/12/2020 CLINICAL DATA:  Shortness of breath. EXAM: CT ANGIOGRAPHY CHEST WITH CONTRAST TECHNIQUE: Multidetector CT imaging of the chest was performed using the standard protocol during bolus administration of intravenous contrast. Multiplanar CT image reconstructions and MIPs were obtained to evaluate the vascular anatomy. CONTRAST:  60mL OMNIPAQUE IOHEXOL 350 MG/ML SOLN COMPARISON:  Radiograph earlier this day.  Chest CT 03/27/2010 FINDINGS: Cardiovascular: There are no filling defects within the pulmonary arteries to suggest pulmonary embolus. Mild calcified noncalcified aortic  atherosclerosis. Irregular plaque in the distal descending thoracic aorta without plaque ulceration or acute findings. No aortic dissection or aneurysm. The heart is normal in size. There are coronary artery calcifications. There is lipomatous hypertrophy of the intra-atrial septum. Mediastinum/Nodes: No mediastinal or hilar adenopathy. No axillary adenopathy. Diminutive thyroid gland without nodule. No esophageal wall thickening. Lungs/Pleura: Scattered atelectasis most prominent in the dependent lungs. No confluent airspace disease or pneumonia. No pulmonary edema or pleural fluid. There is a 4 mm subpleural nodule in the right lower lobe, series 6, image 50. Multiple tiny subpleural nodules in the left upper lobe, series 6 images 20 and 22. Calcified granuloma in the anterior left upper lobe. Trachea and  central bronchi are patent. Upper Abdomen: No acute findings. Musculoskeletal: There are no acute or suspicious osseous abnormalities. Review of the MIP images confirms the above findings. IMPRESSION: 1. No pulmonary embolus or acute intrathoracic abnormality. 2. Coronary artery calcifications. 3. Small pulmonary nodules measuring up to 4 mm, in addition to benign calcified granuloma. No follow-up needed if patient is low-risk (and has no known or suspected primary neoplasm). Non-contrast chest CT can be considered in 12 months if patient is high-risk. This recommendation follows the consensus statement: Guidelines for Management of Incidental Pulmonary Nodules Detected on CT Images: From the Fleischner Society 2017; Radiology 2017; 284:228-243. Aortic Atherosclerosis (ICD10-I70.0). Electronically Signed   By: Keith Rake M.D.   On: 07/12/2020 18:08   ECHOCARDIOGRAM COMPLETE  Result Date: 07/13/2020    ECHOCARDIOGRAM REPORT   Patient Name:   Erika Nelson Date of Exam: 07/13/2020 Medical Rec #:  [id number]     Height:       65.0 in Accession #:    [id number]    Weight:       207.4 lb Date of Birth:  02-02-1941     BSA:          2.009 m Patient Age:    79 years      BP:           131/63 mmHg Patient Gender: F             HR:           104 bpm. Exam Location:  [facility] Procedure: 2D Echo, Cardiac Doppler, Color Doppler and Intracardiac            Opacification Agent Indications:     R06.00 Dyspnea  History:         Patient has no prior history of Echocardiogram examinations.                  Risk Factors:Hypertension and Dyslipidemia.  Sonographer:     Charmayne Sheer RDCS (AE) Referring Phys:  [id number] Loletha Grayer Diagnosing Phys: Yolonda Kida MD  Sonographer Comments: Technically difficult study due to poor echo windows. Image acquisition challenging due to patient body habitus. IMPRESSIONS  1. Left ventricular ejection fraction, by estimation, is 60 to 65%. The left ventricle has normal function. The left  ventricle demonstrates regional wall motion abnormalities (see scoring diagram/findings for description). Left ventricular diastolic parameters are consistent with Grade I diastolic dysfunction (impaired relaxation).  2. Right ventricular systolic function is normal. The right ventricular size is normal.  3. The mitral valve is normal in structure. No evidence of mitral valve regurgitation.  4. The aortic valve is normal in structure. Aortic valve regurgitation is not visualized. FINDINGS  Left Ventricle: Left ventricular ejection fraction, by estimation, is 60 to 65%. The left  ventricle has normal function. The left ventricle demonstrates regional wall motion abnormalities. Definity contrast agent was given IV to delineate the left ventricular endocardial borders. The left ventricular internal cavity size was normal in size. There is borderline left ventricular hypertrophy. Left ventricular diastolic parameters are consistent with Grade I diastolic dysfunction (impaired relaxation). Right Ventricle: The right ventricular size is normal. No increase in right ventricular wall thickness. Right ventricular systolic function is normal. Left Atrium: Left atrial size was normal in size. Right Atrium: Right atrial size was normal in size. Pericardium: There is no evidence of pericardial effusion. Mitral Valve: The mitral valve is normal in structure. No evidence of mitral valve regurgitation. MV peak gradient, 10.0 mmHg. The mean mitral valve gradient is 5.0 mmHg. Tricuspid Valve: The tricuspid valve is normal in structure. Tricuspid valve regurgitation is not demonstrated. Aortic Valve: The aortic valve is normal in structure. Aortic valve regurgitation is not visualized. Aortic valve mean gradient measures 4.0 mmHg. Aortic valve peak gradient measures 7.5 mmHg. Aortic valve area, by VTI measures 2.72 cm. Pulmonic Valve: The pulmonic valve was normal in structure. Pulmonic valve regurgitation is not visualized. Aorta:  The aortic root is normal in size and structure. IAS/Shunts: No atrial level shunt detected by color flow Doppler.  LEFT VENTRICLE PLAX 2D LVIDd:         3.79 cm  Diastology LVIDs:         3.02 cm  LV e' lateral:   9.25 cm/s LV PW:         1.01 cm  LV E/e' lateral: 10.6 LV IVS:        0.80 cm  LV e' medial:    6.85 cm/s LVOT diam:     2.20 cm  LV E/e' medial:  14.3 LV SV:         67 LV SV Index:   33 LVOT Area:     3.80 cm  LEFT ATRIUM             Index LA diam:        2.70 cm 1.34 cm/m LA Vol (A2C):   45.5 ml 22.65 ml/m LA Vol (A4C):   57.9 ml 28.82 ml/m LA Biplane Vol: 51.7 ml 25.74 ml/m  AORTIC VALVE                   PULMONIC VALVE AV Area (Vmax):    2.61 cm    PV Vmax:       1.28 m/s AV Area (Vmean):   2.71 cm    PV Vmean:      85.900 cm/s AV Area (VTI):     2.72 cm    PV VTI:        0.241 m AV Vmax:           137.00 cm/s PV Peak grad:  6.6 mmHg AV Vmean:          88.700 cm/s PV Mean grad:  3.0 mmHg AV VTI:            0.245 m AV Peak Grad:      7.5 mmHg AV Mean Grad:      4.0 mmHg LVOT Vmax:         94.20 cm/s LVOT Vmean:        63.200 cm/s LVOT VTI:          0.175 m LVOT/AV VTI ratio: 0.71  AORTA Ao Root diam: 3.00 cm MITRAL VALVE MV Area (PHT): 6.32 cm  SHUNTS MV Peak grad:  10.0 mmHg    Systemic VTI:  0.18 m MV Mean grad:  5.0 mmHg     Systemic Diam: 2.20 cm MV Vmax:       1.58 m/s MV Vmean:      107.0 cm/s MV Decel Time: 120 msec MV E velocity: 97.90 cm/s MV A velocity: 144.00 cm/s MV E/A ratio:  0.68 Dwayne D Callwood MD Electronically signed by Yolonda Kida MD Signature Date/Time: 07/13/2020/1:28:49 PM    Final       ASSESSMENT/PLAN    Acute on chronic hypoxemic respiratory failure   - due to mild exacerbation of Asthma with bibasilar atelectasis and concurrent worsening renal failure.    - patient will need chronic diuresis and renal follow up   - will work with patient on aggressive bronchopulmonary hygiene   - patient has had high emotional distress after loss of family  member   - contineu incruse ICS/LABA as previous   -Incentive spirometer to be taken home and used daily multiple times per hour while at rest   Moderate persistent asthma  - may restart home regimen  - will continue care on outpatient with [facility] pulmonary   Bilateral atelectasis  - IS for home use  - MetaNEB for home use will discuss with [place]      Thank you for allowing me to participate in the care of this patient.    Patient/Family are satisfied with care plan and all questions have been answered.   This document was prepared using Dragon voice recognition software and may include unintentional dictation errors.     Ottie Glazier, M.D.  Division of [place]

## 2020-07-14 NOTE — Progress Notes (Signed)
PROGRESS NOTE    Erika Nelson  VHQ:[id number] DOB: 07-02-1941 DOA: 07/12/2020 PCP: Rusty Aus, MD    Assessment & Plan:   Active Problems:   Shortness of breath   Dyspnea   Dyspnea: improving. Etiology unclear, possibly secondary to CHF exacerbation vs asthma exacerbation. BNP 77.2 & no LE edema, so likely not CHF exacerbation. CTA chest was neg for PE but pulmonary nodules that need repeat CT scan in 12 months. Echo shows EF 41-32%, grade I diastolic dysfunction  Possible asthma exacerbation: continue on home bronchodilators or hospital substitutes. Pulmon consulted   HTN: continue on irbesartan  DM2: hold home dose of metformin, glipizide. Continue on SSI w/ accuchecks   HLD: continue on statin  Hypothyroidism: continue on home dose of levothyroxine  CKDIIIb: stable. Will continue to monitor    DVT prophylaxis: lovenox  Code Status: full  Family Communication: discussed pt's care w/ pt's son via phone and answerd his questions Disposition Plan: likely home health. Awaiting recs from pulmon and can d/c when ok w/ pulmon    Consultants:      Procedures:    Antimicrobials:   Subjective: Pt c/o shortness of breath but slightly improved from the day prior.   Objective: Vitals:   07/14/20 0412 07/14/20 0821 07/14/20 1233 07/14/20 1436  BP: (!) 142/79 (!) 153/65 (!) 147/82   Pulse: 80     Resp:  19 (!) 21   Temp: (!) 97.5 F (36.4 C) 97.8 F (36.6 C) 98 F (36.7 C)   TempSrc: Oral Oral Oral   SpO2: 99%   99%  Weight:      Height:        Intake/Output Summary (Last 24 hours) at 07/14/2020 1507 Last data filed at 07/14/2020 0520 Gross per 24 hour  Intake 0 ml  Output 2550 ml  Net -2550 ml   Filed Weights   07/12/20 1210 07/12/20 2029  Weight: 90.7 kg 94.1 kg    Examination:  General exam: Appears calm and comfortable  Respiratory system: diminished breath sounds b/l. No rales. Cardiovascular system: S1 & S2+. No rubs, gallops or  clicks. Gastrointestinal system: Abdomen is nondistended, soft and nontender. Normal bowel sounds heard. Central nervous system: Alert and oriented. Moves all 4 extremities Psychiatry: Judgement and insight appear normal. Mood & affect appropriate.     Data Reviewed: I have personally reviewed following labs and imaging studies  CBC: Recent Labs  Lab 07/12/20 1213 07/13/20 0400 07/14/20 0816  WBC 8.0 8.1 6.5  HGB 14.5 12.6 13.7  HCT 41.4 35.1* 39.8  MCV 88.1 86.9 88.6  PLT 209 176 440   Basic Metabolic Panel: Recent Labs  Lab 07/12/20 1213 07/13/20 0400 07/14/20 0816  NA 136 137 138  K 4.2 4.1 4.1  CL 103 105 100  CO2 19* 25 23  GLUCOSE 133* 122* 134*  BUN 26* 26* 24*  CREATININE 1.60* 1.75* 1.62*  CALCIUM 9.9 9.2 10.0   GFR: Estimated Creatinine Clearance: 32.4 mL/min (A) (by C-G formula based on SCr of 1.62 mg/dL (H)). Liver Function Tests: No results for input(s): AST, ALT, ALKPHOS, BILITOT, PROT, ALBUMIN in the last 168 hours. No results for input(s): LIPASE, AMYLASE in the last 168 hours. No results for input(s): AMMONIA in the last 168 hours. Coagulation Profile: No results for input(s): INR, PROTIME in the last 168 hours. Cardiac Enzymes: No results for input(s): CKTOTAL, CKMB, CKMBINDEX, TROPONINI in the last 168 hours. BNP (last 3 results) No results for input(s): PROBNP  in the last 8760 hours. HbA1C: Recent Labs    07/12/20 1804  HGBA1C 6.5*   CBG: Recent Labs  Lab 07/13/20 1114 07/13/20 1653 07/13/20 2119 07/14/20 0819 07/14/20 1231  GLUCAP 159* 173* 169* 146* 134*   Lipid Profile: No results for input(s): CHOL, HDL, LDLCALC, TRIG, CHOLHDL, LDLDIRECT in the last 72 hours. Thyroid Function Tests: No results for input(s): TSH, T4TOTAL, FREET4, T3FREE, THYROIDAB in the last 72 hours. Anemia Panel: No results for input(s): VITAMINB12, FOLATE, FERRITIN, TIBC, IRON, RETICCTPCT in the last 72 hours. Sepsis Labs: No results for input(s):  PROCALCITON, LATICACIDVEN in the last 168 hours.  Recent Results (from the past 240 hour(s))  SARS Coronavirus 2 by RT PCR (hospital order, performed in [facility] hospital lab) Nasopharyngeal Nasopharyngeal Swab     Status: None   Collection Time: 07/12/20  6:04 PM   Specimen: Nasopharyngeal Swab  Result Value Ref Range Status   SARS Coronavirus 2 NEGATIVE NEGATIVE Final    Comment: (NOTE) SARS-CoV-2 target nucleic acids are NOT DETECTED.  The SARS-CoV-2 RNA is generally detectable in upper and lower respiratory specimens during the acute phase of infection. The lowest concentration of SARS-CoV-2 viral copies this assay can detect is 250 copies / mL. A negative result does not preclude SARS-CoV-2 infection and should not be used as the sole basis for treatment or other patient management decisions.  A negative result may occur with improper specimen collection / handling, submission of specimen other than nasopharyngeal swab, presence of viral mutation(s) within the areas targeted by this assay, and inadequate number of viral copies (<250 copies / mL). A negative result must be combined with clinical observations, patient history, and epidemiological information.  Fact Sheet for Patients:   StrictlyIdeas.no  Fact Sheet for Healthcare Providers: BankingDealers.co.za  This test is not yet approved or  cleared by the [place] FDA and has been authorized for detection and/or diagnosis of SARS-CoV-2 by FDA under an Emergency Use Authorization (EUA).  This EUA will remain in effect (meaning this test can be used) for the duration of the COVID-19 declaration under Section 564(b)(1) of the Act, 21 U.S.C. section 360bbb-3(b)(1), unless the authorization is terminated or revoked sooner.  Performed at [facility], [address], [place], [place] [address]          Radiology Studies: CT Angio Chest PE W and/or Wo  Contrast  Result Date: 07/12/2020 CLINICAL DATA:  Shortness of breath. EXAM: CT ANGIOGRAPHY CHEST WITH CONTRAST TECHNIQUE: Multidetector CT imaging of the chest was performed using the standard protocol during bolus administration of intravenous contrast. Multiplanar CT image reconstructions and MIPs were obtained to evaluate the vascular anatomy. CONTRAST:  83mL OMNIPAQUE IOHEXOL 350 MG/ML SOLN COMPARISON:  Radiograph earlier this day.  Chest CT 03/27/2010 FINDINGS: Cardiovascular: There are no filling defects within the pulmonary arteries to suggest pulmonary embolus. Mild calcified noncalcified aortic atherosclerosis. Irregular plaque in the distal descending thoracic aorta without plaque ulceration or acute findings. No aortic dissection or aneurysm. The heart is normal in size. There are coronary artery calcifications. There is lipomatous hypertrophy of the intra-atrial septum. Mediastinum/Nodes: No mediastinal or hilar adenopathy. No axillary adenopathy. Diminutive thyroid gland without nodule. No esophageal wall thickening. Lungs/Pleura: Scattered atelectasis most prominent in the dependent lungs. No confluent airspace disease or pneumonia. No pulmonary edema or pleural fluid. There is a 4 mm subpleural nodule in the right lower lobe, series 6, image 50. Multiple tiny subpleural nodules in the left upper lobe, series 6  images 20 and 22. Calcified granuloma in the anterior left upper lobe. Trachea and central bronchi are patent. Upper Abdomen: No acute findings. Musculoskeletal: There are no acute or suspicious osseous abnormalities. Review of the MIP images confirms the above findings. IMPRESSION: 1. No pulmonary embolus or acute intrathoracic abnormality. 2. Coronary artery calcifications. 3. Small pulmonary nodules measuring up to 4 mm, in addition to benign calcified granuloma. No follow-up needed if patient is low-risk (and has no known or suspected primary neoplasm). Non-contrast chest CT can be  considered in 12 months if patient is high-risk. This recommendation follows the consensus statement: Guidelines for Management of Incidental Pulmonary Nodules Detected on CT Images: From the Fleischner Society 2017; Radiology 2017; 284:228-243. Aortic Atherosclerosis (ICD10-I70.0). Electronically Signed   By: Keith Rake M.D.   On: 07/12/2020 18:08   ECHOCARDIOGRAM COMPLETE  Result Date: 07/13/2020    ECHOCARDIOGRAM REPORT   Patient Name:   ZERENITY BOWRON Date of Exam: 07/13/2020 Medical Rec #:  [id number]     Height:       65.0 in Accession #:    [id number]    Weight:       207.4 lb Date of Birth:  1941/04/14     BSA:          2.009 m Patient Age:    79 years      BP:           131/63 mmHg Patient Gender: F             HR:           104 bpm. Exam Location:  [facility] Procedure: 2D Echo, Cardiac Doppler, Color Doppler and Intracardiac            Opacification Agent Indications:     R06.00 Dyspnea  History:         Patient has no prior history of Echocardiogram examinations.                  Risk Factors:Hypertension and Dyslipidemia.  Sonographer:     Charmayne Sheer RDCS (AE) Referring Phys:  [id number] Loletha Grayer Diagnosing Phys: Yolonda Kida MD  Sonographer Comments: Technically difficult study due to poor echo windows. Image acquisition challenging due to patient body habitus. IMPRESSIONS  1. Left ventricular ejection fraction, by estimation, is 60 to 65%. The left ventricle has normal function. The left ventricle demonstrates regional wall motion abnormalities (see scoring diagram/findings for description). Left ventricular diastolic parameters are consistent with Grade I diastolic dysfunction (impaired relaxation).  2. Right ventricular systolic function is normal. The right ventricular size is normal.  3. The mitral valve is normal in structure. No evidence of mitral valve regurgitation.  4. The aortic valve is normal in structure. Aortic valve regurgitation is not visualized. FINDINGS  Left Ventricle:  Left ventricular ejection fraction, by estimation, is 60 to 65%. The left ventricle has normal function. The left ventricle demonstrates regional wall motion abnormalities. Definity contrast agent was given IV to delineate the left ventricular endocardial borders. The left ventricular internal cavity size was normal in size. There is borderline left ventricular hypertrophy. Left ventricular diastolic parameters are consistent with Grade I diastolic dysfunction (impaired relaxation). Right Ventricle: The right ventricular size is normal. No increase in right ventricular wall thickness. Right ventricular systolic function is normal. Left Atrium: Left atrial size was normal in size. Right Atrium: Right atrial size was normal in size. Pericardium: There is no evidence of pericardial effusion. Mitral Valve: The mitral  valve is normal in structure. No evidence of mitral valve regurgitation. MV peak gradient, 10.0 mmHg. The mean mitral valve gradient is 5.0 mmHg. Tricuspid Valve: The tricuspid valve is normal in structure. Tricuspid valve regurgitation is not demonstrated. Aortic Valve: The aortic valve is normal in structure. Aortic valve regurgitation is not visualized. Aortic valve mean gradient measures 4.0 mmHg. Aortic valve peak gradient measures 7.5 mmHg. Aortic valve area, by VTI measures 2.72 cm. Pulmonic Valve: The pulmonic valve was normal in structure. Pulmonic valve regurgitation is not visualized. Aorta: The aortic root is normal in size and structure. IAS/Shunts: No atrial level shunt detected by color flow Doppler.  LEFT VENTRICLE PLAX 2D LVIDd:         3.79 cm  Diastology LVIDs:         3.02 cm  LV e' lateral:   9.25 cm/s LV PW:         1.01 cm  LV E/e' lateral: 10.6 LV IVS:        0.80 cm  LV e' medial:    6.85 cm/s LVOT diam:     2.20 cm  LV E/e' medial:  14.3 LV SV:         67 LV SV Index:   33 LVOT Area:     3.80 cm  LEFT ATRIUM             Index LA diam:        2.70 cm 1.34 cm/m LA Vol (A2C):    45.5 ml 22.65 ml/m LA Vol (A4C):   57.9 ml 28.82 ml/m LA Biplane Vol: 51.7 ml 25.74 ml/m  AORTIC VALVE                   PULMONIC VALVE AV Area (Vmax):    2.61 cm    PV Vmax:       1.28 m/s AV Area (Vmean):   2.71 cm    PV Vmean:      85.900 cm/s AV Area (VTI):     2.72 cm    PV VTI:        0.241 m AV Vmax:           137.00 cm/s PV Peak grad:  6.6 mmHg AV Vmean:          88.700 cm/s PV Mean grad:  3.0 mmHg AV VTI:            0.245 m AV Peak Grad:      7.5 mmHg AV Mean Grad:      4.0 mmHg LVOT Vmax:         94.20 cm/s LVOT Vmean:        63.200 cm/s LVOT VTI:          0.175 m LVOT/AV VTI ratio: 0.71  AORTA Ao Root diam: 3.00 cm MITRAL VALVE MV Area (PHT): 6.32 cm     SHUNTS MV Peak grad:  10.0 mmHg    Systemic VTI:  0.18 m MV Mean grad:  5.0 mmHg     Systemic Diam: 2.20 cm MV Vmax:       1.58 m/s MV Vmean:      107.0 cm/s MV Decel Time: 120 msec MV E velocity: 97.90 cm/s MV A velocity: 144.00 cm/s MV E/A ratio:  0.68 Dwayne D Callwood MD Electronically signed by Yolonda Kida MD Signature Date/Time: 07/13/2020/1:28:49 PM    Final         Scheduled Meds: . enoxaparin (LOVENOX) injection  40  mg Subcutaneous Q24H  . fluticasone furoate-vilanterol  1 puff Inhalation Daily   And  . umeclidinium bromide  1 puff Inhalation Daily  . insulin aspart  0-5 Units Subcutaneous QHS  . insulin aspart  0-6 Units Subcutaneous TID WC  . ipratropium  0.5 mg Nebulization TID  . irbesartan  150 mg Oral Daily  . levothyroxine  75 mcg Oral Q0600  . nystatin cream  1 application Topical BID  . pantoprazole  40 mg Oral BID  . simvastatin  20 mg Oral QHS   Continuous Infusions:    LOS: 1 day    Time spent: 30 mins     Wyvonnia Dusky, MD [facility] Pager [phone]  If 7PM-7AM, please contact night-coverage [email or web address] 07/14/2020, 3:07 PM

## 2020-07-14 NOTE — TOC Progression Note (Signed)
Transition of Care [facility]) - Progression Note    Patient Details  Name: Erika Nelson MRN: [id number] Date of Birth: 02-Apr-1941  [facility]) CM/SW Contact  Abdullah Rizzi, Gardiner Rhyme, LCSW Phone Number: 07/14/2020, 9:41 AM  Clinical Narrative:  Pt is active with [facility] with PT,OT and RN will need to have new orders once ready for discharge home. Pt is feeling better today but unsure of when going home         Expected Discharge Plan and Services                                                 Social Determinants of Health (SDOH) Interventions    Readmission Risk Interventions No flowsheet data found.
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
## 2020-07-14 NOTE — Progress Notes (Signed)
Occupational Therapy Treatment Patient Details Name: Erika Nelson MRN: [id number] DOB: 08/21/1941 Today's Date: 07/14/2020    History of present illness Erika Nelson is a 79 y.o. female with PMH of anxiety, HLD, HTN, diverticulosis, and cervicalgia who presents to the ED with c/o of increasing SOB and weakness.   OT comments  Pt seen for OT tx this date to f/u re: safety with self care ADLs and EC strategies. Teach back method implemented to ascertain pt understanding of EC strategies. Pt with good understanding. Able to indentify 4 EC strategies r/t ADLs. Anticipate that pt would continue to benefit from OT f/u in home setting with HHOT to implement efficiency and EC strategies in the home as it pertains to safe ADL performance.    Follow Up Recommendations  Home health OT    Equipment Recommendations  3 in 1 bedside commode    Recommendations for Other Services      Precautions / Restrictions Precautions Precautions: Fall Restrictions Weight Bearing Restrictions: No       Mobility Bed Mobility Overal bed mobility: Independent             General bed mobility comments: Deferred. Pt seated at EOB upon OT arrival.  Transfers                      Balance Overall balance assessment: Needs assistance Sitting-balance support: Feet supported;No upper extremity supported Sitting balance-Leahy Scale: Good                                     ADL either performed or assessed with clinical judgement   ADL                                               Vision Baseline Vision/History: Wears glasses Wears Glasses: At all times Patient Visual Report: No change from baseline     Perception     Praxis      Cognition Arousal/Alertness: Awake/alert Behavior During Therapy: WFL for tasks assessed/performed Overall Cognitive Status: Within Functional Limits for tasks assessed                                           Exercises Other Exercises Other Exercises: Teach back method implemented to ascertain pt understanding of EC strategies. Pt with good understanding. Able to indentify 4 EC strategies r/t ADLs.   Shoulder Instructions       General Comments      Pertinent Vitals/ Pain       Pain Assessment: No/denies pain  Home Living                                          Prior Functioning/Environment              Frequency  Min 2X/week        Progress Toward Goals  OT Goals(current goals can now be found in the care plan section)  Progress towards OT goals: Progressing toward goals  Acute Rehab OT Goals Patient Stated Goal: To get stronger and  be more independent. OT Goal Formulation: With patient Time For Goal Achievement: 07/27/20 Potential to Achieve Goals: Good  Plan Discharge plan remains appropriate    Co-evaluation                 AM-PAC OT "6 Clicks" Daily Activity     Outcome Measure   Help from another person eating meals?: None Help from another person taking care of personal grooming?: A Little Help from another person toileting, which includes using toliet, bedpan, or urinal?: A Little Help from another person bathing (including washing, rinsing, drying)?: A Little Help from another person to put on and taking off regular upper body clothing?: A Little Help from another person to put on and taking off regular lower body clothing?: A Little 6 Click Score: 19    End of Session    OT Visit Diagnosis: Other abnormalities of gait and mobility (R26.89);Muscle weakness (generalized) (M62.81)   Activity Tolerance Patient tolerated treatment well   Patient Left in chair;with call bell/phone within reach;with chair alarm set;with nursing/sitter in room   Nurse Communication          Time: 1324-4010 OT Time Calculation (min): 15 min  Charges: OT General Charges $OT Visit: 1 Visit OT Treatments $Self Care/Home Management :  8-22 mins   Gerrianne Scale, [place], OTR/L ascom [phone] 07/14/20, 5:06 PM

## 2020-07-20 DIAGNOSIS — N183 Chronic kidney disease, stage 3 unspecified: Secondary | ICD-10-CM | POA: Diagnosis not present

## 2020-07-20 DIAGNOSIS — I2781 Cor pulmonale (chronic): Secondary | ICD-10-CM | POA: Diagnosis not present

## 2020-07-20 DIAGNOSIS — I5030 Unspecified diastolic (congestive) heart failure: Secondary | ICD-10-CM | POA: Diagnosis not present

## 2020-07-20 DIAGNOSIS — J449 Chronic obstructive pulmonary disease, unspecified: Secondary | ICD-10-CM | POA: Diagnosis not present

## 2020-07-20 DIAGNOSIS — E1122 Type 2 diabetes mellitus with diabetic chronic kidney disease: Secondary | ICD-10-CM | POA: Diagnosis not present

## 2020-07-20 DIAGNOSIS — J9621 Acute and chronic respiratory failure with hypoxia: Secondary | ICD-10-CM | POA: Diagnosis not present

## 2020-07-21 DIAGNOSIS — M5136 Other intervertebral disc degeneration, lumbar region: Secondary | ICD-10-CM | POA: Diagnosis not present

## 2020-07-21 DIAGNOSIS — I129 Hypertensive chronic kidney disease with stage 1 through stage 4 chronic kidney disease, or unspecified chronic kidney disease: Secondary | ICD-10-CM | POA: Diagnosis not present

## 2020-07-21 DIAGNOSIS — N189 Chronic kidney disease, unspecified: Secondary | ICD-10-CM | POA: Diagnosis not present

## 2020-07-21 DIAGNOSIS — M150 Primary generalized (osteo)arthritis: Secondary | ICD-10-CM | POA: Diagnosis not present

## 2020-07-21 DIAGNOSIS — M792 Neuralgia and neuritis, unspecified: Secondary | ICD-10-CM | POA: Diagnosis not present

## 2020-07-21 DIAGNOSIS — M48062 Spinal stenosis, lumbar region with neurogenic claudication: Secondary | ICD-10-CM | POA: Diagnosis not present

## 2020-07-21 DIAGNOSIS — E1122 Type 2 diabetes mellitus with diabetic chronic kidney disease: Secondary | ICD-10-CM | POA: Diagnosis not present

## 2020-07-21 DIAGNOSIS — G8929 Other chronic pain: Secondary | ICD-10-CM | POA: Diagnosis not present

## 2020-07-21 DIAGNOSIS — M5410 Radiculopathy, site unspecified: Secondary | ICD-10-CM | POA: Diagnosis not present

## 2020-07-22 DIAGNOSIS — E1122 Type 2 diabetes mellitus with diabetic chronic kidney disease: Secondary | ICD-10-CM | POA: Diagnosis not present

## 2020-07-22 DIAGNOSIS — N189 Chronic kidney disease, unspecified: Secondary | ICD-10-CM | POA: Diagnosis not present

## 2020-07-22 DIAGNOSIS — M792 Neuralgia and neuritis, unspecified: Secondary | ICD-10-CM | POA: Diagnosis not present

## 2020-07-22 DIAGNOSIS — M5136 Other intervertebral disc degeneration, lumbar region: Secondary | ICD-10-CM | POA: Diagnosis not present

## 2020-07-22 DIAGNOSIS — M48062 Spinal stenosis, lumbar region with neurogenic claudication: Secondary | ICD-10-CM | POA: Diagnosis not present

## 2020-07-22 DIAGNOSIS — I129 Hypertensive chronic kidney disease with stage 1 through stage 4 chronic kidney disease, or unspecified chronic kidney disease: Secondary | ICD-10-CM | POA: Diagnosis not present

## 2020-07-22 DIAGNOSIS — G8929 Other chronic pain: Secondary | ICD-10-CM | POA: Diagnosis not present

## 2020-07-22 DIAGNOSIS — M150 Primary generalized (osteo)arthritis: Secondary | ICD-10-CM | POA: Diagnosis not present

## 2020-07-22 DIAGNOSIS — M5410 Radiculopathy, site unspecified: Secondary | ICD-10-CM | POA: Diagnosis not present

## 2020-07-27 ENCOUNTER — Other Ambulatory Visit: Payer: Self-pay

## 2020-07-27 ENCOUNTER — Ambulatory Visit
Admission: RE | Admit: 2020-07-27 | Discharge: 2020-07-27 | Disposition: A | Discharge status: Home / Self Care | Payer: Medicare HMO | Source: Ambulatory Visit | Attending: Physical Medicine and Rehabilitation | Admitting: Physical Medicine and Rehabilitation

## 2020-07-27 DIAGNOSIS — M545 Low back pain: Secondary | ICD-10-CM | POA: Diagnosis not present

## 2020-07-27 DIAGNOSIS — G8929 Other chronic pain: Secondary | ICD-10-CM | POA: Insufficient documentation

## 2020-07-29 DIAGNOSIS — M47816 Spondylosis without myelopathy or radiculopathy, lumbar region: Secondary | ICD-10-CM | POA: Diagnosis not present

## 2020-07-29 DIAGNOSIS — M5136 Other intervertebral disc degeneration, lumbar region: Secondary | ICD-10-CM | POA: Diagnosis not present

## 2020-08-03 DIAGNOSIS — N183 Chronic kidney disease, stage 3 unspecified: Secondary | ICD-10-CM | POA: Diagnosis not present

## 2020-08-03 DIAGNOSIS — G8929 Other chronic pain: Secondary | ICD-10-CM | POA: Diagnosis not present

## 2020-08-03 DIAGNOSIS — M48062 Spinal stenosis, lumbar region with neurogenic claudication: Secondary | ICD-10-CM | POA: Diagnosis not present

## 2020-08-03 DIAGNOSIS — M5136 Other intervertebral disc degeneration, lumbar region: Secondary | ICD-10-CM | POA: Diagnosis not present

## 2020-08-03 DIAGNOSIS — M150 Primary generalized (osteo)arthritis: Secondary | ICD-10-CM | POA: Diagnosis not present

## 2020-08-03 DIAGNOSIS — M792 Neuralgia and neuritis, unspecified: Secondary | ICD-10-CM | POA: Diagnosis not present

## 2020-08-03 DIAGNOSIS — N189 Chronic kidney disease, unspecified: Secondary | ICD-10-CM | POA: Diagnosis not present

## 2020-08-03 DIAGNOSIS — I2781 Cor pulmonale (chronic): Secondary | ICD-10-CM | POA: Diagnosis not present

## 2020-08-03 DIAGNOSIS — E1122 Type 2 diabetes mellitus with diabetic chronic kidney disease: Secondary | ICD-10-CM | POA: Diagnosis not present

## 2020-08-03 DIAGNOSIS — I129 Hypertensive chronic kidney disease with stage 1 through stage 4 chronic kidney disease, or unspecified chronic kidney disease: Secondary | ICD-10-CM | POA: Diagnosis not present

## 2020-08-03 DIAGNOSIS — M5410 Radiculopathy, site unspecified: Secondary | ICD-10-CM | POA: Diagnosis not present

## 2020-08-04 DIAGNOSIS — G8929 Other chronic pain: Secondary | ICD-10-CM | POA: Diagnosis not present

## 2020-08-04 DIAGNOSIS — E1122 Type 2 diabetes mellitus with diabetic chronic kidney disease: Secondary | ICD-10-CM | POA: Diagnosis not present

## 2020-08-04 DIAGNOSIS — M5136 Other intervertebral disc degeneration, lumbar region: Secondary | ICD-10-CM | POA: Diagnosis not present

## 2020-08-04 DIAGNOSIS — N189 Chronic kidney disease, unspecified: Secondary | ICD-10-CM | POA: Diagnosis not present

## 2020-08-04 DIAGNOSIS — M5410 Radiculopathy, site unspecified: Secondary | ICD-10-CM | POA: Diagnosis not present

## 2020-08-04 DIAGNOSIS — I129 Hypertensive chronic kidney disease with stage 1 through stage 4 chronic kidney disease, or unspecified chronic kidney disease: Secondary | ICD-10-CM | POA: Diagnosis not present

## 2020-08-04 DIAGNOSIS — M150 Primary generalized (osteo)arthritis: Secondary | ICD-10-CM | POA: Diagnosis not present

## 2020-08-04 DIAGNOSIS — M792 Neuralgia and neuritis, unspecified: Secondary | ICD-10-CM | POA: Diagnosis not present

## 2020-08-04 DIAGNOSIS — M48062 Spinal stenosis, lumbar region with neurogenic claudication: Secondary | ICD-10-CM | POA: Diagnosis not present

## 2020-08-07 DIAGNOSIS — I129 Hypertensive chronic kidney disease with stage 1 through stage 4 chronic kidney disease, or unspecified chronic kidney disease: Secondary | ICD-10-CM | POA: Diagnosis not present

## 2020-08-07 DIAGNOSIS — M150 Primary generalized (osteo)arthritis: Secondary | ICD-10-CM | POA: Diagnosis not present

## 2020-08-07 DIAGNOSIS — N189 Chronic kidney disease, unspecified: Secondary | ICD-10-CM | POA: Diagnosis not present

## 2020-08-07 DIAGNOSIS — M5136 Other intervertebral disc degeneration, lumbar region: Secondary | ICD-10-CM | POA: Diagnosis not present

## 2020-08-07 DIAGNOSIS — M792 Neuralgia and neuritis, unspecified: Secondary | ICD-10-CM | POA: Diagnosis not present

## 2020-08-07 DIAGNOSIS — M5410 Radiculopathy, site unspecified: Secondary | ICD-10-CM | POA: Diagnosis not present

## 2020-08-07 DIAGNOSIS — M48062 Spinal stenosis, lumbar region with neurogenic claudication: Secondary | ICD-10-CM | POA: Diagnosis not present

## 2020-08-07 DIAGNOSIS — G8929 Other chronic pain: Secondary | ICD-10-CM | POA: Diagnosis not present

## 2020-08-07 DIAGNOSIS — E1122 Type 2 diabetes mellitus with diabetic chronic kidney disease: Secondary | ICD-10-CM | POA: Diagnosis not present

## 2020-08-10 DIAGNOSIS — I129 Hypertensive chronic kidney disease with stage 1 through stage 4 chronic kidney disease, or unspecified chronic kidney disease: Secondary | ICD-10-CM | POA: Diagnosis not present

## 2020-08-10 DIAGNOSIS — N189 Chronic kidney disease, unspecified: Secondary | ICD-10-CM | POA: Diagnosis not present

## 2020-08-10 DIAGNOSIS — M5136 Other intervertebral disc degeneration, lumbar region: Secondary | ICD-10-CM | POA: Diagnosis not present

## 2020-08-10 DIAGNOSIS — E1122 Type 2 diabetes mellitus with diabetic chronic kidney disease: Secondary | ICD-10-CM | POA: Diagnosis not present

## 2020-08-10 DIAGNOSIS — G8929 Other chronic pain: Secondary | ICD-10-CM | POA: Diagnosis not present

## 2020-08-10 DIAGNOSIS — M150 Primary generalized (osteo)arthritis: Secondary | ICD-10-CM | POA: Diagnosis not present

## 2020-08-10 DIAGNOSIS — M5410 Radiculopathy, site unspecified: Secondary | ICD-10-CM | POA: Diagnosis not present

## 2020-08-10 DIAGNOSIS — M792 Neuralgia and neuritis, unspecified: Secondary | ICD-10-CM | POA: Diagnosis not present

## 2020-08-10 DIAGNOSIS — M48062 Spinal stenosis, lumbar region with neurogenic claudication: Secondary | ICD-10-CM | POA: Diagnosis not present

## 2020-08-17 DIAGNOSIS — I129 Hypertensive chronic kidney disease with stage 1 through stage 4 chronic kidney disease, or unspecified chronic kidney disease: Secondary | ICD-10-CM | POA: Diagnosis not present

## 2020-08-17 DIAGNOSIS — M5410 Radiculopathy, site unspecified: Secondary | ICD-10-CM | POA: Diagnosis not present

## 2020-08-17 DIAGNOSIS — M5136 Other intervertebral disc degeneration, lumbar region: Secondary | ICD-10-CM | POA: Diagnosis not present

## 2020-08-17 DIAGNOSIS — N189 Chronic kidney disease, unspecified: Secondary | ICD-10-CM | POA: Diagnosis not present

## 2020-08-17 DIAGNOSIS — M48062 Spinal stenosis, lumbar region with neurogenic claudication: Secondary | ICD-10-CM | POA: Diagnosis not present

## 2020-08-17 DIAGNOSIS — E1122 Type 2 diabetes mellitus with diabetic chronic kidney disease: Secondary | ICD-10-CM | POA: Diagnosis not present

## 2020-08-17 DIAGNOSIS — G8929 Other chronic pain: Secondary | ICD-10-CM | POA: Diagnosis not present

## 2020-08-17 DIAGNOSIS — M792 Neuralgia and neuritis, unspecified: Secondary | ICD-10-CM | POA: Diagnosis not present

## 2020-08-17 DIAGNOSIS — M150 Primary generalized (osteo)arthritis: Secondary | ICD-10-CM | POA: Diagnosis not present

## 2020-08-18 DIAGNOSIS — M150 Primary generalized (osteo)arthritis: Secondary | ICD-10-CM | POA: Diagnosis not present

## 2020-08-18 DIAGNOSIS — I129 Hypertensive chronic kidney disease with stage 1 through stage 4 chronic kidney disease, or unspecified chronic kidney disease: Secondary | ICD-10-CM | POA: Diagnosis not present

## 2020-08-18 DIAGNOSIS — G8929 Other chronic pain: Secondary | ICD-10-CM | POA: Diagnosis not present

## 2020-08-18 DIAGNOSIS — M5136 Other intervertebral disc degeneration, lumbar region: Secondary | ICD-10-CM | POA: Diagnosis not present

## 2020-08-18 DIAGNOSIS — E1122 Type 2 diabetes mellitus with diabetic chronic kidney disease: Secondary | ICD-10-CM | POA: Diagnosis not present

## 2020-08-18 DIAGNOSIS — M792 Neuralgia and neuritis, unspecified: Secondary | ICD-10-CM | POA: Diagnosis not present

## 2020-08-18 DIAGNOSIS — M5410 Radiculopathy, site unspecified: Secondary | ICD-10-CM | POA: Diagnosis not present

## 2020-08-18 DIAGNOSIS — N189 Chronic kidney disease, unspecified: Secondary | ICD-10-CM | POA: Diagnosis not present

## 2020-08-18 DIAGNOSIS — M48062 Spinal stenosis, lumbar region with neurogenic claudication: Secondary | ICD-10-CM | POA: Diagnosis not present

## 2020-08-23 DIAGNOSIS — E860 Dehydration: Secondary | ICD-10-CM | POA: Diagnosis not present

## 2020-08-23 DIAGNOSIS — E1169 Type 2 diabetes mellitus with other specified complication: Secondary | ICD-10-CM | POA: Diagnosis not present

## 2020-08-23 DIAGNOSIS — E782 Mixed hyperlipidemia: Secondary | ICD-10-CM | POA: Diagnosis not present

## 2020-08-23 DIAGNOSIS — M150 Primary generalized (osteo)arthritis: Secondary | ICD-10-CM | POA: Diagnosis not present

## 2020-08-23 DIAGNOSIS — J4540 Moderate persistent asthma, uncomplicated: Secondary | ICD-10-CM | POA: Diagnosis not present

## 2020-08-23 DIAGNOSIS — E1122 Type 2 diabetes mellitus with diabetic chronic kidney disease: Secondary | ICD-10-CM | POA: Diagnosis not present

## 2020-08-23 DIAGNOSIS — M792 Neuralgia and neuritis, unspecified: Secondary | ICD-10-CM | POA: Diagnosis not present

## 2020-08-23 DIAGNOSIS — G8929 Other chronic pain: Secondary | ICD-10-CM | POA: Diagnosis not present

## 2020-08-23 DIAGNOSIS — M5410 Radiculopathy, site unspecified: Secondary | ICD-10-CM | POA: Diagnosis not present

## 2020-08-23 DIAGNOSIS — I129 Hypertensive chronic kidney disease with stage 1 through stage 4 chronic kidney disease, or unspecified chronic kidney disease: Secondary | ICD-10-CM | POA: Diagnosis not present

## 2020-08-23 DIAGNOSIS — N189 Chronic kidney disease, unspecified: Secondary | ICD-10-CM | POA: Diagnosis not present

## 2020-08-23 DIAGNOSIS — M48062 Spinal stenosis, lumbar region with neurogenic claudication: Secondary | ICD-10-CM | POA: Diagnosis not present

## 2020-08-23 DIAGNOSIS — M5136 Other intervertebral disc degeneration, lumbar region: Secondary | ICD-10-CM | POA: Diagnosis not present

## 2020-08-30 DIAGNOSIS — M5136 Other intervertebral disc degeneration, lumbar region: Secondary | ICD-10-CM | POA: Diagnosis not present

## 2020-08-30 DIAGNOSIS — M5410 Radiculopathy, site unspecified: Secondary | ICD-10-CM | POA: Diagnosis not present

## 2020-08-30 DIAGNOSIS — N189 Chronic kidney disease, unspecified: Secondary | ICD-10-CM | POA: Diagnosis not present

## 2020-08-30 DIAGNOSIS — E1122 Type 2 diabetes mellitus with diabetic chronic kidney disease: Secondary | ICD-10-CM | POA: Diagnosis not present

## 2020-08-30 DIAGNOSIS — G8929 Other chronic pain: Secondary | ICD-10-CM | POA: Diagnosis not present

## 2020-08-30 DIAGNOSIS — M792 Neuralgia and neuritis, unspecified: Secondary | ICD-10-CM | POA: Diagnosis not present

## 2020-08-30 DIAGNOSIS — M48062 Spinal stenosis, lumbar region with neurogenic claudication: Secondary | ICD-10-CM | POA: Diagnosis not present

## 2020-08-30 DIAGNOSIS — M150 Primary generalized (osteo)arthritis: Secondary | ICD-10-CM | POA: Diagnosis not present

## 2020-08-30 DIAGNOSIS — I129 Hypertensive chronic kidney disease with stage 1 through stage 4 chronic kidney disease, or unspecified chronic kidney disease: Secondary | ICD-10-CM | POA: Diagnosis not present

## 2020-08-31 DIAGNOSIS — M47816 Spondylosis without myelopathy or radiculopathy, lumbar region: Secondary | ICD-10-CM | POA: Diagnosis not present

## 2020-09-06 DIAGNOSIS — E1122 Type 2 diabetes mellitus with diabetic chronic kidney disease: Secondary | ICD-10-CM | POA: Diagnosis not present

## 2020-09-06 DIAGNOSIS — N189 Chronic kidney disease, unspecified: Secondary | ICD-10-CM | POA: Diagnosis not present

## 2020-09-06 DIAGNOSIS — M5410 Radiculopathy, site unspecified: Secondary | ICD-10-CM | POA: Diagnosis not present

## 2020-09-06 DIAGNOSIS — M150 Primary generalized (osteo)arthritis: Secondary | ICD-10-CM | POA: Diagnosis not present

## 2020-09-06 DIAGNOSIS — M48062 Spinal stenosis, lumbar region with neurogenic claudication: Secondary | ICD-10-CM | POA: Diagnosis not present

## 2020-09-06 DIAGNOSIS — M792 Neuralgia and neuritis, unspecified: Secondary | ICD-10-CM | POA: Diagnosis not present

## 2020-09-06 DIAGNOSIS — I129 Hypertensive chronic kidney disease with stage 1 through stage 4 chronic kidney disease, or unspecified chronic kidney disease: Secondary | ICD-10-CM | POA: Diagnosis not present

## 2020-09-06 DIAGNOSIS — G8929 Other chronic pain: Secondary | ICD-10-CM | POA: Diagnosis not present

## 2020-09-06 DIAGNOSIS — M5136 Other intervertebral disc degeneration, lumbar region: Secondary | ICD-10-CM | POA: Diagnosis not present

## 2020-09-15 DIAGNOSIS — M792 Neuralgia and neuritis, unspecified: Secondary | ICD-10-CM | POA: Diagnosis not present

## 2020-09-15 DIAGNOSIS — N189 Chronic kidney disease, unspecified: Secondary | ICD-10-CM | POA: Diagnosis not present

## 2020-09-15 DIAGNOSIS — I129 Hypertensive chronic kidney disease with stage 1 through stage 4 chronic kidney disease, or unspecified chronic kidney disease: Secondary | ICD-10-CM | POA: Diagnosis not present

## 2020-09-15 DIAGNOSIS — M150 Primary generalized (osteo)arthritis: Secondary | ICD-10-CM | POA: Diagnosis not present

## 2020-09-15 DIAGNOSIS — E1122 Type 2 diabetes mellitus with diabetic chronic kidney disease: Secondary | ICD-10-CM | POA: Diagnosis not present

## 2020-09-15 DIAGNOSIS — M5410 Radiculopathy, site unspecified: Secondary | ICD-10-CM | POA: Diagnosis not present

## 2020-09-15 DIAGNOSIS — G8929 Other chronic pain: Secondary | ICD-10-CM | POA: Diagnosis not present

## 2020-09-15 DIAGNOSIS — M5136 Other intervertebral disc degeneration, lumbar region: Secondary | ICD-10-CM | POA: Diagnosis not present

## 2020-09-15 DIAGNOSIS — M48062 Spinal stenosis, lumbar region with neurogenic claudication: Secondary | ICD-10-CM | POA: Diagnosis not present

## 2020-09-22 DIAGNOSIS — M47816 Spondylosis without myelopathy or radiculopathy, lumbar region: Secondary | ICD-10-CM | POA: Diagnosis not present

## 2020-09-27 DIAGNOSIS — M5136 Other intervertebral disc degeneration, lumbar region: Secondary | ICD-10-CM | POA: Diagnosis not present

## 2020-09-27 DIAGNOSIS — M48062 Spinal stenosis, lumbar region with neurogenic claudication: Secondary | ICD-10-CM | POA: Diagnosis not present

## 2020-09-27 DIAGNOSIS — E1122 Type 2 diabetes mellitus with diabetic chronic kidney disease: Secondary | ICD-10-CM | POA: Diagnosis not present

## 2020-09-27 DIAGNOSIS — G8929 Other chronic pain: Secondary | ICD-10-CM | POA: Diagnosis not present

## 2020-09-27 DIAGNOSIS — M150 Primary generalized (osteo)arthritis: Secondary | ICD-10-CM | POA: Diagnosis not present

## 2020-09-27 DIAGNOSIS — M792 Neuralgia and neuritis, unspecified: Secondary | ICD-10-CM | POA: Diagnosis not present

## 2020-09-27 DIAGNOSIS — M5410 Radiculopathy, site unspecified: Secondary | ICD-10-CM | POA: Diagnosis not present

## 2020-09-27 DIAGNOSIS — I129 Hypertensive chronic kidney disease with stage 1 through stage 4 chronic kidney disease, or unspecified chronic kidney disease: Secondary | ICD-10-CM | POA: Diagnosis not present

## 2020-09-27 DIAGNOSIS — N189 Chronic kidney disease, unspecified: Secondary | ICD-10-CM | POA: Diagnosis not present

## 2020-10-03 DIAGNOSIS — G8929 Other chronic pain: Secondary | ICD-10-CM | POA: Diagnosis not present

## 2020-10-03 DIAGNOSIS — M5410 Radiculopathy, site unspecified: Secondary | ICD-10-CM | POA: Diagnosis not present

## 2020-10-03 DIAGNOSIS — M792 Neuralgia and neuritis, unspecified: Secondary | ICD-10-CM | POA: Diagnosis not present

## 2020-10-03 DIAGNOSIS — E1122 Type 2 diabetes mellitus with diabetic chronic kidney disease: Secondary | ICD-10-CM | POA: Diagnosis not present

## 2020-10-03 DIAGNOSIS — M5136 Other intervertebral disc degeneration, lumbar region: Secondary | ICD-10-CM | POA: Diagnosis not present

## 2020-10-03 DIAGNOSIS — M48062 Spinal stenosis, lumbar region with neurogenic claudication: Secondary | ICD-10-CM | POA: Diagnosis not present

## 2020-10-03 DIAGNOSIS — I129 Hypertensive chronic kidney disease with stage 1 through stage 4 chronic kidney disease, or unspecified chronic kidney disease: Secondary | ICD-10-CM | POA: Diagnosis not present

## 2020-10-03 DIAGNOSIS — M150 Primary generalized (osteo)arthritis: Secondary | ICD-10-CM | POA: Diagnosis not present

## 2020-10-03 DIAGNOSIS — N189 Chronic kidney disease, unspecified: Secondary | ICD-10-CM | POA: Diagnosis not present

## 2020-10-06 DIAGNOSIS — N189 Chronic kidney disease, unspecified: Secondary | ICD-10-CM | POA: Diagnosis not present

## 2020-10-06 DIAGNOSIS — M5136 Other intervertebral disc degeneration, lumbar region: Secondary | ICD-10-CM | POA: Diagnosis not present

## 2020-10-06 DIAGNOSIS — E1122 Type 2 diabetes mellitus with diabetic chronic kidney disease: Secondary | ICD-10-CM | POA: Diagnosis not present

## 2020-10-06 DIAGNOSIS — M792 Neuralgia and neuritis, unspecified: Secondary | ICD-10-CM | POA: Diagnosis not present

## 2020-10-06 DIAGNOSIS — I130 Hypertensive heart and chronic kidney disease with heart failure and stage 1 through stage 4 chronic kidney disease, or unspecified chronic kidney disease: Secondary | ICD-10-CM | POA: Diagnosis not present

## 2020-10-06 DIAGNOSIS — I5020 Unspecified systolic (congestive) heart failure: Secondary | ICD-10-CM | POA: Diagnosis not present

## 2020-10-06 DIAGNOSIS — M150 Primary generalized (osteo)arthritis: Secondary | ICD-10-CM | POA: Diagnosis not present

## 2020-10-06 DIAGNOSIS — I5031 Acute diastolic (congestive) heart failure: Secondary | ICD-10-CM | POA: Diagnosis not present

## 2020-10-06 DIAGNOSIS — M48062 Spinal stenosis, lumbar region with neurogenic claudication: Secondary | ICD-10-CM | POA: Diagnosis not present

## 2020-10-10 DIAGNOSIS — I130 Hypertensive heart and chronic kidney disease with heart failure and stage 1 through stage 4 chronic kidney disease, or unspecified chronic kidney disease: Secondary | ICD-10-CM | POA: Diagnosis not present

## 2020-10-10 DIAGNOSIS — N189 Chronic kidney disease, unspecified: Secondary | ICD-10-CM | POA: Diagnosis not present

## 2020-10-10 DIAGNOSIS — M48062 Spinal stenosis, lumbar region with neurogenic claudication: Secondary | ICD-10-CM | POA: Diagnosis not present

## 2020-10-10 DIAGNOSIS — M5136 Other intervertebral disc degeneration, lumbar region: Secondary | ICD-10-CM | POA: Diagnosis not present

## 2020-10-10 DIAGNOSIS — I5020 Unspecified systolic (congestive) heart failure: Secondary | ICD-10-CM | POA: Diagnosis not present

## 2020-10-10 DIAGNOSIS — M150 Primary generalized (osteo)arthritis: Secondary | ICD-10-CM | POA: Diagnosis not present

## 2020-10-10 DIAGNOSIS — M792 Neuralgia and neuritis, unspecified: Secondary | ICD-10-CM | POA: Diagnosis not present

## 2020-10-10 DIAGNOSIS — E1122 Type 2 diabetes mellitus with diabetic chronic kidney disease: Secondary | ICD-10-CM | POA: Diagnosis not present

## 2020-10-10 DIAGNOSIS — I5031 Acute diastolic (congestive) heart failure: Secondary | ICD-10-CM | POA: Diagnosis not present

## 2020-10-12 DIAGNOSIS — I130 Hypertensive heart and chronic kidney disease with heart failure and stage 1 through stage 4 chronic kidney disease, or unspecified chronic kidney disease: Secondary | ICD-10-CM | POA: Diagnosis not present

## 2020-10-12 DIAGNOSIS — M48062 Spinal stenosis, lumbar region with neurogenic claudication: Secondary | ICD-10-CM | POA: Diagnosis not present

## 2020-10-12 DIAGNOSIS — I5031 Acute diastolic (congestive) heart failure: Secondary | ICD-10-CM | POA: Diagnosis not present

## 2020-10-12 DIAGNOSIS — E1122 Type 2 diabetes mellitus with diabetic chronic kidney disease: Secondary | ICD-10-CM | POA: Diagnosis not present

## 2020-10-12 DIAGNOSIS — M792 Neuralgia and neuritis, unspecified: Secondary | ICD-10-CM | POA: Diagnosis not present

## 2020-10-12 DIAGNOSIS — M5136 Other intervertebral disc degeneration, lumbar region: Secondary | ICD-10-CM | POA: Diagnosis not present

## 2020-10-12 DIAGNOSIS — N189 Chronic kidney disease, unspecified: Secondary | ICD-10-CM | POA: Diagnosis not present

## 2020-10-12 DIAGNOSIS — M150 Primary generalized (osteo)arthritis: Secondary | ICD-10-CM | POA: Diagnosis not present

## 2020-10-12 DIAGNOSIS — I5020 Unspecified systolic (congestive) heart failure: Secondary | ICD-10-CM | POA: Diagnosis not present

## 2020-10-13 DIAGNOSIS — I5020 Unspecified systolic (congestive) heart failure: Secondary | ICD-10-CM | POA: Diagnosis not present

## 2020-10-13 DIAGNOSIS — M48062 Spinal stenosis, lumbar region with neurogenic claudication: Secondary | ICD-10-CM | POA: Diagnosis not present

## 2020-10-13 DIAGNOSIS — M47816 Spondylosis without myelopathy or radiculopathy, lumbar region: Secondary | ICD-10-CM | POA: Diagnosis not present

## 2020-10-13 DIAGNOSIS — I130 Hypertensive heart and chronic kidney disease with heart failure and stage 1 through stage 4 chronic kidney disease, or unspecified chronic kidney disease: Secondary | ICD-10-CM | POA: Diagnosis not present

## 2020-10-13 DIAGNOSIS — M150 Primary generalized (osteo)arthritis: Secondary | ICD-10-CM | POA: Diagnosis not present

## 2020-10-13 DIAGNOSIS — M5136 Other intervertebral disc degeneration, lumbar region: Secondary | ICD-10-CM | POA: Diagnosis not present

## 2020-10-13 DIAGNOSIS — M792 Neuralgia and neuritis, unspecified: Secondary | ICD-10-CM | POA: Diagnosis not present

## 2020-10-13 DIAGNOSIS — N189 Chronic kidney disease, unspecified: Secondary | ICD-10-CM | POA: Diagnosis not present

## 2020-10-13 DIAGNOSIS — E1122 Type 2 diabetes mellitus with diabetic chronic kidney disease: Secondary | ICD-10-CM | POA: Diagnosis not present

## 2020-10-13 DIAGNOSIS — I5031 Acute diastolic (congestive) heart failure: Secondary | ICD-10-CM | POA: Diagnosis not present

## 2020-10-19 DIAGNOSIS — M48062 Spinal stenosis, lumbar region with neurogenic claudication: Secondary | ICD-10-CM | POA: Diagnosis not present

## 2020-10-19 DIAGNOSIS — M150 Primary generalized (osteo)arthritis: Secondary | ICD-10-CM | POA: Diagnosis not present

## 2020-10-19 DIAGNOSIS — M5136 Other intervertebral disc degeneration, lumbar region: Secondary | ICD-10-CM | POA: Diagnosis not present

## 2020-10-19 DIAGNOSIS — N189 Chronic kidney disease, unspecified: Secondary | ICD-10-CM | POA: Diagnosis not present

## 2020-10-19 DIAGNOSIS — I5020 Unspecified systolic (congestive) heart failure: Secondary | ICD-10-CM | POA: Diagnosis not present

## 2020-10-19 DIAGNOSIS — E1122 Type 2 diabetes mellitus with diabetic chronic kidney disease: Secondary | ICD-10-CM | POA: Diagnosis not present

## 2020-10-19 DIAGNOSIS — M792 Neuralgia and neuritis, unspecified: Secondary | ICD-10-CM | POA: Diagnosis not present

## 2020-10-19 DIAGNOSIS — I130 Hypertensive heart and chronic kidney disease with heart failure and stage 1 through stage 4 chronic kidney disease, or unspecified chronic kidney disease: Secondary | ICD-10-CM | POA: Diagnosis not present

## 2020-10-19 DIAGNOSIS — I5031 Acute diastolic (congestive) heart failure: Secondary | ICD-10-CM | POA: Diagnosis not present

## 2020-10-24 DIAGNOSIS — N189 Chronic kidney disease, unspecified: Secondary | ICD-10-CM | POA: Diagnosis not present

## 2020-10-24 DIAGNOSIS — M792 Neuralgia and neuritis, unspecified: Secondary | ICD-10-CM | POA: Diagnosis not present

## 2020-10-24 DIAGNOSIS — M5136 Other intervertebral disc degeneration, lumbar region: Secondary | ICD-10-CM | POA: Diagnosis not present

## 2020-10-24 DIAGNOSIS — I5020 Unspecified systolic (congestive) heart failure: Secondary | ICD-10-CM | POA: Diagnosis not present

## 2020-10-24 DIAGNOSIS — M150 Primary generalized (osteo)arthritis: Secondary | ICD-10-CM | POA: Diagnosis not present

## 2020-10-24 DIAGNOSIS — M48062 Spinal stenosis, lumbar region with neurogenic claudication: Secondary | ICD-10-CM | POA: Diagnosis not present

## 2020-10-24 DIAGNOSIS — I130 Hypertensive heart and chronic kidney disease with heart failure and stage 1 through stage 4 chronic kidney disease, or unspecified chronic kidney disease: Secondary | ICD-10-CM | POA: Diagnosis not present

## 2020-10-24 DIAGNOSIS — E1122 Type 2 diabetes mellitus with diabetic chronic kidney disease: Secondary | ICD-10-CM | POA: Diagnosis not present

## 2020-10-24 DIAGNOSIS — I5031 Acute diastolic (congestive) heart failure: Secondary | ICD-10-CM | POA: Diagnosis not present

## 2020-11-02 DIAGNOSIS — M5136 Other intervertebral disc degeneration, lumbar region: Secondary | ICD-10-CM | POA: Diagnosis not present

## 2020-11-02 DIAGNOSIS — N189 Chronic kidney disease, unspecified: Secondary | ICD-10-CM | POA: Diagnosis not present

## 2020-11-02 DIAGNOSIS — E1122 Type 2 diabetes mellitus with diabetic chronic kidney disease: Secondary | ICD-10-CM | POA: Diagnosis not present

## 2020-11-02 DIAGNOSIS — M792 Neuralgia and neuritis, unspecified: Secondary | ICD-10-CM | POA: Diagnosis not present

## 2020-11-02 DIAGNOSIS — I5020 Unspecified systolic (congestive) heart failure: Secondary | ICD-10-CM | POA: Diagnosis not present

## 2020-11-02 DIAGNOSIS — I5031 Acute diastolic (congestive) heart failure: Secondary | ICD-10-CM | POA: Diagnosis not present

## 2020-11-02 DIAGNOSIS — I130 Hypertensive heart and chronic kidney disease with heart failure and stage 1 through stage 4 chronic kidney disease, or unspecified chronic kidney disease: Secondary | ICD-10-CM | POA: Diagnosis not present

## 2020-11-02 DIAGNOSIS — M48062 Spinal stenosis, lumbar region with neurogenic claudication: Secondary | ICD-10-CM | POA: Diagnosis not present

## 2020-11-02 DIAGNOSIS — M150 Primary generalized (osteo)arthritis: Secondary | ICD-10-CM | POA: Diagnosis not present

## 2020-11-03 DIAGNOSIS — M150 Primary generalized (osteo)arthritis: Secondary | ICD-10-CM | POA: Diagnosis not present

## 2020-11-03 DIAGNOSIS — I5031 Acute diastolic (congestive) heart failure: Secondary | ICD-10-CM | POA: Diagnosis not present

## 2020-11-03 DIAGNOSIS — E1122 Type 2 diabetes mellitus with diabetic chronic kidney disease: Secondary | ICD-10-CM | POA: Diagnosis not present

## 2020-11-03 DIAGNOSIS — I5020 Unspecified systolic (congestive) heart failure: Secondary | ICD-10-CM | POA: Diagnosis not present

## 2020-11-03 DIAGNOSIS — M5136 Other intervertebral disc degeneration, lumbar region: Secondary | ICD-10-CM | POA: Diagnosis not present

## 2020-11-03 DIAGNOSIS — I130 Hypertensive heart and chronic kidney disease with heart failure and stage 1 through stage 4 chronic kidney disease, or unspecified chronic kidney disease: Secondary | ICD-10-CM | POA: Diagnosis not present

## 2020-11-03 DIAGNOSIS — M48062 Spinal stenosis, lumbar region with neurogenic claudication: Secondary | ICD-10-CM | POA: Diagnosis not present

## 2020-11-03 DIAGNOSIS — N189 Chronic kidney disease, unspecified: Secondary | ICD-10-CM | POA: Diagnosis not present

## 2020-11-03 DIAGNOSIS — M792 Neuralgia and neuritis, unspecified: Secondary | ICD-10-CM | POA: Diagnosis not present

## 2020-11-05 DIAGNOSIS — M792 Neuralgia and neuritis, unspecified: Secondary | ICD-10-CM | POA: Diagnosis not present

## 2020-11-05 DIAGNOSIS — N189 Chronic kidney disease, unspecified: Secondary | ICD-10-CM | POA: Diagnosis not present

## 2020-11-05 DIAGNOSIS — M48062 Spinal stenosis, lumbar region with neurogenic claudication: Secondary | ICD-10-CM | POA: Diagnosis not present

## 2020-11-05 DIAGNOSIS — E1122 Type 2 diabetes mellitus with diabetic chronic kidney disease: Secondary | ICD-10-CM | POA: Diagnosis not present

## 2020-11-05 DIAGNOSIS — I5020 Unspecified systolic (congestive) heart failure: Secondary | ICD-10-CM | POA: Diagnosis not present

## 2020-11-05 DIAGNOSIS — M150 Primary generalized (osteo)arthritis: Secondary | ICD-10-CM | POA: Diagnosis not present

## 2020-11-05 DIAGNOSIS — I5031 Acute diastolic (congestive) heart failure: Secondary | ICD-10-CM | POA: Diagnosis not present

## 2020-11-05 DIAGNOSIS — I130 Hypertensive heart and chronic kidney disease with heart failure and stage 1 through stage 4 chronic kidney disease, or unspecified chronic kidney disease: Secondary | ICD-10-CM | POA: Diagnosis not present

## 2020-11-05 DIAGNOSIS — M5136 Other intervertebral disc degeneration, lumbar region: Secondary | ICD-10-CM | POA: Diagnosis not present

## 2020-11-08 DIAGNOSIS — I5031 Acute diastolic (congestive) heart failure: Secondary | ICD-10-CM | POA: Diagnosis not present

## 2020-11-08 DIAGNOSIS — E1122 Type 2 diabetes mellitus with diabetic chronic kidney disease: Secondary | ICD-10-CM | POA: Diagnosis not present

## 2020-11-08 DIAGNOSIS — M792 Neuralgia and neuritis, unspecified: Secondary | ICD-10-CM | POA: Diagnosis not present

## 2020-11-08 DIAGNOSIS — I5020 Unspecified systolic (congestive) heart failure: Secondary | ICD-10-CM | POA: Diagnosis not present

## 2020-11-08 DIAGNOSIS — M48062 Spinal stenosis, lumbar region with neurogenic claudication: Secondary | ICD-10-CM | POA: Diagnosis not present

## 2020-11-08 DIAGNOSIS — I130 Hypertensive heart and chronic kidney disease with heart failure and stage 1 through stage 4 chronic kidney disease, or unspecified chronic kidney disease: Secondary | ICD-10-CM | POA: Diagnosis not present

## 2020-11-08 DIAGNOSIS — N189 Chronic kidney disease, unspecified: Secondary | ICD-10-CM | POA: Diagnosis not present

## 2020-11-08 DIAGNOSIS — M5136 Other intervertebral disc degeneration, lumbar region: Secondary | ICD-10-CM | POA: Diagnosis not present

## 2020-11-08 DIAGNOSIS — M150 Primary generalized (osteo)arthritis: Secondary | ICD-10-CM | POA: Diagnosis not present

## 2020-11-10 DIAGNOSIS — M47816 Spondylosis without myelopathy or radiculopathy, lumbar region: Secondary | ICD-10-CM | POA: Diagnosis not present

## 2020-11-14 DIAGNOSIS — I130 Hypertensive heart and chronic kidney disease with heart failure and stage 1 through stage 4 chronic kidney disease, or unspecified chronic kidney disease: Secondary | ICD-10-CM | POA: Diagnosis not present

## 2020-11-14 DIAGNOSIS — E1122 Type 2 diabetes mellitus with diabetic chronic kidney disease: Secondary | ICD-10-CM | POA: Diagnosis not present

## 2020-11-14 DIAGNOSIS — I5031 Acute diastolic (congestive) heart failure: Secondary | ICD-10-CM | POA: Diagnosis not present

## 2020-11-14 DIAGNOSIS — I5020 Unspecified systolic (congestive) heart failure: Secondary | ICD-10-CM | POA: Diagnosis not present

## 2020-11-14 DIAGNOSIS — M792 Neuralgia and neuritis, unspecified: Secondary | ICD-10-CM | POA: Diagnosis not present

## 2020-11-14 DIAGNOSIS — M48062 Spinal stenosis, lumbar region with neurogenic claudication: Secondary | ICD-10-CM | POA: Diagnosis not present

## 2020-11-14 DIAGNOSIS — M150 Primary generalized (osteo)arthritis: Secondary | ICD-10-CM | POA: Diagnosis not present

## 2020-11-14 DIAGNOSIS — M5136 Other intervertebral disc degeneration, lumbar region: Secondary | ICD-10-CM | POA: Diagnosis not present

## 2020-11-14 DIAGNOSIS — N189 Chronic kidney disease, unspecified: Secondary | ICD-10-CM | POA: Diagnosis not present

## 2020-11-15 DIAGNOSIS — E782 Mixed hyperlipidemia: Secondary | ICD-10-CM | POA: Diagnosis not present

## 2020-11-15 DIAGNOSIS — E1169 Type 2 diabetes mellitus with other specified complication: Secondary | ICD-10-CM | POA: Diagnosis not present

## 2020-11-21 DIAGNOSIS — I130 Hypertensive heart and chronic kidney disease with heart failure and stage 1 through stage 4 chronic kidney disease, or unspecified chronic kidney disease: Secondary | ICD-10-CM | POA: Diagnosis not present

## 2020-11-21 DIAGNOSIS — I5031 Acute diastolic (congestive) heart failure: Secondary | ICD-10-CM | POA: Diagnosis not present

## 2020-11-21 DIAGNOSIS — E1122 Type 2 diabetes mellitus with diabetic chronic kidney disease: Secondary | ICD-10-CM | POA: Diagnosis not present

## 2020-11-21 DIAGNOSIS — M48062 Spinal stenosis, lumbar region with neurogenic claudication: Secondary | ICD-10-CM | POA: Diagnosis not present

## 2020-11-21 DIAGNOSIS — I5020 Unspecified systolic (congestive) heart failure: Secondary | ICD-10-CM | POA: Diagnosis not present

## 2020-11-21 DIAGNOSIS — M5136 Other intervertebral disc degeneration, lumbar region: Secondary | ICD-10-CM | POA: Diagnosis not present

## 2020-11-21 DIAGNOSIS — M150 Primary generalized (osteo)arthritis: Secondary | ICD-10-CM | POA: Diagnosis not present

## 2020-11-21 DIAGNOSIS — N189 Chronic kidney disease, unspecified: Secondary | ICD-10-CM | POA: Diagnosis not present

## 2020-11-21 DIAGNOSIS — M792 Neuralgia and neuritis, unspecified: Secondary | ICD-10-CM | POA: Diagnosis not present

## 2020-11-22 DIAGNOSIS — Z23 Encounter for immunization: Secondary | ICD-10-CM | POA: Diagnosis not present

## 2020-11-22 DIAGNOSIS — I700 Atherosclerosis of aorta: Secondary | ICD-10-CM | POA: Diagnosis not present

## 2020-11-22 DIAGNOSIS — E1122 Type 2 diabetes mellitus with diabetic chronic kidney disease: Secondary | ICD-10-CM | POA: Diagnosis not present

## 2020-11-22 DIAGNOSIS — N183 Chronic kidney disease, stage 3 unspecified: Secondary | ICD-10-CM | POA: Diagnosis not present

## 2020-11-30 DIAGNOSIS — N189 Chronic kidney disease, unspecified: Secondary | ICD-10-CM | POA: Diagnosis not present

## 2020-11-30 DIAGNOSIS — M792 Neuralgia and neuritis, unspecified: Secondary | ICD-10-CM | POA: Diagnosis not present

## 2020-11-30 DIAGNOSIS — I5020 Unspecified systolic (congestive) heart failure: Secondary | ICD-10-CM | POA: Diagnosis not present

## 2020-11-30 DIAGNOSIS — I130 Hypertensive heart and chronic kidney disease with heart failure and stage 1 through stage 4 chronic kidney disease, or unspecified chronic kidney disease: Secondary | ICD-10-CM | POA: Diagnosis not present

## 2020-11-30 DIAGNOSIS — M150 Primary generalized (osteo)arthritis: Secondary | ICD-10-CM | POA: Diagnosis not present

## 2020-11-30 DIAGNOSIS — M5136 Other intervertebral disc degeneration, lumbar region: Secondary | ICD-10-CM | POA: Diagnosis not present

## 2020-11-30 DIAGNOSIS — E1122 Type 2 diabetes mellitus with diabetic chronic kidney disease: Secondary | ICD-10-CM | POA: Diagnosis not present

## 2020-11-30 DIAGNOSIS — I5031 Acute diastolic (congestive) heart failure: Secondary | ICD-10-CM | POA: Diagnosis not present

## 2020-11-30 DIAGNOSIS — M48062 Spinal stenosis, lumbar region with neurogenic claudication: Secondary | ICD-10-CM | POA: Diagnosis not present

## 2020-12-01 DIAGNOSIS — M47816 Spondylosis without myelopathy or radiculopathy, lumbar region: Secondary | ICD-10-CM | POA: Diagnosis not present

## 2020-12-01 DIAGNOSIS — M48062 Spinal stenosis, lumbar region with neurogenic claudication: Secondary | ICD-10-CM | POA: Diagnosis not present

## 2020-12-01 DIAGNOSIS — I5031 Acute diastolic (congestive) heart failure: Secondary | ICD-10-CM | POA: Diagnosis not present

## 2020-12-01 DIAGNOSIS — M5136 Other intervertebral disc degeneration, lumbar region: Secondary | ICD-10-CM | POA: Diagnosis not present

## 2020-12-01 DIAGNOSIS — M150 Primary generalized (osteo)arthritis: Secondary | ICD-10-CM | POA: Diagnosis not present

## 2020-12-01 DIAGNOSIS — M792 Neuralgia and neuritis, unspecified: Secondary | ICD-10-CM | POA: Diagnosis not present

## 2020-12-01 DIAGNOSIS — E1122 Type 2 diabetes mellitus with diabetic chronic kidney disease: Secondary | ICD-10-CM | POA: Diagnosis not present

## 2020-12-01 DIAGNOSIS — I5020 Unspecified systolic (congestive) heart failure: Secondary | ICD-10-CM | POA: Diagnosis not present

## 2020-12-01 DIAGNOSIS — N189 Chronic kidney disease, unspecified: Secondary | ICD-10-CM | POA: Diagnosis not present

## 2020-12-01 DIAGNOSIS — I130 Hypertensive heart and chronic kidney disease with heart failure and stage 1 through stage 4 chronic kidney disease, or unspecified chronic kidney disease: Secondary | ICD-10-CM | POA: Diagnosis not present

## 2020-12-05 DIAGNOSIS — I5020 Unspecified systolic (congestive) heart failure: Secondary | ICD-10-CM | POA: Diagnosis not present

## 2020-12-05 DIAGNOSIS — I5031 Acute diastolic (congestive) heart failure: Secondary | ICD-10-CM | POA: Diagnosis not present

## 2020-12-05 DIAGNOSIS — M792 Neuralgia and neuritis, unspecified: Secondary | ICD-10-CM | POA: Diagnosis not present

## 2020-12-05 DIAGNOSIS — E1122 Type 2 diabetes mellitus with diabetic chronic kidney disease: Secondary | ICD-10-CM | POA: Diagnosis not present

## 2020-12-05 DIAGNOSIS — N189 Chronic kidney disease, unspecified: Secondary | ICD-10-CM | POA: Diagnosis not present

## 2020-12-05 DIAGNOSIS — I130 Hypertensive heart and chronic kidney disease with heart failure and stage 1 through stage 4 chronic kidney disease, or unspecified chronic kidney disease: Secondary | ICD-10-CM | POA: Diagnosis not present

## 2020-12-05 DIAGNOSIS — M150 Primary generalized (osteo)arthritis: Secondary | ICD-10-CM | POA: Diagnosis not present

## 2020-12-05 DIAGNOSIS — M48062 Spinal stenosis, lumbar region with neurogenic claudication: Secondary | ICD-10-CM | POA: Diagnosis not present

## 2020-12-05 DIAGNOSIS — M5136 Other intervertebral disc degeneration, lumbar region: Secondary | ICD-10-CM | POA: Diagnosis not present

## 2020-12-07 DIAGNOSIS — I5031 Acute diastolic (congestive) heart failure: Secondary | ICD-10-CM | POA: Diagnosis not present

## 2020-12-07 DIAGNOSIS — I130 Hypertensive heart and chronic kidney disease with heart failure and stage 1 through stage 4 chronic kidney disease, or unspecified chronic kidney disease: Secondary | ICD-10-CM | POA: Diagnosis not present

## 2020-12-07 DIAGNOSIS — M792 Neuralgia and neuritis, unspecified: Secondary | ICD-10-CM | POA: Diagnosis not present

## 2020-12-07 DIAGNOSIS — M48062 Spinal stenosis, lumbar region with neurogenic claudication: Secondary | ICD-10-CM | POA: Diagnosis not present

## 2020-12-07 DIAGNOSIS — I5020 Unspecified systolic (congestive) heart failure: Secondary | ICD-10-CM | POA: Diagnosis not present

## 2020-12-07 DIAGNOSIS — M150 Primary generalized (osteo)arthritis: Secondary | ICD-10-CM | POA: Diagnosis not present

## 2020-12-07 DIAGNOSIS — N189 Chronic kidney disease, unspecified: Secondary | ICD-10-CM | POA: Diagnosis not present

## 2020-12-07 DIAGNOSIS — M5136 Other intervertebral disc degeneration, lumbar region: Secondary | ICD-10-CM | POA: Diagnosis not present

## 2020-12-07 DIAGNOSIS — E1122 Type 2 diabetes mellitus with diabetic chronic kidney disease: Secondary | ICD-10-CM | POA: Diagnosis not present

## 2020-12-13 DIAGNOSIS — M48062 Spinal stenosis, lumbar region with neurogenic claudication: Secondary | ICD-10-CM | POA: Diagnosis not present

## 2020-12-13 DIAGNOSIS — M5136 Other intervertebral disc degeneration, lumbar region: Secondary | ICD-10-CM | POA: Diagnosis not present

## 2020-12-13 DIAGNOSIS — N189 Chronic kidney disease, unspecified: Secondary | ICD-10-CM | POA: Diagnosis not present

## 2020-12-13 DIAGNOSIS — I5020 Unspecified systolic (congestive) heart failure: Secondary | ICD-10-CM | POA: Diagnosis not present

## 2020-12-13 DIAGNOSIS — I130 Hypertensive heart and chronic kidney disease with heart failure and stage 1 through stage 4 chronic kidney disease, or unspecified chronic kidney disease: Secondary | ICD-10-CM | POA: Diagnosis not present

## 2020-12-13 DIAGNOSIS — M792 Neuralgia and neuritis, unspecified: Secondary | ICD-10-CM | POA: Diagnosis not present

## 2020-12-13 DIAGNOSIS — E1122 Type 2 diabetes mellitus with diabetic chronic kidney disease: Secondary | ICD-10-CM | POA: Diagnosis not present

## 2020-12-13 DIAGNOSIS — M150 Primary generalized (osteo)arthritis: Secondary | ICD-10-CM | POA: Diagnosis not present

## 2020-12-13 DIAGNOSIS — I5031 Acute diastolic (congestive) heart failure: Secondary | ICD-10-CM | POA: Diagnosis not present

## 2020-12-16 DIAGNOSIS — E1122 Type 2 diabetes mellitus with diabetic chronic kidney disease: Secondary | ICD-10-CM | POA: Diagnosis not present

## 2020-12-16 DIAGNOSIS — I5031 Acute diastolic (congestive) heart failure: Secondary | ICD-10-CM | POA: Diagnosis not present

## 2020-12-16 DIAGNOSIS — M792 Neuralgia and neuritis, unspecified: Secondary | ICD-10-CM | POA: Diagnosis not present

## 2020-12-16 DIAGNOSIS — M150 Primary generalized (osteo)arthritis: Secondary | ICD-10-CM | POA: Diagnosis not present

## 2020-12-16 DIAGNOSIS — N189 Chronic kidney disease, unspecified: Secondary | ICD-10-CM | POA: Diagnosis not present

## 2020-12-16 DIAGNOSIS — M5136 Other intervertebral disc degeneration, lumbar region: Secondary | ICD-10-CM | POA: Diagnosis not present

## 2020-12-16 DIAGNOSIS — I5020 Unspecified systolic (congestive) heart failure: Secondary | ICD-10-CM | POA: Diagnosis not present

## 2020-12-16 DIAGNOSIS — M48062 Spinal stenosis, lumbar region with neurogenic claudication: Secondary | ICD-10-CM | POA: Diagnosis not present

## 2020-12-16 DIAGNOSIS — I130 Hypertensive heart and chronic kidney disease with heart failure and stage 1 through stage 4 chronic kidney disease, or unspecified chronic kidney disease: Secondary | ICD-10-CM | POA: Diagnosis not present

## 2020-12-20 DIAGNOSIS — M792 Neuralgia and neuritis, unspecified: Secondary | ICD-10-CM | POA: Diagnosis not present

## 2020-12-20 DIAGNOSIS — M48062 Spinal stenosis, lumbar region with neurogenic claudication: Secondary | ICD-10-CM | POA: Diagnosis not present

## 2020-12-20 DIAGNOSIS — N189 Chronic kidney disease, unspecified: Secondary | ICD-10-CM | POA: Diagnosis not present

## 2020-12-20 DIAGNOSIS — I130 Hypertensive heart and chronic kidney disease with heart failure and stage 1 through stage 4 chronic kidney disease, or unspecified chronic kidney disease: Secondary | ICD-10-CM | POA: Diagnosis not present

## 2020-12-20 DIAGNOSIS — E1122 Type 2 diabetes mellitus with diabetic chronic kidney disease: Secondary | ICD-10-CM | POA: Diagnosis not present

## 2020-12-20 DIAGNOSIS — I5020 Unspecified systolic (congestive) heart failure: Secondary | ICD-10-CM | POA: Diagnosis not present

## 2020-12-20 DIAGNOSIS — M5136 Other intervertebral disc degeneration, lumbar region: Secondary | ICD-10-CM | POA: Diagnosis not present

## 2020-12-20 DIAGNOSIS — I5031 Acute diastolic (congestive) heart failure: Secondary | ICD-10-CM | POA: Diagnosis not present

## 2020-12-20 DIAGNOSIS — M150 Primary generalized (osteo)arthritis: Secondary | ICD-10-CM | POA: Diagnosis not present

## 2020-12-26 DIAGNOSIS — N189 Chronic kidney disease, unspecified: Secondary | ICD-10-CM | POA: Diagnosis not present

## 2020-12-26 DIAGNOSIS — E1122 Type 2 diabetes mellitus with diabetic chronic kidney disease: Secondary | ICD-10-CM | POA: Diagnosis not present

## 2020-12-26 DIAGNOSIS — I130 Hypertensive heart and chronic kidney disease with heart failure and stage 1 through stage 4 chronic kidney disease, or unspecified chronic kidney disease: Secondary | ICD-10-CM | POA: Diagnosis not present

## 2020-12-26 DIAGNOSIS — M792 Neuralgia and neuritis, unspecified: Secondary | ICD-10-CM | POA: Diagnosis not present

## 2020-12-26 DIAGNOSIS — M5136 Other intervertebral disc degeneration, lumbar region: Secondary | ICD-10-CM | POA: Diagnosis not present

## 2020-12-26 DIAGNOSIS — I5031 Acute diastolic (congestive) heart failure: Secondary | ICD-10-CM | POA: Diagnosis not present

## 2020-12-26 DIAGNOSIS — M150 Primary generalized (osteo)arthritis: Secondary | ICD-10-CM | POA: Diagnosis not present

## 2020-12-26 DIAGNOSIS — I5020 Unspecified systolic (congestive) heart failure: Secondary | ICD-10-CM | POA: Diagnosis not present

## 2020-12-26 DIAGNOSIS — M48062 Spinal stenosis, lumbar region with neurogenic claudication: Secondary | ICD-10-CM | POA: Diagnosis not present

## 2020-12-28 DIAGNOSIS — M792 Neuralgia and neuritis, unspecified: Secondary | ICD-10-CM | POA: Diagnosis not present

## 2020-12-28 DIAGNOSIS — I130 Hypertensive heart and chronic kidney disease with heart failure and stage 1 through stage 4 chronic kidney disease, or unspecified chronic kidney disease: Secondary | ICD-10-CM | POA: Diagnosis not present

## 2020-12-28 DIAGNOSIS — I5031 Acute diastolic (congestive) heart failure: Secondary | ICD-10-CM | POA: Diagnosis not present

## 2020-12-28 DIAGNOSIS — N189 Chronic kidney disease, unspecified: Secondary | ICD-10-CM | POA: Diagnosis not present

## 2020-12-28 DIAGNOSIS — M150 Primary generalized (osteo)arthritis: Secondary | ICD-10-CM | POA: Diagnosis not present

## 2020-12-28 DIAGNOSIS — M48062 Spinal stenosis, lumbar region with neurogenic claudication: Secondary | ICD-10-CM | POA: Diagnosis not present

## 2020-12-28 DIAGNOSIS — E1122 Type 2 diabetes mellitus with diabetic chronic kidney disease: Secondary | ICD-10-CM | POA: Diagnosis not present

## 2020-12-28 DIAGNOSIS — M5136 Other intervertebral disc degeneration, lumbar region: Secondary | ICD-10-CM | POA: Diagnosis not present

## 2020-12-28 DIAGNOSIS — I5020 Unspecified systolic (congestive) heart failure: Secondary | ICD-10-CM | POA: Diagnosis not present

## 2020-12-29 DIAGNOSIS — I130 Hypertensive heart and chronic kidney disease with heart failure and stage 1 through stage 4 chronic kidney disease, or unspecified chronic kidney disease: Secondary | ICD-10-CM | POA: Diagnosis not present

## 2020-12-29 DIAGNOSIS — M792 Neuralgia and neuritis, unspecified: Secondary | ICD-10-CM | POA: Diagnosis not present

## 2020-12-29 DIAGNOSIS — I5020 Unspecified systolic (congestive) heart failure: Secondary | ICD-10-CM | POA: Diagnosis not present

## 2020-12-29 DIAGNOSIS — M5136 Other intervertebral disc degeneration, lumbar region: Secondary | ICD-10-CM | POA: Diagnosis not present

## 2020-12-29 DIAGNOSIS — M48062 Spinal stenosis, lumbar region with neurogenic claudication: Secondary | ICD-10-CM | POA: Diagnosis not present

## 2020-12-29 DIAGNOSIS — I5031 Acute diastolic (congestive) heart failure: Secondary | ICD-10-CM | POA: Diagnosis not present

## 2020-12-29 DIAGNOSIS — E1122 Type 2 diabetes mellitus with diabetic chronic kidney disease: Secondary | ICD-10-CM | POA: Diagnosis not present

## 2020-12-29 DIAGNOSIS — N189 Chronic kidney disease, unspecified: Secondary | ICD-10-CM | POA: Diagnosis not present

## 2020-12-29 DIAGNOSIS — M150 Primary generalized (osteo)arthritis: Secondary | ICD-10-CM | POA: Diagnosis not present

## 2021-01-02 DIAGNOSIS — M48062 Spinal stenosis, lumbar region with neurogenic claudication: Secondary | ICD-10-CM | POA: Diagnosis not present

## 2021-01-02 DIAGNOSIS — I130 Hypertensive heart and chronic kidney disease with heart failure and stage 1 through stage 4 chronic kidney disease, or unspecified chronic kidney disease: Secondary | ICD-10-CM | POA: Diagnosis not present

## 2021-01-02 DIAGNOSIS — M150 Primary generalized (osteo)arthritis: Secondary | ICD-10-CM | POA: Diagnosis not present

## 2021-01-02 DIAGNOSIS — M5136 Other intervertebral disc degeneration, lumbar region: Secondary | ICD-10-CM | POA: Diagnosis not present

## 2021-01-02 DIAGNOSIS — I5031 Acute diastolic (congestive) heart failure: Secondary | ICD-10-CM | POA: Diagnosis not present

## 2021-01-02 DIAGNOSIS — E1122 Type 2 diabetes mellitus with diabetic chronic kidney disease: Secondary | ICD-10-CM | POA: Diagnosis not present

## 2021-01-02 DIAGNOSIS — I5020 Unspecified systolic (congestive) heart failure: Secondary | ICD-10-CM | POA: Diagnosis not present

## 2021-01-02 DIAGNOSIS — N189 Chronic kidney disease, unspecified: Secondary | ICD-10-CM | POA: Diagnosis not present

## 2021-01-02 DIAGNOSIS — M792 Neuralgia and neuritis, unspecified: Secondary | ICD-10-CM | POA: Diagnosis not present

## 2021-01-04 DIAGNOSIS — I130 Hypertensive heart and chronic kidney disease with heart failure and stage 1 through stage 4 chronic kidney disease, or unspecified chronic kidney disease: Secondary | ICD-10-CM | POA: Diagnosis not present

## 2021-01-04 DIAGNOSIS — N189 Chronic kidney disease, unspecified: Secondary | ICD-10-CM | POA: Diagnosis not present

## 2021-01-04 DIAGNOSIS — M48062 Spinal stenosis, lumbar region with neurogenic claudication: Secondary | ICD-10-CM | POA: Diagnosis not present

## 2021-01-04 DIAGNOSIS — I5031 Acute diastolic (congestive) heart failure: Secondary | ICD-10-CM | POA: Diagnosis not present

## 2021-01-04 DIAGNOSIS — I5020 Unspecified systolic (congestive) heart failure: Secondary | ICD-10-CM | POA: Diagnosis not present

## 2021-01-04 DIAGNOSIS — M5136 Other intervertebral disc degeneration, lumbar region: Secondary | ICD-10-CM | POA: Diagnosis not present

## 2021-01-04 DIAGNOSIS — M792 Neuralgia and neuritis, unspecified: Secondary | ICD-10-CM | POA: Diagnosis not present

## 2021-01-04 DIAGNOSIS — M150 Primary generalized (osteo)arthritis: Secondary | ICD-10-CM | POA: Diagnosis not present

## 2021-01-04 DIAGNOSIS — E1122 Type 2 diabetes mellitus with diabetic chronic kidney disease: Secondary | ICD-10-CM | POA: Diagnosis not present

## 2021-01-10 DIAGNOSIS — N189 Chronic kidney disease, unspecified: Secondary | ICD-10-CM | POA: Diagnosis not present

## 2021-01-10 DIAGNOSIS — M5136 Other intervertebral disc degeneration, lumbar region: Secondary | ICD-10-CM | POA: Diagnosis not present

## 2021-01-10 DIAGNOSIS — I5020 Unspecified systolic (congestive) heart failure: Secondary | ICD-10-CM | POA: Diagnosis not present

## 2021-01-10 DIAGNOSIS — M150 Primary generalized (osteo)arthritis: Secondary | ICD-10-CM | POA: Diagnosis not present

## 2021-01-10 DIAGNOSIS — I5031 Acute diastolic (congestive) heart failure: Secondary | ICD-10-CM | POA: Diagnosis not present

## 2021-01-10 DIAGNOSIS — M48062 Spinal stenosis, lumbar region with neurogenic claudication: Secondary | ICD-10-CM | POA: Diagnosis not present

## 2021-01-10 DIAGNOSIS — I130 Hypertensive heart and chronic kidney disease with heart failure and stage 1 through stage 4 chronic kidney disease, or unspecified chronic kidney disease: Secondary | ICD-10-CM | POA: Diagnosis not present

## 2021-01-10 DIAGNOSIS — E1122 Type 2 diabetes mellitus with diabetic chronic kidney disease: Secondary | ICD-10-CM | POA: Diagnosis not present

## 2021-01-10 DIAGNOSIS — M792 Neuralgia and neuritis, unspecified: Secondary | ICD-10-CM | POA: Diagnosis not present

## 2021-01-11 DIAGNOSIS — N189 Chronic kidney disease, unspecified: Secondary | ICD-10-CM | POA: Diagnosis not present

## 2021-01-11 DIAGNOSIS — M5136 Other intervertebral disc degeneration, lumbar region: Secondary | ICD-10-CM | POA: Diagnosis not present

## 2021-01-11 DIAGNOSIS — E1122 Type 2 diabetes mellitus with diabetic chronic kidney disease: Secondary | ICD-10-CM | POA: Diagnosis not present

## 2021-01-11 DIAGNOSIS — M48062 Spinal stenosis, lumbar region with neurogenic claudication: Secondary | ICD-10-CM | POA: Diagnosis not present

## 2021-01-11 DIAGNOSIS — I5020 Unspecified systolic (congestive) heart failure: Secondary | ICD-10-CM | POA: Diagnosis not present

## 2021-01-11 DIAGNOSIS — I5031 Acute diastolic (congestive) heart failure: Secondary | ICD-10-CM | POA: Diagnosis not present

## 2021-01-11 DIAGNOSIS — M150 Primary generalized (osteo)arthritis: Secondary | ICD-10-CM | POA: Diagnosis not present

## 2021-01-11 DIAGNOSIS — I130 Hypertensive heart and chronic kidney disease with heart failure and stage 1 through stage 4 chronic kidney disease, or unspecified chronic kidney disease: Secondary | ICD-10-CM | POA: Diagnosis not present

## 2021-01-11 DIAGNOSIS — M792 Neuralgia and neuritis, unspecified: Secondary | ICD-10-CM | POA: Diagnosis not present

## 2021-01-19 DIAGNOSIS — E1122 Type 2 diabetes mellitus with diabetic chronic kidney disease: Secondary | ICD-10-CM | POA: Diagnosis not present

## 2021-01-19 DIAGNOSIS — I5031 Acute diastolic (congestive) heart failure: Secondary | ICD-10-CM | POA: Diagnosis not present

## 2021-01-19 DIAGNOSIS — M5136 Other intervertebral disc degeneration, lumbar region: Secondary | ICD-10-CM | POA: Diagnosis not present

## 2021-01-19 DIAGNOSIS — I5020 Unspecified systolic (congestive) heart failure: Secondary | ICD-10-CM | POA: Diagnosis not present

## 2021-01-19 DIAGNOSIS — M150 Primary generalized (osteo)arthritis: Secondary | ICD-10-CM | POA: Diagnosis not present

## 2021-01-19 DIAGNOSIS — M48062 Spinal stenosis, lumbar region with neurogenic claudication: Secondary | ICD-10-CM | POA: Diagnosis not present

## 2021-01-19 DIAGNOSIS — N189 Chronic kidney disease, unspecified: Secondary | ICD-10-CM | POA: Diagnosis not present

## 2021-01-19 DIAGNOSIS — M792 Neuralgia and neuritis, unspecified: Secondary | ICD-10-CM | POA: Diagnosis not present

## 2021-01-19 DIAGNOSIS — I130 Hypertensive heart and chronic kidney disease with heart failure and stage 1 through stage 4 chronic kidney disease, or unspecified chronic kidney disease: Secondary | ICD-10-CM | POA: Diagnosis not present

## 2021-01-24 DIAGNOSIS — E1122 Type 2 diabetes mellitus with diabetic chronic kidney disease: Secondary | ICD-10-CM | POA: Diagnosis not present

## 2021-01-24 DIAGNOSIS — N189 Chronic kidney disease, unspecified: Secondary | ICD-10-CM | POA: Diagnosis not present

## 2021-01-24 DIAGNOSIS — M150 Primary generalized (osteo)arthritis: Secondary | ICD-10-CM | POA: Diagnosis not present

## 2021-01-24 DIAGNOSIS — I5020 Unspecified systolic (congestive) heart failure: Secondary | ICD-10-CM | POA: Diagnosis not present

## 2021-01-24 DIAGNOSIS — M48062 Spinal stenosis, lumbar region with neurogenic claudication: Secondary | ICD-10-CM | POA: Diagnosis not present

## 2021-01-24 DIAGNOSIS — M5136 Other intervertebral disc degeneration, lumbar region: Secondary | ICD-10-CM | POA: Diagnosis not present

## 2021-01-24 DIAGNOSIS — M792 Neuralgia and neuritis, unspecified: Secondary | ICD-10-CM | POA: Diagnosis not present

## 2021-01-24 DIAGNOSIS — I5031 Acute diastolic (congestive) heart failure: Secondary | ICD-10-CM | POA: Diagnosis not present

## 2021-01-24 DIAGNOSIS — I130 Hypertensive heart and chronic kidney disease with heart failure and stage 1 through stage 4 chronic kidney disease, or unspecified chronic kidney disease: Secondary | ICD-10-CM | POA: Diagnosis not present

## 2021-01-25 DIAGNOSIS — I130 Hypertensive heart and chronic kidney disease with heart failure and stage 1 through stage 4 chronic kidney disease, or unspecified chronic kidney disease: Secondary | ICD-10-CM | POA: Diagnosis not present

## 2021-01-25 DIAGNOSIS — I5020 Unspecified systolic (congestive) heart failure: Secondary | ICD-10-CM | POA: Diagnosis not present

## 2021-01-25 DIAGNOSIS — N189 Chronic kidney disease, unspecified: Secondary | ICD-10-CM | POA: Diagnosis not present

## 2021-01-25 DIAGNOSIS — E1122 Type 2 diabetes mellitus with diabetic chronic kidney disease: Secondary | ICD-10-CM | POA: Diagnosis not present

## 2021-01-25 DIAGNOSIS — I5031 Acute diastolic (congestive) heart failure: Secondary | ICD-10-CM | POA: Diagnosis not present

## 2021-01-25 DIAGNOSIS — M48062 Spinal stenosis, lumbar region with neurogenic claudication: Secondary | ICD-10-CM | POA: Diagnosis not present

## 2021-01-25 DIAGNOSIS — M5136 Other intervertebral disc degeneration, lumbar region: Secondary | ICD-10-CM | POA: Diagnosis not present

## 2021-01-25 DIAGNOSIS — M150 Primary generalized (osteo)arthritis: Secondary | ICD-10-CM | POA: Diagnosis not present

## 2021-01-25 DIAGNOSIS — M792 Neuralgia and neuritis, unspecified: Secondary | ICD-10-CM | POA: Diagnosis not present

## 2021-01-30 DIAGNOSIS — E1122 Type 2 diabetes mellitus with diabetic chronic kidney disease: Secondary | ICD-10-CM | POA: Diagnosis not present

## 2021-01-30 DIAGNOSIS — I5031 Acute diastolic (congestive) heart failure: Secondary | ICD-10-CM | POA: Diagnosis not present

## 2021-01-30 DIAGNOSIS — M150 Primary generalized (osteo)arthritis: Secondary | ICD-10-CM | POA: Diagnosis not present

## 2021-01-30 DIAGNOSIS — I5020 Unspecified systolic (congestive) heart failure: Secondary | ICD-10-CM | POA: Diagnosis not present

## 2021-01-30 DIAGNOSIS — M792 Neuralgia and neuritis, unspecified: Secondary | ICD-10-CM | POA: Diagnosis not present

## 2021-01-30 DIAGNOSIS — I130 Hypertensive heart and chronic kidney disease with heart failure and stage 1 through stage 4 chronic kidney disease, or unspecified chronic kidney disease: Secondary | ICD-10-CM | POA: Diagnosis not present

## 2021-01-30 DIAGNOSIS — M5136 Other intervertebral disc degeneration, lumbar region: Secondary | ICD-10-CM | POA: Diagnosis not present

## 2021-01-30 DIAGNOSIS — M48062 Spinal stenosis, lumbar region with neurogenic claudication: Secondary | ICD-10-CM | POA: Diagnosis not present

## 2021-01-30 DIAGNOSIS — N189 Chronic kidney disease, unspecified: Secondary | ICD-10-CM | POA: Diagnosis not present

## 2021-02-13 DIAGNOSIS — I5030 Unspecified diastolic (congestive) heart failure: Secondary | ICD-10-CM | POA: Diagnosis not present

## 2021-02-13 DIAGNOSIS — J4540 Moderate persistent asthma, uncomplicated: Secondary | ICD-10-CM | POA: Diagnosis not present

## 2021-05-18 DIAGNOSIS — E1169 Type 2 diabetes mellitus with other specified complication: Secondary | ICD-10-CM | POA: Diagnosis not present

## 2021-05-18 DIAGNOSIS — E782 Mixed hyperlipidemia: Secondary | ICD-10-CM | POA: Diagnosis not present

## 2021-05-25 DIAGNOSIS — E1122 Type 2 diabetes mellitus with diabetic chronic kidney disease: Secondary | ICD-10-CM | POA: Diagnosis not present

## 2021-05-25 DIAGNOSIS — Z0000 Encounter for general adult medical examination without abnormal findings: Secondary | ICD-10-CM | POA: Diagnosis not present

## 2021-05-25 DIAGNOSIS — J45909 Unspecified asthma, uncomplicated: Secondary | ICD-10-CM | POA: Diagnosis not present

## 2021-05-25 DIAGNOSIS — B349 Viral infection, unspecified: Secondary | ICD-10-CM | POA: Diagnosis not present

## 2021-05-25 DIAGNOSIS — N183 Chronic kidney disease, stage 3 unspecified: Secondary | ICD-10-CM | POA: Diagnosis not present

## 2021-06-01 DIAGNOSIS — G5603 Carpal tunnel syndrome, bilateral upper limbs: Secondary | ICD-10-CM | POA: Diagnosis not present

## 2021-06-01 DIAGNOSIS — J45909 Unspecified asthma, uncomplicated: Secondary | ICD-10-CM | POA: Diagnosis not present

## 2021-07-26 DIAGNOSIS — Z0100 Encounter for examination of eyes and vision without abnormal findings: Secondary | ICD-10-CM | POA: Diagnosis not present

## 2021-07-26 DIAGNOSIS — I10 Essential (primary) hypertension: Secondary | ICD-10-CM | POA: Diagnosis not present

## 2021-07-26 DIAGNOSIS — E109 Type 1 diabetes mellitus without complications: Secondary | ICD-10-CM | POA: Diagnosis not present

## 2021-07-31 DIAGNOSIS — G5603 Carpal tunnel syndrome, bilateral upper limbs: Secondary | ICD-10-CM | POA: Diagnosis not present

## 2021-08-22 DIAGNOSIS — M19072 Primary osteoarthritis, left ankle and foot: Secondary | ICD-10-CM | POA: Diagnosis not present

## 2021-08-22 DIAGNOSIS — M85872 Other specified disorders of bone density and structure, left ankle and foot: Secondary | ICD-10-CM | POA: Diagnosis not present

## 2021-08-22 DIAGNOSIS — M79672 Pain in left foot: Secondary | ICD-10-CM | POA: Diagnosis not present

## 2021-09-05 DIAGNOSIS — R200 Anesthesia of skin: Secondary | ICD-10-CM | POA: Diagnosis not present

## 2021-10-04 DIAGNOSIS — G5602 Carpal tunnel syndrome, left upper limb: Secondary | ICD-10-CM | POA: Diagnosis not present

## 2021-11-13 DIAGNOSIS — Z01818 Encounter for other preprocedural examination: Secondary | ICD-10-CM | POA: Diagnosis not present

## 2021-11-13 DIAGNOSIS — R0602 Shortness of breath: Secondary | ICD-10-CM | POA: Diagnosis not present

## 2021-11-13 DIAGNOSIS — J4540 Moderate persistent asthma, uncomplicated: Secondary | ICD-10-CM | POA: Diagnosis not present

## 2021-11-20 DIAGNOSIS — E1169 Type 2 diabetes mellitus with other specified complication: Secondary | ICD-10-CM | POA: Diagnosis not present

## 2021-11-20 DIAGNOSIS — E782 Mixed hyperlipidemia: Secondary | ICD-10-CM | POA: Diagnosis not present

## 2021-11-27 DIAGNOSIS — J42 Unspecified chronic bronchitis: Secondary | ICD-10-CM | POA: Diagnosis not present

## 2021-11-27 DIAGNOSIS — Z23 Encounter for immunization: Secondary | ICD-10-CM | POA: Diagnosis not present

## 2021-11-27 DIAGNOSIS — E119 Type 2 diabetes mellitus without complications: Secondary | ICD-10-CM | POA: Diagnosis not present

## 2022-01-08 DIAGNOSIS — N184 Chronic kidney disease, stage 4 (severe): Secondary | ICD-10-CM | POA: Diagnosis not present

## 2022-01-08 DIAGNOSIS — E1122 Type 2 diabetes mellitus with diabetic chronic kidney disease: Secondary | ICD-10-CM | POA: Diagnosis not present

## 2022-01-08 DIAGNOSIS — Z0000 Encounter for general adult medical examination without abnormal findings: Secondary | ICD-10-CM | POA: Diagnosis not present

## 2022-06-20 DIAGNOSIS — E1169 Type 2 diabetes mellitus with other specified complication: Secondary | ICD-10-CM | POA: Diagnosis not present

## 2022-06-20 DIAGNOSIS — E782 Mixed hyperlipidemia: Secondary | ICD-10-CM | POA: Diagnosis not present

## 2022-06-20 DIAGNOSIS — E538 Deficiency of other specified B group vitamins: Secondary | ICD-10-CM | POA: Diagnosis not present

## 2022-06-27 DIAGNOSIS — E1122 Type 2 diabetes mellitus with diabetic chronic kidney disease: Secondary | ICD-10-CM | POA: Diagnosis not present

## 2022-06-27 DIAGNOSIS — N183 Chronic kidney disease, stage 3 unspecified: Secondary | ICD-10-CM | POA: Diagnosis not present

## 2022-06-27 DIAGNOSIS — E785 Hyperlipidemia, unspecified: Secondary | ICD-10-CM | POA: Diagnosis not present

## 2022-06-27 DIAGNOSIS — I2781 Cor pulmonale (chronic): Secondary | ICD-10-CM | POA: Diagnosis not present

## 2022-06-27 DIAGNOSIS — Z0000 Encounter for general adult medical examination without abnormal findings: Secondary | ICD-10-CM | POA: Diagnosis not present

## 2022-06-27 DIAGNOSIS — I700 Atherosclerosis of aorta: Secondary | ICD-10-CM | POA: Diagnosis not present

## 2022-12-14 DIAGNOSIS — E782 Mixed hyperlipidemia: Secondary | ICD-10-CM | POA: Diagnosis not present

## 2022-12-14 DIAGNOSIS — E1169 Type 2 diabetes mellitus with other specified complication: Secondary | ICD-10-CM | POA: Diagnosis not present

## 2023-01-09 DIAGNOSIS — E782 Mixed hyperlipidemia: Secondary | ICD-10-CM | POA: Diagnosis not present

## 2023-01-09 DIAGNOSIS — E1169 Type 2 diabetes mellitus with other specified complication: Secondary | ICD-10-CM | POA: Diagnosis not present

## 2023-01-14 DIAGNOSIS — E119 Type 2 diabetes mellitus without complications: Secondary | ICD-10-CM | POA: Diagnosis not present

## 2023-01-14 DIAGNOSIS — E785 Hyperlipidemia, unspecified: Secondary | ICD-10-CM | POA: Diagnosis not present

## 2023-01-14 DIAGNOSIS — I2781 Cor pulmonale (chronic): Secondary | ICD-10-CM | POA: Diagnosis not present

## 2023-01-14 DIAGNOSIS — N183 Chronic kidney disease, stage 3 unspecified: Secondary | ICD-10-CM | POA: Diagnosis not present

## 2023-01-14 DIAGNOSIS — Z0000 Encounter for general adult medical examination without abnormal findings: Secondary | ICD-10-CM | POA: Diagnosis not present

## 2023-06-11 DIAGNOSIS — H9202 Otalgia, left ear: Secondary | ICD-10-CM | POA: Diagnosis not present

## 2023-06-11 DIAGNOSIS — I10 Essential (primary) hypertension: Secondary | ICD-10-CM | POA: Diagnosis not present

## 2023-07-01 DIAGNOSIS — E782 Mixed hyperlipidemia: Secondary | ICD-10-CM | POA: Diagnosis not present

## 2023-07-01 DIAGNOSIS — E1169 Type 2 diabetes mellitus with other specified complication: Secondary | ICD-10-CM | POA: Diagnosis not present

## 2023-07-08 DIAGNOSIS — I700 Atherosclerosis of aorta: Secondary | ICD-10-CM | POA: Diagnosis not present

## 2023-07-08 DIAGNOSIS — Z0000 Encounter for general adult medical examination without abnormal findings: Secondary | ICD-10-CM | POA: Diagnosis not present

## 2023-07-08 DIAGNOSIS — E782 Mixed hyperlipidemia: Secondary | ICD-10-CM | POA: Diagnosis not present

## 2023-07-08 DIAGNOSIS — E119 Type 2 diabetes mellitus without complications: Secondary | ICD-10-CM | POA: Diagnosis not present

## 2023-07-08 DIAGNOSIS — F32A Depression, unspecified: Secondary | ICD-10-CM | POA: Diagnosis not present

## 2023-07-08 DIAGNOSIS — M1712 Unilateral primary osteoarthritis, left knee: Secondary | ICD-10-CM | POA: Diagnosis not present

## 2023-07-08 DIAGNOSIS — E1169 Type 2 diabetes mellitus with other specified complication: Secondary | ICD-10-CM | POA: Diagnosis not present

## 2023-12-06 ENCOUNTER — Encounter: Payer: Self-pay | Admitting: Emergency Medicine

## 2023-12-06 ENCOUNTER — Other Ambulatory Visit: Payer: Self-pay

## 2023-12-06 ENCOUNTER — Emergency Department: Payer: Medicare HMO

## 2023-12-06 ENCOUNTER — Observation Stay
Admission: EM | Admit: 2023-12-06 | Discharge: 2023-12-07 | Disposition: A | Discharge status: Home / Self Care | Payer: Medicare HMO | Attending: Hospitalist | Admitting: Hospitalist

## 2023-12-06 DIAGNOSIS — A419 Sepsis, unspecified organism: Secondary | ICD-10-CM | POA: Diagnosis not present

## 2023-12-06 DIAGNOSIS — S0990XA Unspecified injury of head, initial encounter: Secondary | ICD-10-CM | POA: Diagnosis not present

## 2023-12-06 DIAGNOSIS — W19XXXA Unspecified fall, initial encounter: Secondary | ICD-10-CM | POA: Diagnosis not present

## 2023-12-06 DIAGNOSIS — R0781 Pleurodynia: Secondary | ICD-10-CM | POA: Diagnosis not present

## 2023-12-06 DIAGNOSIS — R0600 Dyspnea, unspecified: Secondary | ICD-10-CM | POA: Diagnosis present

## 2023-12-06 DIAGNOSIS — J168 Pneumonia due to other specified infectious organisms: Secondary | ICD-10-CM | POA: Diagnosis not present

## 2023-12-06 DIAGNOSIS — R531 Weakness: Principal | ICD-10-CM | POA: Insufficient documentation

## 2023-12-06 DIAGNOSIS — I482 Chronic atrial fibrillation, unspecified: Secondary | ICD-10-CM | POA: Insufficient documentation

## 2023-12-06 DIAGNOSIS — M545 Low back pain, unspecified: Secondary | ICD-10-CM | POA: Diagnosis not present

## 2023-12-06 DIAGNOSIS — J9811 Atelectasis: Secondary | ICD-10-CM | POA: Diagnosis not present

## 2023-12-06 DIAGNOSIS — I10 Essential (primary) hypertension: Secondary | ICD-10-CM | POA: Diagnosis present

## 2023-12-06 DIAGNOSIS — I700 Atherosclerosis of aorta: Secondary | ICD-10-CM | POA: Diagnosis not present

## 2023-12-06 DIAGNOSIS — E782 Mixed hyperlipidemia: Secondary | ICD-10-CM | POA: Diagnosis present

## 2023-12-06 DIAGNOSIS — N1832 Chronic kidney disease, stage 3b: Secondary | ICD-10-CM | POA: Insufficient documentation

## 2023-12-06 DIAGNOSIS — E8721 Acute metabolic acidosis: Secondary | ICD-10-CM | POA: Insufficient documentation

## 2023-12-06 DIAGNOSIS — Z7984 Long term (current) use of oral hypoglycemic drugs: Secondary | ICD-10-CM | POA: Insufficient documentation

## 2023-12-06 DIAGNOSIS — I129 Hypertensive chronic kidney disease with stage 1 through stage 4 chronic kidney disease, or unspecified chronic kidney disease: Secondary | ICD-10-CM | POA: Diagnosis not present

## 2023-12-06 DIAGNOSIS — Z79899 Other long term (current) drug therapy: Secondary | ICD-10-CM | POA: Insufficient documentation

## 2023-12-06 DIAGNOSIS — G8929 Other chronic pain: Secondary | ICD-10-CM | POA: Diagnosis present

## 2023-12-06 DIAGNOSIS — E1169 Type 2 diabetes mellitus with other specified complication: Secondary | ICD-10-CM | POA: Diagnosis not present

## 2023-12-06 DIAGNOSIS — E039 Hypothyroidism, unspecified: Secondary | ICD-10-CM | POA: Diagnosis not present

## 2023-12-06 DIAGNOSIS — S199XXA Unspecified injury of neck, initial encounter: Secondary | ICD-10-CM | POA: Diagnosis not present

## 2023-12-06 DIAGNOSIS — Y92009 Unspecified place in unspecified non-institutional (private) residence as the place of occurrence of the external cause: Secondary | ICD-10-CM

## 2023-12-06 DIAGNOSIS — R197 Diarrhea, unspecified: Secondary | ICD-10-CM | POA: Diagnosis not present

## 2023-12-06 DIAGNOSIS — Z96651 Presence of right artificial knee joint: Secondary | ICD-10-CM | POA: Diagnosis not present

## 2023-12-06 DIAGNOSIS — N83292 Other ovarian cyst, left side: Secondary | ICD-10-CM | POA: Diagnosis not present

## 2023-12-06 DIAGNOSIS — J189 Pneumonia, unspecified organism: Secondary | ICD-10-CM | POA: Diagnosis not present

## 2023-12-06 DIAGNOSIS — M419 Scoliosis, unspecified: Secondary | ICD-10-CM | POA: Diagnosis not present

## 2023-12-06 DIAGNOSIS — I4891 Unspecified atrial fibrillation: Secondary | ICD-10-CM | POA: Insufficient documentation

## 2023-12-06 DIAGNOSIS — Z043 Encounter for examination and observation following other accident: Secondary | ICD-10-CM | POA: Diagnosis not present

## 2023-12-06 DIAGNOSIS — K529 Noninfective gastroenteritis and colitis, unspecified: Secondary | ICD-10-CM | POA: Diagnosis not present

## 2023-12-06 DIAGNOSIS — E872 Acidosis, unspecified: Secondary | ICD-10-CM | POA: Diagnosis present

## 2023-12-06 DIAGNOSIS — E785 Hyperlipidemia, unspecified: Secondary | ICD-10-CM | POA: Diagnosis not present

## 2023-12-06 DIAGNOSIS — M47814 Spondylosis without myelopathy or radiculopathy, thoracic region: Secondary | ICD-10-CM | POA: Diagnosis not present

## 2023-12-06 LAB — LACTIC ACID, PLASMA: Lactic Acid, Venous: 1.6 mmol/L (ref 0.5–1.9)

## 2023-12-06 LAB — COMPREHENSIVE METABOLIC PANEL
ALT: 20 U/L (ref 0–44)
AST: 22 U/L (ref 15–41)
Albumin: 3.8 g/dL (ref 3.5–5.0)
Alkaline Phosphatase: 79 U/L (ref 38–126)
Anion gap: 13 (ref 5–15)
BUN: 21 mg/dL (ref 8–23)
CO2: 20 mmol/L — ABNORMAL LOW (ref 22–32)
Calcium: 9.3 mg/dL (ref 8.9–10.3)
Chloride: 100 mmol/L (ref 98–111)
Creatinine, Ser: 1.22 mg/dL — ABNORMAL HIGH (ref 0.44–1.00)
GFR, Estimated: 45 mL/min — ABNORMAL LOW (ref >60)
Glucose, Bld: 207 mg/dL — ABNORMAL HIGH (ref 70–99)
Potassium: 4 mmol/L (ref 3.5–5.1)
Sodium: 133 mmol/L — ABNORMAL LOW (ref 135–145)
Total Bilirubin: 1.7 mg/dL — ABNORMAL HIGH (ref <1.2)
Total Protein: 7.4 g/dL (ref 6.5–8.1)

## 2023-12-06 LAB — CK: Total CK: 81 U/L (ref 38–234)

## 2023-12-06 LAB — URINALYSIS, ROUTINE W REFLEX MICROSCOPIC
Bilirubin Urine: NEGATIVE
Glucose, UA: NEGATIVE mg/dL
Hgb urine dipstick: NEGATIVE
Ketones, ur: NEGATIVE mg/dL
Nitrite: NEGATIVE
Protein, ur: 30 mg/dL — AB
Specific Gravity, Urine: 1.028 (ref 1.005–1.030)
pH: 6 (ref 5.0–8.0)

## 2023-12-06 LAB — TROPONIN I (HIGH SENSITIVITY): Troponin I (High Sensitivity): 16 ng/L (ref <18)

## 2023-12-06 LAB — CBC
HCT: 40.5 % (ref 36.0–46.0)
Hemoglobin: 14.3 g/dL (ref 12.0–15.0)
MCH: 29.7 pg (ref 26.0–34.0)
MCHC: 35.3 g/dL (ref 30.0–36.0)
MCV: 84 fL (ref 80.0–100.0)
Platelets: 200 10*3/uL (ref 150–400)
RBC: 4.82 MIL/uL (ref 3.87–5.11)
RDW: 12.7 % (ref 11.5–15.5)
WBC: 19.4 10*3/uL — ABNORMAL HIGH (ref 4.0–10.5)
nRBC: 0 % (ref 0.0–0.2)

## 2023-12-06 LAB — D-DIMER, QUANTITATIVE: D-Dimer, Quant: 0.87 ug{FEU}/mL — ABNORMAL HIGH (ref 0.00–0.50)

## 2023-12-06 LAB — LIPASE, BLOOD: Lipase: 25 U/L (ref 11–51)

## 2023-12-06 MED ORDER — SODIUM CHLORIDE 0.9 % IV SOLN
500.0000 mg | INTRAVENOUS | Status: DC
  Administered 2023-12-06: 500 mg via INTRAVENOUS
  Filled 2023-12-06: qty 5

## 2023-12-06 MED ORDER — IOHEXOL 350 MG/ML SOLN
80.0000 mL | Freq: Once | INTRAVENOUS | Status: AC | PRN
  Administered 2023-12-06: 80 mL via INTRAVENOUS

## 2023-12-06 MED ORDER — SODIUM CHLORIDE 0.9 % IV BOLUS
1000.0000 mL | Freq: Once | INTRAVENOUS | Status: AC
  Administered 2023-12-06: 1000 mL via INTRAVENOUS

## 2023-12-06 MED ORDER — SODIUM CHLORIDE 0.9 % IV SOLN
2.0000 g | INTRAVENOUS | Status: DC
  Administered 2023-12-06: 2 g via INTRAVENOUS
  Filled 2023-12-06: qty 20

## 2023-12-06 NOTE — ED Provider Triage Note (Signed)
Emergency Medicine Provider Triage Evaluation Note  Erika Nelson , a 82 y.o. female  was evaluated in triage.  Pt complains of unwittnessed fall. Patient thinks she laid on the floor for about an hour before her son came home and found her. Patient has had diarrhea since thanksgiving which made her weak and she fell getting off the commode. Patient denies hitting her head and no LOC.  Review of Systems  Positive: Mid back pain, diarrhea Negative:   Physical Exam  BP (!) 145/101 (BP Location: Left Arm)   Pulse 100   Temp 98.5 F (36.9 C) (Oral)   Resp (!) 22   Ht 5\' 5"  (1.651 m)   Wt 74.8 kg   SpO2 92%   BMI 27.46 kg/m  Gen:   Awake, no distress   Resp:  Normal effort  MSK:   Moves extremities without difficulty  Other:    Medical Decision Making  Medically screening exam initiated at 4:03 PM.  Appropriate orders placed.  Newt Lukes was informed that the remainder of the evaluation will be completed by another provider, this initial triage assessment does not replace that evaluation, and the importance of remaining in the ED until their evaluation is complete.     Cameron Ali, PA-C 12/06/23 1608

## 2023-12-06 NOTE — ED Triage Notes (Signed)
Patient to ED via [facility] from home after a fall. Patient noted to have generalized weakness and diarrhea since Thanksgiving. Patient was trying to get off the commode when she fell and laid on the floor for approx 1 hr. Also having some SOB. C/o mid back pain from the fall. Denies hitting head or LOC.

## 2023-12-06 NOTE — Assessment & Plan Note (Signed)
Patient meets sepsis criteria with vitals, source of infection. Will continue with supportive care with antiemetics antipyretics generalized IV fluids at low rate.  Meds ordered this encounter  Medications   sodium chloride 0.9 % bolus 1,000 mL   iohexol (OMNIPAQUE) 350 MG/ML injection 80 mL   cefTRIAXone (ROCEPHIN) 2 g in sodium chloride 0.9 % 100 mL IVPB    Order Specific Question:   Antibiotic Indication:    Answer:   CAP   azithromycin (ZITHROMAX) 500 mg in sodium chloride 0.9 % 250 mL IVPB    Order Specific Question:   Antibiotic Indication:    Answer:   CAP

## 2023-12-06 NOTE — Assessment & Plan Note (Signed)
Fall precaution. PT Eval prior to discharge.

## 2023-12-06 NOTE — Sepsis Progress Note (Signed)
Elink monitoring for the code sepsis protocol.  

## 2023-12-06 NOTE — H&P (Incomplete)
History and Physical    Patient: Erika Nelson WGN:[id number] DOB: August 27, 1941 DOA: 12/06/2023 DOS: the patient was seen and examined on 12/06/2023 PCP: Danella Penton, MD  Patient coming from: Home  Chief Complaint:  Chief Complaint  Patient presents with  . Fall    HPI: Erika Nelson is a 82 y.o. female with medical history significant for allergy to albuterol, Norco, Prozac, sulfa, tramadol, GERD, hypertension, obstructive sleep apnea presenting today for generalized weakness and diarrhea the patient has noted since Thanksgiving patient was getting off the commode and was dizzy she fell and was in the floor for about an hour.  Admission requested for patient is elderly as well and found to be septic from pneumonia.   In emergency room vitals trend shows: Vitals:   12/06/23 1559 12/06/23 1601 12/06/23 1946 12/06/23 2128  BP: (!) 145/101  135/76 (!) 134/59  Pulse: 100  78 79  Temp: 98.5 F (36.9 C)     Resp: (!) 22  18 20   Height:  5\' 5"  (1.651 m)    Weight:  74.8 kg    SpO2: 92%  95% 95%  TempSrc: Oral     BMI (Calculated):  27.46    At bedside patient is alert awake afebrile O2 sats of 95% on room air. Labs are notable for: -EKG showing A-fib RVR at 113 without ST-T wave changes. - CMP shows chronic kidney disease stage IIIa, bicarb of 20, glucose 207, normal anion gap, sodium 133, total bili 1.7, troponin normal at 16, CPK 81, lactic acid of 1.6,. CBC showing leukocytosis of 19.4 otherwise normal differential. Elevated D-dimer at 0.87 and patient found to have negative CT angio chest of pulmonary embolism but left lung pneumonia and upper lobe density that needs to be reevaluated and followed on outpatient basis In the ED pt received: Medications  cefTRIAXone (ROCEPHIN) 2 g in sodium chloride 0.9 % 100 mL IVPB (has no administration in time range)  azithromycin (ZITHROMAX) 500 mg in sodium chloride 0.9 % 250 mL IVPB (has no administration in time range)  sodium chloride 0.9  % bolus 1,000 mL (0 mLs Intravenous Stopped 12/06/23 2118)  iohexol (OMNIPAQUE) 350 MG/ML injection 80 mL (80 mLs Intravenous Contrast Given 12/06/23 2108)   Review of Systems  Respiratory:  Positive for cough.   Gastrointestinal:  Positive for diarrhea and nausea.  Musculoskeletal:  Positive for falls.   Past Medical History:  Diagnosis Date  . Anxiety   . Cervicalgia   . Diverticulosis   . Generalized OA    Osteoarthritis bilateral knees  . GERD (gastroesophageal reflux disease)   . Headache   . Hyperlipidemia   . Hypertension   . Occasional tremors    Past Surgical History:  Procedure Laterality Date  . bilateral foot surgery Bilateral   . COLONOSCOPY WITH PROPOFOL N/A 03/12/2016   Procedure: COLONOSCOPY WITH PROPOFOL;  Surgeon: Christena Deem, MD;  Location: [facility] ENDOSCOPY;  Service: Endoscopy;  Laterality: N/A;  . KNEE ARTHROSCOPY Left   . TOTAL KNEE ARTHROPLASTY Right 07/26/2016   Procedure: TOTAL KNEE ARTHROPLASTY;  Surgeon: Kennedy Bucker, MD;  Location: [facility] ORS;  Service: Orthopedics;  Laterality: Right;    reports that she has never smoked. She has never used smokeless tobacco. She reports that she does not drink alcohol and does not use drugs.  Allergies  Allergen Reactions  . Albuterol   . Norco [Hydrocodone-Acetaminophen] Other (See Comments)    "shaky"  . Prozac [Fluoxetine Hcl] Other (See Comments)    "  shaky"  . Sulfa Antibiotics Rash  . Tramadol Other (See Comments)    Sleepy    Family History  Problem Relation Age of Onset  . Colon cancer Father   . Cirrhosis Mother   . Breast cancer Paternal Grandmother   . Stroke Brother   . Breast cancer Paternal Aunt     Prior to Admission medications   Medication Sig Start Date End Date Taking? Authorizing Provider  budesonide (PULMICORT) 0.5 MG/2ML nebulizer solution Take 0.5 mg by nebulization 2 (two) times daily.    [provider]  Fluticasone-Umeclidin-Vilant (TRELEGY ELLIPTA) 100-62.5-25  MCG/INH AEPB Inhale 1 puff into the lungs daily at 6 (six) AM.    [provider]  glipiZIDE (GLUCOTROL XL) 5 MG 24 hr tablet Take 5 mg by mouth daily. 06/16/20   [provider]  levothyroxine (SYNTHROID, LEVOTHROID) 75 MCG tablet Take 75 mcg by mouth daily before breakfast.    [provider]  metFORMIN (GLUCOPHAGE-XR) 500 MG 24 hr tablet Take 500 mg by mouth 2 (two) times daily. 05/11/20   [provider]  nystatin cream (MYCOSTATIN) Apply 1 application topically 2 (two) times daily.    [provider]  olmesartan ([name]) 20 MG tablet Take 20 mg by mouth daily. 06/16/20   [provider]  omeprazole (PRILOSEC) 40 MG capsule Take 40 mg by mouth daily. 06/16/20   [provider]  simvastatin (ZOCOR) 20 MG tablet Take 20 mg by mouth at bedtime. 06/16/20   [provider]  triamterene-hydrochlorothiazide (MAXZIDE-25) 37.5-25 MG tablet Take 1 tablet by mouth daily. 03/18/20   [provider]   Vitals:   12/06/23 1559 12/06/23 1601 12/06/23 1946 12/06/23 2128  BP: (!) 145/101  135/76 (!) 134/59  Pulse: 100  78 79  Resp: (!) 22  18 20   Temp: 98.5 F (36.9 C)     TempSrc: Oral     SpO2: 92%  95% 95%  Weight:  74.8 kg    Height:  5\' 5"  (1.651 m)     Physical Exam Vitals and nursing note reviewed.  Constitutional:      General: She is not in acute distress. HENT:     Head: Normocephalic and atraumatic.     Right Ear: Hearing normal. There is no impacted cerumen.     Left Ear: Hearing normal.     Nose: Nose normal. No nasal deformity.     Mouth/Throat:     Lips: Pink.     Tongue: No lesions.     Pharynx: Oropharynx is clear.  Eyes:     General: Lids are normal.     Extraocular Movements: Extraocular movements intact.  Cardiovascular:     Rate and Rhythm: Tachycardia present. Rhythm irregular.     Pulses: Normal pulses.     Heart sounds: Normal heart sounds.  Pulmonary:     Effort: Pulmonary effort is  normal.     Breath sounds: Normal breath sounds.  Abdominal:     General: Bowel sounds are normal. There is no distension.     Palpations: Abdomen is soft. There is no mass.     Tenderness: There is no abdominal tenderness.  Musculoskeletal:     Right lower leg: No edema.     Left lower leg: No edema.  Skin:    General: Skin is warm.  Neurological:     General: No focal deficit present.     Mental Status: She is alert and oriented to person, place, and time.  Cranial Nerves: Cranial nerves 2-12 are intact.  Psychiatric:        Attention and Perception: Attention normal.        Mood and Affect: Mood normal.        Speech: Speech normal.        Behavior: Behavior normal. Behavior is cooperative.      Labs on Admission: I have personally reviewed following labs and imaging studies Results for orders placed or performed during the hospital encounter of 12/06/23 (from the past 24 hour(s))  Lipase, blood     Status: None   Collection Time: 12/06/23  4:04 PM  Result Value Ref Range   Lipase 25 11 - 51 U/L  Comprehensive metabolic panel     Status: Abnormal   Collection Time: 12/06/23  4:04 PM  Result Value Ref Range   Sodium 133 (L) 135 - 145 mmol/L   Potassium 4.0 3.5 - 5.1 mmol/L   Chloride 100 98 - 111 mmol/L   CO2 20 (L) 22 - 32 mmol/L   Glucose, Bld 207 (H) 70 - 99 mg/dL   BUN 21 8 - 23 mg/dL   Creatinine, Ser 6.96 (H) 0.44 - 1.00 mg/dL   Calcium 9.3 8.9 - 29.5 mg/dL   Total Protein 7.4 6.5 - 8.1 g/dL   Albumin 3.8 3.5 - 5.0 g/dL   AST 22 15 - 41 U/L   ALT 20 0 - 44 U/L   Alkaline Phosphatase 79 38 - 126 U/L   Total Bilirubin 1.7 (H) <1.2 mg/dL   GFR, Estimated 45 (L) >60 mL/min   Anion gap 13 5 - 15  CBC     Status: Abnormal   Collection Time: 12/06/23  4:04 PM  Result Value Ref Range   WBC 19.4 (H) 4.0 - 10.5 K/uL   RBC 4.82 3.87 - 5.11 MIL/uL   Hemoglobin 14.3 12.0 - 15.0 g/dL   HCT 28.4 13.2 - 44.0 %   MCV 84.0 80.0 - 100.0 fL   MCH 29.7 26.0 - 34.0 pg    MCHC 35.3 30.0 - 36.0 g/dL   RDW 10.2 72.5 - 36.6 %   Platelets 200 150 - 400 K/uL   nRBC 0.0 0.0 - 0.2 %  CK     Status: None   Collection Time: 12/06/23  4:06 PM  Result Value Ref Range   Total CK 81 38 - 234 U/L  Troponin I (High Sensitivity)     Status: None   Collection Time: 12/06/23  4:06 PM  Result Value Ref Range   Troponin I (High Sensitivity) 16 <18 ng/L  D-dimer, quantitative     Status: Abnormal   Collection Time: 12/06/23  8:11 PM  Result Value Ref Range   D-Dimer, Quant 0.87 (H) 0.00 - 0.50 ug/mL-FEU  Lactic acid, plasma     Status: None   Collection Time: 12/06/23  8:11 PM  Result Value Ref Range   Lactic Acid, Venous 1.6 0.5 - 1.9 mmol/L   CBC: Recent Labs  Lab 12/06/23 1604  WBC 19.4*  HGB 14.3  HCT 40.5  MCV 84.0  PLT 200   Basic Metabolic Panel: Recent Labs  Lab 12/06/23 1604  NA 133*  K 4.0  CL 100  CO2 20*  GLUCOSE 207*  BUN 21  CREATININE 1.22*  CALCIUM 9.3   GFR: Estimated Creatinine Clearance: 36.6 mL/min (A) (by C-G formula based on SCr of 1.22 mg/dL (H)). Liver Function Tests: Recent Labs  Lab 12/06/23 1604  AST 22  ALT 20  ALKPHOS 79  BILITOT 1.7*  PROT 7.4  ALBUMIN 3.8   Recent Labs  Lab 12/06/23 1604  LIPASE 25   No results for input(s): "AMMONIA" in the last 168 hours. Coagulation Profile: No results for input(s): "INR", "PROTIME" in the last 168 hours. Cardiac Enzymes: Recent Labs  Lab 12/06/23 1606  CKTOTAL 81   BNP (last 3 results) No results for input(s): "PROBNP" in the last 8760 hours. HbA1C: No results for input(s): "HGBA1C" in the last 72 hours. CBG: No results for input(s): "GLUCAP" in the last 168 hours. Lipid Profile: No results for input(s): "CHOL", "HDL", "LDLCALC", "TRIG", "CHOLHDL", "LDLDIRECT" in the last 72 hours. Thyroid Function Tests: No results for input(s): "TSH", "T4TOTAL", "FREET4", "T3FREE", "THYROIDAB" in the last 72 hours. Anemia Panel: No results for input(s): "VITAMINB12",  "FOLATE", "FERRITIN", "TIBC", "IRON", "RETICCTPCT" in the last 72 hours. Urinalysis    Component Value Date/Time   COLORURINE YELLOW (A) 07/09/2016 1427   APPEARANCEUR CLEAR (A) 07/09/2016 1427   APPEARANCEUR Turbid 03/07/2013 0923   LABSPEC 1.019 07/09/2016 1427   LABSPEC 1.019 03/07/2013 0923   PHURINE 5.0 07/09/2016 1427   GLUCOSEU NEGATIVE 07/09/2016 1427   GLUCOSEU Negative 03/07/2013 0923   HGBUR NEGATIVE 07/09/2016 1427   BILIRUBINUR NEGATIVE 07/09/2016 1427   BILIRUBINUR Negative 03/07/2013 0923   KETONESUR NEGATIVE 07/09/2016 1427   PROTEINUR NEGATIVE 07/09/2016 1427   NITRITE NEGATIVE 07/09/2016 1427   LEUKOCYTESUR 3+ (A) 07/09/2016 1427   LEUKOCYTESUR 3+ 03/07/2013 0923      Unresulted Labs (From admission, onward)     Start     Ordered   12/06/23 2009  Lactic acid, plasma  (Lactic Acid)  Now then every 2 hours,   STAT      12/06/23 2008   12/06/23 2009  Blood culture (routine x 2)  BLOOD CULTURE X 2,   STAT      12/06/23 2008   12/06/23 2009  Gastrointestinal Panel by PCR , Stool  (Gastrointestinal Panel by PCR, Stool                                                                                                                                                     **Does Not include CLOSTRIDIUM DIFFICILE testing. **If CDIFF testing is needed, place order from the "C Difficile Testing" order set.**)  Once,   URGENT        12/06/23 2008   12/06/23 2009  C Difficile Quick Screen w PCR reflex  (C Difficile quick screen w PCR reflex panel )  Once, for 24 hours,   URGENT       References:    CDiff Information Tool   12/06/23 2008   12/06/23 1604  Urinalysis, Routine w reflex microscopic -Urine, Clean Catch  Once,   URGENT       Question:  Specimen Source  Answer:  Urine, Clean Catch   12/06/23 1605            Medications  cefTRIAXone (ROCEPHIN) 2 g in sodium chloride 0.9 % 100 mL IVPB (has no administration in time range)  azithromycin (ZITHROMAX) 500 mg in sodium  chloride 0.9 % 250 mL IVPB (has no administration in time range)  sodium chloride 0.9 % bolus 1,000 mL (0 mLs Intravenous Stopped 12/06/23 2118)  iohexol (OMNIPAQUE) 350 MG/ML injection 80 mL (80 mLs Intravenous Contrast Given 12/06/23 2108)    Radiological Exams on Admission: CT Angio Chest PE W and/or Wo Contrast  Result Date: 12/06/2023 CLINICAL DATA:  Larey Seat, generalized weakness, diarrhea, short of breath, mid back pain EXAM: CT ANGIOGRAPHY CHEST CT ABDOMEN AND PELVIS WITH CONTRAST TECHNIQUE: Multidetector CT imaging of the chest was performed using the standard protocol during bolus administration of intravenous contrast. Multiplanar CT image reconstructions and MIPs were obtained to evaluate the vascular anatomy. Multidetector CT imaging of the abdomen and pelvis was performed using the standard protocol during bolus administration of intravenous contrast. RADIATION DOSE REDUCTION: This exam was performed according to the departmental dose-optimization program which includes automated exposure control, adjustment of the mA and/or kV according to patient size and/or use of iterative reconstruction technique. CONTRAST:  80mL OMNIPAQUE IOHEXOL 350 MG/ML SOLN COMPARISON:  07/12/2020, 12/06/2023 FINDINGS: CTA CHEST FINDINGS Cardiovascular: This is a technically adequate evaluation of the pulmonary vasculature. No filling defects or pulmonary emboli. The heart is unremarkable without pericardial effusion. Stable calcification of the mitral annulus. No evidence of thoracic aortic aneurysm or dissection. Atherosclerosis of the aorta and coronary vasculature. Mediastinum/Nodes: No enlarged mediastinal, hilar, or axillary lymph nodes. Thyroid gland, trachea, and esophagus demonstrate no significant findings. Lungs/Pleura: There is an area of dense consolidation within the right upper lobe abutting the major fissure posteriorly most compatible with pneumonia. Dependent areas of consolidation within the lower lobes  favor atelectasis. No effusion or pneumothorax. Central airways are patent. Musculoskeletal: No acute or destructive bony lesions. Reconstructed images demonstrate no additional findings. Review of the MIP images confirms the above findings. CT ABDOMEN and PELVIS FINDINGS Hepatobiliary: No focal liver abnormality is seen. No gallstones, gallbladder wall thickening, or biliary dilatation. Pancreas: Unremarkable. No pancreatic ductal dilatation or surrounding inflammatory changes. Spleen: Normal in size without focal abnormality. Adrenals/Urinary Tract: Severe right renal atrophy and cortical thinning. Left kidney is unremarkable. No urinary tract calculi or obstructive uropathy. The adrenals and bladder are unremarkable. Stomach/Bowel: No bowel obstruction or ileus. Normal appendix right upper quadrant. No bowel wall thickening or inflammatory change. Vascular/Lymphatic: Aortic atherosclerosis. No enlarged abdominal or pelvic lymph nodes. Reproductive: 4.8 x 4.6 cm simple left ovarian cyst. Uterus and right ovary are unremarkable. Other: No free fluid or free intraperitoneal gas. No abdominal wall hernia. Musculoskeletal: No acute or destructive bony abnormalities. Reconstructed images demonstrate no additional findings. Review of the MIP images confirms the above findings. IMPRESSION: Chest: 1. No evidence of pulmonary embolus. 2. Posterior right upper lobe airspace disease, most consistent with bronchopneumonia. Followup PA and lateral chest X-ray is recommended in 3-4 weeks following trial of antibiotic therapy to ensure resolution and exclude underlying malignancy. 3. Aortic Atherosclerosis (ICD10-I70.0). Coronary artery atherosclerosis. Abdomen/pelvis: 1. No acute intra-abdominal or intrapelvic process. 2. 4.8 cm simple left ovarian cyst. Follow-up pelvic ultrasound in 6 months recommended. 3. Right renal atrophy. 4.  Aortic Atherosclerosis (ICD10-I70.0). Electronically Signed   By: Sharlet Salina M.D.   On:  12/06/2023 21:38   CT  ABDOMEN PELVIS W CONTRAST  Result Date: 12/06/2023 CLINICAL DATA:  Larey Seat, generalized weakness, diarrhea, short of breath, mid back pain EXAM: CT ANGIOGRAPHY CHEST CT ABDOMEN AND PELVIS WITH CONTRAST TECHNIQUE: Multidetector CT imaging of the chest was performed using the standard protocol during bolus administration of intravenous contrast. Multiplanar CT image reconstructions and MIPs were obtained to evaluate the vascular anatomy. Multidetector CT imaging of the abdomen and pelvis was performed using the standard protocol during bolus administration of intravenous contrast. RADIATION DOSE REDUCTION: This exam was performed according to the departmental dose-optimization program which includes automated exposure control, adjustment of the mA and/or kV according to patient size and/or use of iterative reconstruction technique. CONTRAST:  80mL OMNIPAQUE IOHEXOL 350 MG/ML SOLN COMPARISON:  07/12/2020, 12/06/2023 FINDINGS: CTA CHEST FINDINGS Cardiovascular: This is a technically adequate evaluation of the pulmonary vasculature. No filling defects or pulmonary emboli. The heart is unremarkable without pericardial effusion. Stable calcification of the mitral annulus. No evidence of thoracic aortic aneurysm or dissection. Atherosclerosis of the aorta and coronary vasculature. Mediastinum/Nodes: No enlarged mediastinal, hilar, or axillary lymph nodes. Thyroid gland, trachea, and esophagus demonstrate no significant findings. Lungs/Pleura: There is an area of dense consolidation within the right upper lobe abutting the major fissure posteriorly most compatible with pneumonia. Dependent areas of consolidation within the lower lobes favor atelectasis. No effusion or pneumothorax. Central airways are patent. Musculoskeletal: No acute or destructive bony lesions. Reconstructed images demonstrate no additional findings. Review of the MIP images confirms the above findings. CT ABDOMEN and PELVIS FINDINGS  Hepatobiliary: No focal liver abnormality is seen. No gallstones, gallbladder wall thickening, or biliary dilatation. Pancreas: Unremarkable. No pancreatic ductal dilatation or surrounding inflammatory changes. Spleen: Normal in size without focal abnormality. Adrenals/Urinary Tract: Severe right renal atrophy and cortical thinning. Left kidney is unremarkable. No urinary tract calculi or obstructive uropathy. The adrenals and bladder are unremarkable. Stomach/Bowel: No bowel obstruction or ileus. Normal appendix right upper quadrant. No bowel wall thickening or inflammatory change. Vascular/Lymphatic: Aortic atherosclerosis. No enlarged abdominal or pelvic lymph nodes. Reproductive: 4.8 x 4.6 cm simple left ovarian cyst. Uterus and right ovary are unremarkable. Other: No free fluid or free intraperitoneal gas. No abdominal wall hernia. Musculoskeletal: No acute or destructive bony abnormalities. Reconstructed images demonstrate no additional findings. Review of the MIP images confirms the above findings. IMPRESSION: Chest: 1. No evidence of pulmonary embolus. 2. Posterior right upper lobe airspace disease, most consistent with bronchopneumonia. Followup PA and lateral chest X-ray is recommended in 3-4 weeks following trial of antibiotic therapy to ensure resolution and exclude underlying malignancy. 3. Aortic Atherosclerosis (ICD10-I70.0). Coronary artery atherosclerosis. Abdomen/pelvis: 1. No acute intra-abdominal or intrapelvic process. 2. 4.8 cm simple left ovarian cyst. Follow-up pelvic ultrasound in 6 months recommended. 3. Right renal atrophy. 4.  Aortic Atherosclerosis (ICD10-I70.0). Electronically Signed   By: Sharlet Salina M.D.   On: 12/06/2023 21:38   DG Ribs Unilateral W/Chest Right  Result Date: 12/06/2023 CLINICAL DATA:  Right rib pain following fall, initial encounter EXAM: RIGHT RIBS AND CHEST - 3+ VIEW COMPARISON:  07/12/2020 FINDINGS: Cardiac shadow is within normal limits. Aortic  calcifications are noted. Lungs are well aerated bilaterally. No focal infiltrate, effusion pneumothorax is noted. Minimal atelectatic changes are noted in the inferior aspect of the right upper lobe along the minor fissure. No acute rib abnormality is noted. IMPRESSION: No rib fracture is noted. Mild right upper lobe atelectasis along the minor fissure. Electronically Signed   By: Alcide Clever M.D.   On:  12/06/2023 21:13   DG Thoracic Spine 2 View  Result Date: 12/06/2023 CLINICAL DATA:  Unwitnessed fall EXAM: THORACIC SPINE 2 VIEWS COMPARISON:  Chest CT 07/12/2020 FINDINGS: Scoliosis. Vertebral body heights are maintained. Mild multilevel degenerative osteophytes. IMPRESSION: Scoliosis and mild degenerative change. Electronically Signed   By: Jasmine Pang M.D.   On: 12/06/2023 17:36   CT Head Wo Contrast  Result Date: 12/06/2023 CLINICAL DATA:  Head trauma, minor (Age >= 65y); Neck trauma (Age >= 65y) EXAM: CT HEAD WITHOUT CONTRAST CT CERVICAL SPINE WITHOUT CONTRAST TECHNIQUE: Multidetector CT imaging of the head and cervical spine was performed following the standard protocol without intravenous contrast. Multiplanar CT image reconstructions of the cervical spine were also generated. RADIATION DOSE REDUCTION: This exam was performed according to the departmental dose-optimization program which includes automated exposure control, adjustment of the mA and/or kV according to patient size and/or use of iterative reconstruction technique. COMPARISON:  CTA chest 07/01/2020 FINDINGS: CT HEAD FINDINGS Brain: No evidence of acute infarction, hemorrhage, hydrocephalus, extra-axial collection or mass lesion/mass effect. Vascular: No hyperdense vessel or unexpected calcification. Skull: Normal. Negative for fracture or focal lesion. Sinuses/Orbits: No middle ear or mastoid effusion. Paranasal sinuses are clear. Orbits are unremarkable. Other: None. CT CERVICAL SPINE FINDINGS Alignment: Grade 1 anterolisthesis of C3  on C4 and C4 on C5 Skull base and vertebrae: No acute fracture. No primary bone lesion or focal pathologic process. Soft tissues and spinal canal: No prevertebral fluid or swelling. No visible canal hematoma. Disc levels:  No CT evidence of high-grade spinal canal stenosis Upper chest: New ground-glass nodule in the left upper lobe (series 2, image 67) measuring to 1.2 cm. Other: None IMPRESSION: 1. No acute intracranial abnormality. 2. No acute fracture or traumatic subluxation of the cervical spine. 3. New ground-glass nodule in the left upper lobe measuring to 1.2 cm. Recommend follow up chest CT in 6 months. Electronically Signed   By: Lorenza Cambridge M.D.   On: 12/06/2023 17:26   CT Cervical Spine Wo Contrast  Result Date: 12/06/2023 CLINICAL DATA:  Head trauma, minor (Age >= 65y); Neck trauma (Age >= 65y) EXAM: CT HEAD WITHOUT CONTRAST CT CERVICAL SPINE WITHOUT CONTRAST TECHNIQUE: Multidetector CT imaging of the head and cervical spine was performed following the standard protocol without intravenous contrast. Multiplanar CT image reconstructions of the cervical spine were also generated. RADIATION DOSE REDUCTION: This exam was performed according to the departmental dose-optimization program which includes automated exposure control, adjustment of the mA and/or kV according to patient size and/or use of iterative reconstruction technique. COMPARISON:  CTA chest 07/01/2020 FINDINGS: CT HEAD FINDINGS Brain: No evidence of acute infarction, hemorrhage, hydrocephalus, extra-axial collection or mass lesion/mass effect. Vascular: No hyperdense vessel or unexpected calcification. Skull: Normal. Negative for fracture or focal lesion. Sinuses/Orbits: No middle ear or mastoid effusion. Paranasal sinuses are clear. Orbits are unremarkable. Other: None. CT CERVICAL SPINE FINDINGS Alignment: Grade 1 anterolisthesis of C3 on C4 and C4 on C5 Skull base and vertebrae: No acute fracture. No primary bone lesion or focal  pathologic process. Soft tissues and spinal canal: No prevertebral fluid or swelling. No visible canal hematoma. Disc levels:  No CT evidence of high-grade spinal canal stenosis Upper chest: New ground-glass nodule in the left upper lobe (series 2, image 67) measuring to 1.2 cm. Other: None IMPRESSION: 1. No acute intracranial abnormality. 2. No acute fracture or traumatic subluxation of the cervical spine. 3. New ground-glass nodule in the left upper lobe measuring to 1.2 cm.  Recommend follow up chest CT in 6 months. Electronically Signed   By: Lorenza Cambridge M.D.   On: 12/06/2023 17:26     Data Reviewed: Relevant notes from primary care and specialist visits, past discharge summaries as available in EHR, including Care Everywhere. Prior diagnostic testing as pertinent to current admission diagnoses Updated medications and problem lists for reconciliation ED course, including vitals, labs, imaging, treatment and response to treatment Triage notes, nursing and pharmacy notes and ED provider's notes Notable results as noted in HPI  Assessment and Plan: No notes have been filed under this hospital service. Service: Hospitalist       DVT prophylaxis:  Heparin   Consults:  None   Advance Care Planning:    Code Status: Prior   Family Communication:  None   Disposition Plan:  Home   Severity of Illness: The appropriate patient status for this patient is INPATIENT. Inpatient status is judged to be reasonable and necessary in order to provide the required intensity of service to ensure the patient's safety. The patient's presenting symptoms, physical exam findings, and initial radiographic and laboratory data in the context of their chronic comorbidities is felt to place them at high risk for further clinical deterioration. Furthermore, it is not anticipated that the patient will be medically stable for discharge from the hospital within 2 midnights of admission.   * I certify that at  the point of admission it is my clinical judgment that the patient will require inpatient hospital care spanning beyond 2 midnights from the point of admission due to high intensity of service, high risk for further deterioration and high frequency of surveillance required.*  Author: Gertha Calkin, MD 12/06/2023 9:58 PM  For on call review [email or web address].

## 2023-12-06 NOTE — Progress Notes (Signed)
CODE SEPSIS - PHARMACY COMMUNICATION  **Broad Spectrum Antibiotics should be administered within 1 hour of Sepsis diagnosis**  Time Code Sepsis Called/Page Received:  12/6 @ 2151  Antibiotics Ordered: Ceftriaxone, Azithromycin  Time of 1st antibiotic administration: Ceftriaxone 2 gm IV X 1 on 12/6 @ 2210  Additional action taken by pharmacy:   If necessary, Name of Provider/Nurse Contacted:     Nevaeha Finerty D ,PharmD Clinical Pharmacist  12/06/2023  10:29 PM

## 2023-12-06 NOTE — ED Provider Notes (Signed)
[facility] Provider Note    Event Date/Time   First MD Initiated Contact with Patient 12/06/23 1938     (approximate)   History   Fall   HPI  Erika Nelson is a 82 y.o. female who comes in with an unwitnessed fall where she was down on the ground for about an hour.  Patient reports having diarrhea.  She reports of increasing shortness of breath over the past few days.  She reports that after the fall she started having some right-sided rib pain.  They are concerned that she is so weak that she is unable to stand up off the commode and that they left her at home by herself for about 2 hours and that she was so weak she could not stand up and therefore fell down onto the ground.  She actually denies any back pain.  She reports is all more right rib pain.   Physical Exam   Triage Vital Signs: ED Triage Vitals  Encounter Vitals Group     BP 12/06/23 1559 (!) 145/101     Systolic BP Percentile --      Diastolic BP Percentile --      Pulse Rate 12/06/23 1559 100     Resp 12/06/23 1559 (!) 22     Temp 12/06/23 1559 98.5 F (36.9 C)     Temp Source 12/06/23 1559 Oral     SpO2 12/06/23 1559 92 %     Weight 12/06/23 1601 165 lb (74.8 kg)     Height 12/06/23 1601 5\' 5"  (1.651 m)     Head Circumference --      Peak Flow --      Pain Score 12/06/23 1601 6     Pain Loc --      Pain Education --      Exclude from Growth Chart --     Most recent vital signs: Vitals:   12/06/23 1559 12/06/23 1946  BP: (!) 145/101 135/76  Pulse: 100 78  Resp: (!) 22 18  Temp: 98.5 F (36.9 C)   SpO2: 92% 95%     General: Awake, no distress.  CV:  Good peripheral perfusion.  Resp:  Normal effort.  Abd:  No distention.  Tender in the right upper abdomen with some right chest wall tenderness. Other:  No CTL spine tenderness.   ED Results / Procedures / Treatments   Labs (all labs ordered are listed, but only abnormal results are displayed) Labs Reviewed   COMPREHENSIVE METABOLIC PANEL - Abnormal; Notable for the following components:      Result Value   Sodium 133 (*)    CO2 20 (*)    Glucose, Bld 207 (*)    Creatinine, Ser 1.22 (*)    Total Bilirubin 1.7 (*)    GFR, Estimated 45 (*)    All other components within normal limits  CBC - Abnormal; Notable for the following components:   WBC 19.4 (*)    All other components within normal limits  LIPASE, BLOOD  URINALYSIS, ROUTINE W REFLEX MICROSCOPIC  CK     EKG  My interpretation of EKG:  Atrial fibrillation with a rate of 113 without any ST elevation or T wave inversions, normal intervals  RADIOLOGY I have reviewed the xray personally and interpreted no obvious fracture.  PROCEDURES:  Critical Care performed: Yes, see critical care procedure note(s)  .Critical Care  Performed by: Concha Se, MD Authorized by: Concha Se, MD  Critical care provider statement:    Critical care time (minutes):  30   Critical care was necessary to treat or prevent imminent or life-threatening deterioration of the following conditions:  Sepsis   Critical care was time spent personally by me on the following activities:  Development of treatment plan with patient or surrogate, discussions with consultants, evaluation of patient's response to treatment, examination of patient, ordering and review of laboratory studies, ordering and review of radiographic studies, ordering and performing treatments and interventions, pulse oximetry, re-evaluation of patient's condition and review of old charts    MEDICATIONS ORDERED IN ED: Medications  sodium chloride 0.9 % bolus 1,000 mL (0 mLs Intravenous Stopped 12/06/23 2118)  iohexol (OMNIPAQUE) 350 MG/ML injection 80 mL (80 mLs Intravenous Contrast Given 12/06/23 2108)     IMPRESSION / MDM / ASSESSMENT AND PLAN / ED COURSE  I reviewed the triage vital signs and the nursing notes.   Patient's presentation is most consistent with acute  presentation with potential threat to life or bodily function.    Patient comes in with a elevated white count, fever, weakness.  Imaging ordered evaluate for intracranial hemorrhage, cervical fracture.  D-dimer ordered evaluate for PE.  D-dimer was elevated so I did proceed with CT imaging and will also get a CT abdomen given a little bit of right upper abdominal pain.  She has CTs noted as below.  Concerning for potential pneumonia.  Patient is has an elevated white count and respiratory rate over 20 therefore meets sepsis criteria.  Blood cultures, lactate were ordered.  Lactate was normal and patient's vitals are stable we will hold off on full fluid resuscitation to prevent fluid overload given patient's age however will start patient on antibiotics.  Stool studies are still pending.  Will discuss with hospital team for admission given patient's age, weakness, pneumonia  1. No evidence of pulmonary embolus. 2. Posterior right upper lobe airspace disease, most consistent with bronchopneumonia. Followup PA and lateral chest X-ray is recommended in 3-4 weeks following trial of antibiotic therapy to ensure resolution and exclude underlying malignancy. 3. Aortic Atherosclerosis (ICD10-I70.0). Coronary artery atherosclerosis.   Abdomen/pelvis:   1. No acute intra-abdominal or intrapelvic process. 2. 4.8 cm simple left ovarian cyst. Follow-up pelvic ultrasound in 6 months recommended. 3. Right renal atrophy. 4.  Aortic Atherosclerosis (ICD10-I70.0).    The patient is on the cardiac monitor to evaluate for evidence of arrhythmia and/or significant heart rate changes.      FINAL CLINICAL IMPRESSION(S) / ED DIAGNOSES   Final diagnoses:  Fall, initial encounter  Sepsis, due to unspecified organism, unspecified whether acute organ dysfunction present (HCC)  Pneumonia due to infectious organism, unspecified laterality, unspecified part of lung  Atrial fibrillation, unspecified type (HCC)      Rx / DC Orders   ED Discharge Orders     None        Note:  This document was prepared using Dragon voice recognition software and may include unintentional dictation errors.   Concha Se, MD 12/06/23 2151

## 2023-12-06 NOTE — H&P (Signed)
History and Physical    Patient: Erika Nelson WGN:[id number] DOB: August 27, 1941 DOA: 12/06/2023 DOS: the patient was seen and examined on 12/06/2023 PCP: Danella Penton, MD  Patient coming from: Home  Chief Complaint:  Chief Complaint  Patient presents with  . Fall    HPI: Erika Nelson is a 82 y.o. female with medical history significant for allergy to albuterol, Norco, Prozac, sulfa, tramadol, GERD, hypertension, obstructive sleep apnea presenting today for generalized weakness and diarrhea the patient has noted since Thanksgiving patient was getting off the commode and was dizzy she fell and was in the floor for about an hour.  Admission requested for patient is elderly as well and found to be septic from pneumonia.   In emergency room vitals trend shows: Vitals:   12/06/23 1559 12/06/23 1601 12/06/23 1946 12/06/23 2128  BP: (!) 145/101  135/76 (!) 134/59  Pulse: 100  78 79  Temp: 98.5 F (36.9 C)     Resp: (!) 22  18 20   Height:  5\' 5"  (1.651 m)    Weight:  74.8 kg    SpO2: 92%  95% 95%  TempSrc: Oral     BMI (Calculated):  27.46    At bedside patient is alert awake afebrile O2 sats of 95% on room air. Labs are notable for: -EKG showing A-fib RVR at 113 without ST-T wave changes. - CMP shows chronic kidney disease stage IIIa, bicarb of 20, glucose 207, normal anion gap, sodium 133, total bili 1.7, troponin normal at 16, CPK 81, lactic acid of 1.6,. CBC showing leukocytosis of 19.4 otherwise normal differential. Elevated D-dimer at 0.87 and patient found to have negative CT angio chest of pulmonary embolism but left lung pneumonia and upper lobe density that needs to be reevaluated and followed on outpatient basis In the ED pt received: Medications  cefTRIAXone (ROCEPHIN) 2 g in sodium chloride 0.9 % 100 mL IVPB (has no administration in time range)  azithromycin (ZITHROMAX) 500 mg in sodium chloride 0.9 % 250 mL IVPB (has no administration in time range)  sodium chloride 0.9  % bolus 1,000 mL (0 mLs Intravenous Stopped 12/06/23 2118)  iohexol (OMNIPAQUE) 350 MG/ML injection 80 mL (80 mLs Intravenous Contrast Given 12/06/23 2108)   Review of Systems  Respiratory:  Positive for cough.   Gastrointestinal:  Positive for diarrhea and nausea.  Musculoskeletal:  Positive for falls.   Past Medical History:  Diagnosis Date  . Anxiety   . Cervicalgia   . Diverticulosis   . Generalized OA    Osteoarthritis bilateral knees  . GERD (gastroesophageal reflux disease)   . Headache   . Hyperlipidemia   . Hypertension   . Occasional tremors    Past Surgical History:  Procedure Laterality Date  . bilateral foot surgery Bilateral   . COLONOSCOPY WITH PROPOFOL N/A 03/12/2016   Procedure: COLONOSCOPY WITH PROPOFOL;  Surgeon: Christena Deem, MD;  Location: [facility] ENDOSCOPY;  Service: Endoscopy;  Laterality: N/A;  . KNEE ARTHROSCOPY Left   . TOTAL KNEE ARTHROPLASTY Right 07/26/2016   Procedure: TOTAL KNEE ARTHROPLASTY;  Surgeon: Kennedy Bucker, MD;  Location: [facility] ORS;  Service: Orthopedics;  Laterality: Right;    reports that she has never smoked. She has never used smokeless tobacco. She reports that she does not drink alcohol and does not use drugs.  Allergies  Allergen Reactions  . Albuterol   . Norco [Hydrocodone-Acetaminophen] Other (See Comments)    "shaky"  . Prozac [Fluoxetine Hcl] Other (See Comments)    "  shaky"  . Sulfa Antibiotics Rash  . Tramadol Other (See Comments)    Sleepy    Family History  Problem Relation Age of Onset  . Colon cancer Father   . Cirrhosis Mother   . Breast cancer Paternal Grandmother   . Stroke Brother   . Breast cancer Paternal Aunt     Prior to Admission medications   Medication Sig Start Date End Date Taking? Authorizing Provider  budesonide (PULMICORT) 0.5 MG/2ML nebulizer solution Take 0.5 mg by nebulization 2 (two) times daily.    [provider]  Fluticasone-Umeclidin-Vilant (TRELEGY ELLIPTA) 100-62.5-25  MCG/INH AEPB Inhale 1 puff into the lungs daily at 6 (six) AM.    [provider]  glipiZIDE (GLUCOTROL XL) 5 MG 24 hr tablet Take 5 mg by mouth daily. 06/16/20   [provider]  levothyroxine (SYNTHROID, LEVOTHROID) 75 MCG tablet Take 75 mcg by mouth daily before breakfast.    [provider]  metFORMIN (GLUCOPHAGE-XR) 500 MG 24 hr tablet Take 500 mg by mouth 2 (two) times daily. 05/11/20   [provider]  nystatin cream (MYCOSTATIN) Apply 1 application topically 2 (two) times daily.    [provider]  olmesartan ([name]) 20 MG tablet Take 20 mg by mouth daily. 06/16/20   [provider]  omeprazole (PRILOSEC) 40 MG capsule Take 40 mg by mouth daily. 06/16/20   [provider]  simvastatin (ZOCOR) 20 MG tablet Take 20 mg by mouth at bedtime. 06/16/20   [provider]  triamterene-hydrochlorothiazide (MAXZIDE-25) 37.5-25 MG tablet Take 1 tablet by mouth daily. 03/18/20   [provider]   Vitals:   12/06/23 1559 12/06/23 1601 12/06/23 1946 12/06/23 2128  BP: (!) 145/101  135/76 (!) 134/59  Pulse: 100  78 79  Resp: (!) 22  18 20   Temp: 98.5 F (36.9 C)     TempSrc: Oral     SpO2: 92%  95% 95%  Weight:  74.8 kg    Height:  5\' 5"  (1.651 m)     Physical Exam Vitals and nursing note reviewed.  Constitutional:      General: She is not in acute distress. HENT:     Head: Normocephalic and atraumatic.     Right Ear: Hearing normal. There is no impacted cerumen.     Left Ear: Hearing normal.     Nose: Nose normal. No nasal deformity.     Mouth/Throat:     Lips: Pink.     Tongue: No lesions.     Pharynx: Oropharynx is clear.  Eyes:     General: Lids are normal.     Extraocular Movements: Extraocular movements intact.  Cardiovascular:     Rate and Rhythm: Tachycardia present. Rhythm irregular.     Pulses: Normal pulses.     Heart sounds: Normal heart sounds.  Pulmonary:     Effort: Pulmonary effort is  normal.     Breath sounds: Normal breath sounds.  Abdominal:     General: Bowel sounds are normal. There is no distension.     Palpations: Abdomen is soft. There is no mass.     Tenderness: There is no abdominal tenderness.  Musculoskeletal:     Right lower leg: No edema.     Left lower leg: No edema.  Skin:    General: Skin is warm.  Neurological:     General: No focal deficit present.     Mental Status: She is alert and oriented to person, place, and time.  Cranial Nerves: Cranial nerves 2-12 are intact.  Psychiatric:        Attention and Perception: Attention normal.        Mood and Affect: Mood normal.        Speech: Speech normal.        Behavior: Behavior normal. Behavior is cooperative.      Labs on Admission: I have personally reviewed following labs and imaging studies Results for orders placed or performed during the hospital encounter of 12/06/23 (from the past 24 hour(s))  Lipase, blood     Status: None   Collection Time: 12/06/23  4:04 PM  Result Value Ref Range   Lipase 25 11 - 51 U/L  Comprehensive metabolic panel     Status: Abnormal   Collection Time: 12/06/23  4:04 PM  Result Value Ref Range   Sodium 133 (L) 135 - 145 mmol/L   Potassium 4.0 3.5 - 5.1 mmol/L   Chloride 100 98 - 111 mmol/L   CO2 20 (L) 22 - 32 mmol/L   Glucose, Bld 207 (H) 70 - 99 mg/dL   BUN 21 8 - 23 mg/dL   Creatinine, Ser 6.96 (H) 0.44 - 1.00 mg/dL   Calcium 9.3 8.9 - 29.5 mg/dL   Total Protein 7.4 6.5 - 8.1 g/dL   Albumin 3.8 3.5 - 5.0 g/dL   AST 22 15 - 41 U/L   ALT 20 0 - 44 U/L   Alkaline Phosphatase 79 38 - 126 U/L   Total Bilirubin 1.7 (H) <1.2 mg/dL   GFR, Estimated 45 (L) >60 mL/min   Anion gap 13 5 - 15  CBC     Status: Abnormal   Collection Time: 12/06/23  4:04 PM  Result Value Ref Range   WBC 19.4 (H) 4.0 - 10.5 K/uL   RBC 4.82 3.87 - 5.11 MIL/uL   Hemoglobin 14.3 12.0 - 15.0 g/dL   HCT 28.4 13.2 - 44.0 %   MCV 84.0 80.0 - 100.0 fL   MCH 29.7 26.0 - 34.0 pg    MCHC 35.3 30.0 - 36.0 g/dL   RDW 10.2 72.5 - 36.6 %   Platelets 200 150 - 400 K/uL   nRBC 0.0 0.0 - 0.2 %  CK     Status: None   Collection Time: 12/06/23  4:06 PM  Result Value Ref Range   Total CK 81 38 - 234 U/L  Troponin I (High Sensitivity)     Status: None   Collection Time: 12/06/23  4:06 PM  Result Value Ref Range   Troponin I (High Sensitivity) 16 <18 ng/L  D-dimer, quantitative     Status: Abnormal   Collection Time: 12/06/23  8:11 PM  Result Value Ref Range   D-Dimer, Quant 0.87 (H) 0.00 - 0.50 ug/mL-FEU  Lactic acid, plasma     Status: None   Collection Time: 12/06/23  8:11 PM  Result Value Ref Range   Lactic Acid, Venous 1.6 0.5 - 1.9 mmol/L   CBC: Recent Labs  Lab 12/06/23 1604  WBC 19.4*  HGB 14.3  HCT 40.5  MCV 84.0  PLT 200   Basic Metabolic Panel: Recent Labs  Lab 12/06/23 1604  NA 133*  K 4.0  CL 100  CO2 20*  GLUCOSE 207*  BUN 21  CREATININE 1.22*  CALCIUM 9.3   GFR: Estimated Creatinine Clearance: 36.6 mL/min (A) (by C-G formula based on SCr of 1.22 mg/dL (H)). Liver Function Tests: Recent Labs  Lab 12/06/23 1604  AST 22  ALT 20  ALKPHOS 79  BILITOT 1.7*  PROT 7.4  ALBUMIN 3.8   Recent Labs  Lab 12/06/23 1604  LIPASE 25   No results for input(s): "AMMONIA" in the last 168 hours. Coagulation Profile: No results for input(s): "INR", "PROTIME" in the last 168 hours. Cardiac Enzymes: Recent Labs  Lab 12/06/23 1606  CKTOTAL 81   BNP (last 3 results) No results for input(s): "PROBNP" in the last 8760 hours. HbA1C: No results for input(s): "HGBA1C" in the last 72 hours. CBG: No results for input(s): "GLUCAP" in the last 168 hours. Lipid Profile: No results for input(s): "CHOL", "HDL", "LDLCALC", "TRIG", "CHOLHDL", "LDLDIRECT" in the last 72 hours. Thyroid Function Tests: No results for input(s): "TSH", "T4TOTAL", "FREET4", "T3FREE", "THYROIDAB" in the last 72 hours. Anemia Panel: No results for input(s): "VITAMINB12",  "FOLATE", "FERRITIN", "TIBC", "IRON", "RETICCTPCT" in the last 72 hours. Urinalysis    Component Value Date/Time   COLORURINE YELLOW (A) 07/09/2016 1427   APPEARANCEUR CLEAR (A) 07/09/2016 1427   APPEARANCEUR Turbid 03/07/2013 0923   LABSPEC 1.019 07/09/2016 1427   LABSPEC 1.019 03/07/2013 0923   PHURINE 5.0 07/09/2016 1427   GLUCOSEU NEGATIVE 07/09/2016 1427   GLUCOSEU Negative 03/07/2013 0923   HGBUR NEGATIVE 07/09/2016 1427   BILIRUBINUR NEGATIVE 07/09/2016 1427   BILIRUBINUR Negative 03/07/2013 0923   KETONESUR NEGATIVE 07/09/2016 1427   PROTEINUR NEGATIVE 07/09/2016 1427   NITRITE NEGATIVE 07/09/2016 1427   LEUKOCYTESUR 3+ (A) 07/09/2016 1427   LEUKOCYTESUR 3+ 03/07/2013 0923      Unresulted Labs (From admission, onward)     Start     Ordered   12/06/23 2009  Lactic acid, plasma  (Lactic Acid)  Now then every 2 hours,   STAT      12/06/23 2008   12/06/23 2009  Blood culture (routine x 2)  BLOOD CULTURE X 2,   STAT      12/06/23 2008   12/06/23 2009  Gastrointestinal Panel by PCR , Stool  (Gastrointestinal Panel by PCR, Stool                                                                                                                                                     **Does Not include CLOSTRIDIUM DIFFICILE testing. **If CDIFF testing is needed, place order from the "C Difficile Testing" order set.**)  Once,   URGENT        12/06/23 2008   12/06/23 2009  C Difficile Quick Screen w PCR reflex  (C Difficile quick screen w PCR reflex panel )  Once, for 24 hours,   URGENT       References:    CDiff Information Tool   12/06/23 2008   12/06/23 1604  Urinalysis, Routine w reflex microscopic -Urine, Clean Catch  Once,   URGENT       Question:  Specimen Source  Answer:  Urine, Clean Catch   12/06/23 1605            Medications  cefTRIAXone (ROCEPHIN) 2 g in sodium chloride 0.9 % 100 mL IVPB (has no administration in time range)  azithromycin (ZITHROMAX) 500 mg in sodium  chloride 0.9 % 250 mL IVPB (has no administration in time range)  sodium chloride 0.9 % bolus 1,000 mL (0 mLs Intravenous Stopped 12/06/23 2118)  iohexol (OMNIPAQUE) 350 MG/ML injection 80 mL (80 mLs Intravenous Contrast Given 12/06/23 2108)    Radiological Exams on Admission: CT Angio Chest PE W and/or Wo Contrast  Result Date: 12/06/2023 CLINICAL DATA:  Larey Seat, generalized weakness, diarrhea, short of breath, mid back pain EXAM: CT ANGIOGRAPHY CHEST CT ABDOMEN AND PELVIS WITH CONTRAST TECHNIQUE: Multidetector CT imaging of the chest was performed using the standard protocol during bolus administration of intravenous contrast. Multiplanar CT image reconstructions and MIPs were obtained to evaluate the vascular anatomy. Multidetector CT imaging of the abdomen and pelvis was performed using the standard protocol during bolus administration of intravenous contrast. RADIATION DOSE REDUCTION: This exam was performed according to the departmental dose-optimization program which includes automated exposure control, adjustment of the mA and/or kV according to patient size and/or use of iterative reconstruction technique. CONTRAST:  80mL OMNIPAQUE IOHEXOL 350 MG/ML SOLN COMPARISON:  07/12/2020, 12/06/2023 FINDINGS: CTA CHEST FINDINGS Cardiovascular: This is a technically adequate evaluation of the pulmonary vasculature. No filling defects or pulmonary emboli. The heart is unremarkable without pericardial effusion. Stable calcification of the mitral annulus. No evidence of thoracic aortic aneurysm or dissection. Atherosclerosis of the aorta and coronary vasculature. Mediastinum/Nodes: No enlarged mediastinal, hilar, or axillary lymph nodes. Thyroid gland, trachea, and esophagus demonstrate no significant findings. Lungs/Pleura: There is an area of dense consolidation within the right upper lobe abutting the major fissure posteriorly most compatible with pneumonia. Dependent areas of consolidation within the lower lobes  favor atelectasis. No effusion or pneumothorax. Central airways are patent. Musculoskeletal: No acute or destructive bony lesions. Reconstructed images demonstrate no additional findings. Review of the MIP images confirms the above findings. CT ABDOMEN and PELVIS FINDINGS Hepatobiliary: No focal liver abnormality is seen. No gallstones, gallbladder wall thickening, or biliary dilatation. Pancreas: Unremarkable. No pancreatic ductal dilatation or surrounding inflammatory changes. Spleen: Normal in size without focal abnormality. Adrenals/Urinary Tract: Severe right renal atrophy and cortical thinning. Left kidney is unremarkable. No urinary tract calculi or obstructive uropathy. The adrenals and bladder are unremarkable. Stomach/Bowel: No bowel obstruction or ileus. Normal appendix right upper quadrant. No bowel wall thickening or inflammatory change. Vascular/Lymphatic: Aortic atherosclerosis. No enlarged abdominal or pelvic lymph nodes. Reproductive: 4.8 x 4.6 cm simple left ovarian cyst. Uterus and right ovary are unremarkable. Other: No free fluid or free intraperitoneal gas. No abdominal wall hernia. Musculoskeletal: No acute or destructive bony abnormalities. Reconstructed images demonstrate no additional findings. Review of the MIP images confirms the above findings. IMPRESSION: Chest: 1. No evidence of pulmonary embolus. 2. Posterior right upper lobe airspace disease, most consistent with bronchopneumonia. Followup PA and lateral chest X-ray is recommended in 3-4 weeks following trial of antibiotic therapy to ensure resolution and exclude underlying malignancy. 3. Aortic Atherosclerosis (ICD10-I70.0). Coronary artery atherosclerosis. Abdomen/pelvis: 1. No acute intra-abdominal or intrapelvic process. 2. 4.8 cm simple left ovarian cyst. Follow-up pelvic ultrasound in 6 months recommended. 3. Right renal atrophy. 4.  Aortic Atherosclerosis (ICD10-I70.0). Electronically Signed   By: Sharlet Salina M.D.   On:  12/06/2023 21:38   CT  ABDOMEN PELVIS W CONTRAST  Result Date: 12/06/2023 CLINICAL DATA:  Larey Seat, generalized weakness, diarrhea, short of breath, mid back pain EXAM: CT ANGIOGRAPHY CHEST CT ABDOMEN AND PELVIS WITH CONTRAST TECHNIQUE: Multidetector CT imaging of the chest was performed using the standard protocol during bolus administration of intravenous contrast. Multiplanar CT image reconstructions and MIPs were obtained to evaluate the vascular anatomy. Multidetector CT imaging of the abdomen and pelvis was performed using the standard protocol during bolus administration of intravenous contrast. RADIATION DOSE REDUCTION: This exam was performed according to the departmental dose-optimization program which includes automated exposure control, adjustment of the mA and/or kV according to patient size and/or use of iterative reconstruction technique. CONTRAST:  80mL OMNIPAQUE IOHEXOL 350 MG/ML SOLN COMPARISON:  07/12/2020, 12/06/2023 FINDINGS: CTA CHEST FINDINGS Cardiovascular: This is a technically adequate evaluation of the pulmonary vasculature. No filling defects or pulmonary emboli. The heart is unremarkable without pericardial effusion. Stable calcification of the mitral annulus. No evidence of thoracic aortic aneurysm or dissection. Atherosclerosis of the aorta and coronary vasculature. Mediastinum/Nodes: No enlarged mediastinal, hilar, or axillary lymph nodes. Thyroid gland, trachea, and esophagus demonstrate no significant findings. Lungs/Pleura: There is an area of dense consolidation within the right upper lobe abutting the major fissure posteriorly most compatible with pneumonia. Dependent areas of consolidation within the lower lobes favor atelectasis. No effusion or pneumothorax. Central airways are patent. Musculoskeletal: No acute or destructive bony lesions. Reconstructed images demonstrate no additional findings. Review of the MIP images confirms the above findings. CT ABDOMEN and PELVIS FINDINGS  Hepatobiliary: No focal liver abnormality is seen. No gallstones, gallbladder wall thickening, or biliary dilatation. Pancreas: Unremarkable. No pancreatic ductal dilatation or surrounding inflammatory changes. Spleen: Normal in size without focal abnormality. Adrenals/Urinary Tract: Severe right renal atrophy and cortical thinning. Left kidney is unremarkable. No urinary tract calculi or obstructive uropathy. The adrenals and bladder are unremarkable. Stomach/Bowel: No bowel obstruction or ileus. Normal appendix right upper quadrant. No bowel wall thickening or inflammatory change. Vascular/Lymphatic: Aortic atherosclerosis. No enlarged abdominal or pelvic lymph nodes. Reproductive: 4.8 x 4.6 cm simple left ovarian cyst. Uterus and right ovary are unremarkable. Other: No free fluid or free intraperitoneal gas. No abdominal wall hernia. Musculoskeletal: No acute or destructive bony abnormalities. Reconstructed images demonstrate no additional findings. Review of the MIP images confirms the above findings. IMPRESSION: Chest: 1. No evidence of pulmonary embolus. 2. Posterior right upper lobe airspace disease, most consistent with bronchopneumonia. Followup PA and lateral chest X-ray is recommended in 3-4 weeks following trial of antibiotic therapy to ensure resolution and exclude underlying malignancy. 3. Aortic Atherosclerosis (ICD10-I70.0). Coronary artery atherosclerosis. Abdomen/pelvis: 1. No acute intra-abdominal or intrapelvic process. 2. 4.8 cm simple left ovarian cyst. Follow-up pelvic ultrasound in 6 months recommended. 3. Right renal atrophy. 4.  Aortic Atherosclerosis (ICD10-I70.0). Electronically Signed   By: Sharlet Salina M.D.   On: 12/06/2023 21:38   DG Ribs Unilateral W/Chest Right  Result Date: 12/06/2023 CLINICAL DATA:  Right rib pain following fall, initial encounter EXAM: RIGHT RIBS AND CHEST - 3+ VIEW COMPARISON:  07/12/2020 FINDINGS: Cardiac shadow is within normal limits. Aortic  calcifications are noted. Lungs are well aerated bilaterally. No focal infiltrate, effusion pneumothorax is noted. Minimal atelectatic changes are noted in the inferior aspect of the right upper lobe along the minor fissure. No acute rib abnormality is noted. IMPRESSION: No rib fracture is noted. Mild right upper lobe atelectasis along the minor fissure. Electronically Signed   By: Alcide Clever M.D.   On:  12/06/2023 21:13   DG Thoracic Spine 2 View  Result Date: 12/06/2023 CLINICAL DATA:  Unwitnessed fall EXAM: THORACIC SPINE 2 VIEWS COMPARISON:  Chest CT 07/12/2020 FINDINGS: Scoliosis. Vertebral body heights are maintained. Mild multilevel degenerative osteophytes. IMPRESSION: Scoliosis and mild degenerative change. Electronically Signed   By: Jasmine Pang M.D.   On: 12/06/2023 17:36   CT Head Wo Contrast  Result Date: 12/06/2023 CLINICAL DATA:  Head trauma, minor (Age >= 65y); Neck trauma (Age >= 65y) EXAM: CT HEAD WITHOUT CONTRAST CT CERVICAL SPINE WITHOUT CONTRAST TECHNIQUE: Multidetector CT imaging of the head and cervical spine was performed following the standard protocol without intravenous contrast. Multiplanar CT image reconstructions of the cervical spine were also generated. RADIATION DOSE REDUCTION: This exam was performed according to the departmental dose-optimization program which includes automated exposure control, adjustment of the mA and/or kV according to patient size and/or use of iterative reconstruction technique. COMPARISON:  CTA chest 07/01/2020 FINDINGS: CT HEAD FINDINGS Brain: No evidence of acute infarction, hemorrhage, hydrocephalus, extra-axial collection or mass lesion/mass effect. Vascular: No hyperdense vessel or unexpected calcification. Skull: Normal. Negative for fracture or focal lesion. Sinuses/Orbits: No middle ear or mastoid effusion. Paranasal sinuses are clear. Orbits are unremarkable. Other: None. CT CERVICAL SPINE FINDINGS Alignment: Grade 1 anterolisthesis of C3  on C4 and C4 on C5 Skull base and vertebrae: No acute fracture. No primary bone lesion or focal pathologic process. Soft tissues and spinal canal: No prevertebral fluid or swelling. No visible canal hematoma. Disc levels:  No CT evidence of high-grade spinal canal stenosis Upper chest: New ground-glass nodule in the left upper lobe (series 2, image 67) measuring to 1.2 cm. Other: None IMPRESSION: 1. No acute intracranial abnormality. 2. No acute fracture or traumatic subluxation of the cervical spine. 3. New ground-glass nodule in the left upper lobe measuring to 1.2 cm. Recommend follow up chest CT in 6 months. Electronically Signed   By: Lorenza Cambridge M.D.   On: 12/06/2023 17:26   CT Cervical Spine Wo Contrast  Result Date: 12/06/2023 CLINICAL DATA:  Head trauma, minor (Age >= 65y); Neck trauma (Age >= 65y) EXAM: CT HEAD WITHOUT CONTRAST CT CERVICAL SPINE WITHOUT CONTRAST TECHNIQUE: Multidetector CT imaging of the head and cervical spine was performed following the standard protocol without intravenous contrast. Multiplanar CT image reconstructions of the cervical spine were also generated. RADIATION DOSE REDUCTION: This exam was performed according to the departmental dose-optimization program which includes automated exposure control, adjustment of the mA and/or kV according to patient size and/or use of iterative reconstruction technique. COMPARISON:  CTA chest 07/01/2020 FINDINGS: CT HEAD FINDINGS Brain: No evidence of acute infarction, hemorrhage, hydrocephalus, extra-axial collection or mass lesion/mass effect. Vascular: No hyperdense vessel or unexpected calcification. Skull: Normal. Negative for fracture or focal lesion. Sinuses/Orbits: No middle ear or mastoid effusion. Paranasal sinuses are clear. Orbits are unremarkable. Other: None. CT CERVICAL SPINE FINDINGS Alignment: Grade 1 anterolisthesis of C3 on C4 and C4 on C5 Skull base and vertebrae: No acute fracture. No primary bone lesion or focal  pathologic process. Soft tissues and spinal canal: No prevertebral fluid or swelling. No visible canal hematoma. Disc levels:  No CT evidence of high-grade spinal canal stenosis Upper chest: New ground-glass nodule in the left upper lobe (series 2, image 67) measuring to 1.2 cm. Other: None IMPRESSION: 1. No acute intracranial abnormality. 2. No acute fracture or traumatic subluxation of the cervical spine. 3. New ground-glass nodule in the left upper lobe measuring to 1.2 cm.  Recommend follow up chest CT in 6 months. Electronically Signed   By: Lorenza Cambridge M.D.   On: 12/06/2023 17:26     Data Reviewed: Relevant notes from primary care and specialist visits, past discharge summaries as available in EHR, including Care Everywhere. Prior diagnostic testing as pertinent to current admission diagnoses Updated medications and problem lists for reconciliation ED course, including vitals, labs, imaging, treatment and response to treatment Triage notes, nursing and pharmacy notes and ED provider's notes Notable results as noted in HPI  Assessment and Plan: No notes have been filed under this hospital service. Service: Hospitalist       DVT prophylaxis:  Heparin   Consults:  None   Advance Care Planning:    Code Status: Prior   Family Communication:  None   Disposition Plan:  Home   Severity of Illness: The appropriate patient status for this patient is INPATIENT. Inpatient status is judged to be reasonable and necessary in order to provide the required intensity of service to ensure the patient's safety. The patient's presenting symptoms, physical exam findings, and initial radiographic and laboratory data in the context of their chronic comorbidities is felt to place them at high risk for further clinical deterioration. Furthermore, it is not anticipated that the patient will be medically stable for discharge from the hospital within 2 midnights of admission.   * I certify that at  the point of admission it is my clinical judgment that the patient will require inpatient hospital care spanning beyond 2 midnights from the point of admission due to high intensity of service, high risk for further deterioration and high frequency of surveillance required.*  Author: Gertha Calkin, MD 12/06/2023 9:58 PM  For on call review [email or web address].  Fall precaution. PT Eval prior to discharge.   Sepsis due to pneumonia [facility]) Patient meets sepsis criteria with vitals, source of infection. Will continue with  supportive care with antiemetics antipyretics generalized IV fluids at low rate.  Meds ordered this encounter  Medications   sodium chloride 0.9 % bolus 1,000 mL   iohexol (OMNIPAQUE) 350 MG/ML injection 80 mL   cefTRIAXone (ROCEPHIN) 2 g in sodium chloride 0.9 % 100 mL IVPB    Order Specific Question:   Antibiotic Indication:    Answer:   CAP   azithromycin (ZITHROMAX) 500 mg in sodium chloride 0.9 % 250 mL IVPB    Order Specific Question:   Antibiotic Indication:    Answer:   CAP      Metabolic acidosis Patient has been bicarb of 20, normal anion gap, normal lactic acid is normal.  Will follow  DM type 2 with diabetic mixed hyperlipidemia (HCC) Controlled diabetes mellitus type 2, hold glipizide metformin [name] to prevent renal toxicity with meds..  Hydralazine for blood pressure.  Acquired hypothyroidism Continue levothyroxine 75.  Dyspnea 2/2 to PNA.  Pulse oximetry checks: SpO2: 97 %   Stage 3b chronic kidney disease (HCC) Lab Results  Component Value Date   CREATININE 1.22 (H) 12/06/2023   CREATININE 1.62 (H) 07/14/2020   CREATININE 1.75 (H) 07/13/2020  [name] is  continued as it is home med dot not expect creatinine is acute.  WE will hold diuretic.    Chronic low back pain As needed Tylenol.  Type 2 diabetes mellitus with hyperlipidemia (HCC) Swallow eval, glycemic protocol.  Hold glipizide and metformin,.   Essential hypertension Vitals:   12/06/23 1559 12/06/23 1946 12/06/23 2128 12/06/23 2319  BP: (!) 145/101 135/76 (!) 134/59 (!) 137/108  Will continue patient [name] 10 mg twice a day.  Monitor blood pressure and resume Maxide in a.m. if and when needed     DVT prophylaxis:  Heparin   Consults:  None   Advance Care Planning:    Code Status: Full Code   Family Communication:  None   Disposition Plan:  Home   Severity of Illness: The appropriate patient status for this patient is INPATIENT. Inpatient status is judged to be  reasonable and necessary in order to provide the required intensity of service to ensure the patient's safety. The patient's presenting symptoms, physical exam findings, and initial radiographic and laboratory data in the context of their chronic comorbidities is felt to place them at high risk for further clinical deterioration. Furthermore, it is not anticipated that the patient will be medically stable for discharge from the hospital within 2 midnights of admission.   * I certify that at the point of admission it is my clinical judgment that the patient will require inpatient hospital care spanning beyond 2 midnights from the point of admission due to high intensity of service, high risk for further deterioration and high frequency of surveillance required.*  Author: Gertha Calkin, MD 12/07/2023 12:41 AM  For on call review [email or web address].

## 2023-12-07 ENCOUNTER — Other Ambulatory Visit: Payer: Self-pay

## 2023-12-07 DIAGNOSIS — Y92009 Unspecified place in unspecified non-institutional (private) residence as the place of occurrence of the external cause: Secondary | ICD-10-CM

## 2023-12-07 DIAGNOSIS — W19XXXA Unspecified fall, initial encounter: Secondary | ICD-10-CM

## 2023-12-07 DIAGNOSIS — A419 Sepsis, unspecified organism: Secondary | ICD-10-CM

## 2023-12-07 DIAGNOSIS — I482 Chronic atrial fibrillation, unspecified: Secondary | ICD-10-CM | POA: Insufficient documentation

## 2023-12-07 DIAGNOSIS — J189 Pneumonia, unspecified organism: Secondary | ICD-10-CM | POA: Diagnosis not present

## 2023-12-07 DIAGNOSIS — K529 Noninfective gastroenteritis and colitis, unspecified: Secondary | ICD-10-CM | POA: Insufficient documentation

## 2023-12-07 LAB — C DIFFICILE QUICK SCREEN W PCR REFLEX
C Diff antigen: NEGATIVE
C Diff interpretation: NOT DETECTED
C Diff toxin: NEGATIVE

## 2023-12-07 LAB — CBC
HCT: 33.5 % — ABNORMAL LOW (ref 36.0–46.0)
Hemoglobin: 11.7 g/dL — ABNORMAL LOW (ref 12.0–15.0)
MCH: 29.8 pg (ref 26.0–34.0)
MCHC: 34.9 g/dL (ref 30.0–36.0)
MCV: 85.5 fL (ref 80.0–100.0)
Platelets: 155 10*3/uL (ref 150–400)
RBC: 3.92 MIL/uL (ref 3.87–5.11)
RDW: 12.9 % (ref 11.5–15.5)
WBC: 13.6 10*3/uL — ABNORMAL HIGH (ref 4.0–10.5)
nRBC: 0 % (ref 0.0–0.2)

## 2023-12-07 LAB — GASTROINTESTINAL PANEL BY PCR, STOOL (REPLACES STOOL CULTURE)

## 2023-12-07 LAB — MAGNESIUM: Magnesium: 1.6 mg/dL — ABNORMAL LOW (ref 1.7–2.4)

## 2023-12-07 LAB — STREP PNEUMONIAE URINARY ANTIGEN: Strep Pneumo Urinary Antigen: NEGATIVE

## 2023-12-07 LAB — BASIC METABOLIC PANEL
Anion gap: 9 (ref 5–15)
BUN: 21 mg/dL (ref 8–23)
CO2: 20 mmol/L — ABNORMAL LOW (ref 22–32)
Calcium: 8.5 mg/dL — ABNORMAL LOW (ref 8.9–10.3)
Chloride: 103 mmol/L (ref 98–111)
Creatinine, Ser: 1.29 mg/dL — ABNORMAL HIGH (ref 0.44–1.00)
GFR, Estimated: 42 mL/min — ABNORMAL LOW (ref >60)
Glucose, Bld: 155 mg/dL — ABNORMAL HIGH (ref 70–99)
Potassium: 3.7 mmol/L (ref 3.5–5.1)
Sodium: 132 mmol/L — ABNORMAL LOW (ref 135–145)

## 2023-12-07 LAB — OCCULT BLOOD X 1 CARD TO LAB, STOOL: Fecal Occult Bld: NEGATIVE

## 2023-12-07 LAB — LACTIC ACID, PLASMA: Lactic Acid, Venous: 0.9 mmol/L (ref 0.5–1.9)

## 2023-12-07 MED ORDER — GUAIFENESIN ER 600 MG PO TB12: 600.0000 mg | ORAL_TABLET | Freq: Two times a day (BID) | ORAL | Status: DC | PRN

## 2023-12-07 MED ORDER — ONDANSETRON HCL 4 MG PO TABS: 4.0000 mg | ORAL_TABLET | Freq: Four times a day (QID) | ORAL | Status: DC | PRN

## 2023-12-07 MED ORDER — SODIUM CHLORIDE 0.9 % IV SOLN: 500.0000 mg | INTRAVENOUS | Status: DC

## 2023-12-07 MED ORDER — DOCUSATE SODIUM 100 MG PO CAPS: 100.0000 mg | ORAL_CAPSULE | Freq: Two times a day (BID) | ORAL | Status: DC

## 2023-12-07 MED ORDER — AZITHROMYCIN 500 MG PO TABS: 500.0000 mg | ORAL_TABLET | Freq: Every day | ORAL | 0 refills | Status: AC

## 2023-12-07 MED ORDER — HEPARIN SODIUM (PORCINE) 5000 UNIT/ML IJ SOLN
5000.0000 [IU] | Freq: Three times a day (TID) | INTRAMUSCULAR | Status: DC
Start: 2023-12-07 — End: 2023-12-07

## 2023-12-07 MED ORDER — POLYETHYLENE GLYCOL 3350 17 G PO PACK: 17.0000 g | PACK | Freq: Every day | ORAL | Status: DC | PRN

## 2023-12-07 MED ORDER — SODIUM CHLORIDE 0.9 % IV SOLN: INTRAVENOUS | Status: DC

## 2023-12-07 MED ORDER — BISACODYL 5 MG PO TBEC: 5.0000 mg | DELAYED_RELEASE_TABLET | Freq: Every day | ORAL | Status: DC | PRN

## 2023-12-07 MED ORDER — AMOXICILLIN-POT CLAVULANATE 875-125 MG PO TABS: 1.0000 | ORAL_TABLET | Freq: Two times a day (BID) | ORAL | 0 refills | Status: AC

## 2023-12-07 MED ORDER — ONDANSETRON HCL 4 MG/2ML IJ SOLN: 4.0000 mg | Freq: Four times a day (QID) | INTRAMUSCULAR | Status: DC | PRN

## 2023-12-07 MED ORDER — HYDRALAZINE HCL 20 MG/ML IJ SOLN: 5.0000 mg | INTRAMUSCULAR | Status: DC | PRN

## 2023-12-07 MED ORDER — SODIUM CHLORIDE 0.9 % IV SOLN: 2.0000 g | INTRAVENOUS | Status: DC

## 2023-12-07 NOTE — Assessment & Plan Note (Signed)
Vitals:   12/06/23 1559 12/06/23 1946 12/06/23 2128 12/06/23 2319  BP: (!) 145/101 135/76 (!) 134/59 (!) 137/108  Will continue patient [name] 10 mg twice a day.  Monitor blood pressure and resume Maxide in a.m. if and when needed

## 2023-12-07 NOTE — ED Notes (Signed)
The daughter of the pt was informed that medications will be rx'd but just waiting on lab results to safely d/c her. Ms. Clinton Sawyer informed me that they are going home after the medications are Rx'd without lab results. Dr. Fran Lowes was notified.

## 2023-12-07 NOTE — Discharge Summary (Signed)
Physician Discharge Summary   Erika Nelson  female DOB: [birth year]  WUJ:[id number]  PCP: Danella Penton, MD  Admit date: 12/06/2023 Discharge date: 12/07/2023  Admitted From: home Disposition:  home Daughter updated at bedside prior to discharge.  CODE STATUS: Full code  Discharge Instructions     Discharge instructions   Complete by: As directed    You have received 1 day of IV antibiotics for pneumonia.  Since you would like to go home and are not very symptomatic, you can finish 4 more days of oral antibiotics Augmentin and Azithromycin as directed, starting tonight.  As mentioned, you have a ~1 cm nodule in your left upper lung.  Please follow up with your outpatient provider for repeat CT scan in 6 months. [facility] Course:  For full details, please see H&P, progress notes, consult notes and ancillary notes.  Briefly,  Erika Nelson is a 82 y.o. female with medical history significant for hypertension, obstructive sleep apnea presenting for generalized weakness and diarrhea and fall.  * Fall at home, initial encounter Daughter decided to take pt home before PT eval.   Sepsis due to pneumonia (HCC) --tachycardia, leukocytosis.  CTA chest showed "Posterior right upper lobe airspace disease, most consistent with bronchopneumonia."  Pt however did not have obvious respiratory symptoms, nor was hypoxic.  Pt received 1 dose of ceftriaxone and azithromycin in the ED.  Due to the delay in getting a room and having to wait in the ED hallway, daughter decided to take pt home the day after admission.  Since pt was relatively asymptomatic in terms of PNA, she was discharged with 4 more days of Augmentin and Azithromycin.     DM type 2 with diabetic mixed hyperlipidemia (HCC) --resume home glipizide after discharge.   Acquired hypothyroidism Continue levothyroxine.   Stage 3b chronic kidney disease (HCC) --Cr at baseline  Essential hypertension --cont home  olmesartan   Incidental lung nodule --left upper lobe measuring to 1.2 cm. Recommend follow up chest CT in 6 months.   Discharge Diagnoses:  Principal Problem:   Fall at home, initial encounter Active Problems:   Sepsis due to pneumonia [facility])   Essential hypertension   Type 2 diabetes mellitus with hyperlipidemia (HCC)   Chronic low back pain   Stage 3b chronic kidney disease (HCC)   Dyspnea   Acquired hypothyroidism   DM type 2 with diabetic mixed hyperlipidemia (HCC)   Metabolic acidosis   Chronic diarrhea   Chronic atrial fibrillation with RVR (HCC)   Pneumonia   30 Day Unplanned Readmission Risk Score    Flowsheet Row ED to Hosp-Admission (Current) from 12/06/2023 in [facility] Emergency Department at [facility]  30 Day Unplanned Readmission Risk Score (%) 9.84 Filed at 12/07/2023 0800       This score is the patient's risk of an unplanned readmission within 30 days of being discharged (0 -100%). The score is based on dignosis, age, lab data, medications, orders, and past utilization.   Low:  0-14.9   Medium: 15-21.9   High: 22-29.9   Extreme: 30 and above         Discharge Instructions:  Allergies as of 12/07/2023       Reactions   Albuterol    Norco [hydrocodone-acetaminophen] Other (See Comments)   "shaky"   Prozac [fluoxetine Hcl] Other (See Comments)   "shaky"   Sulfa Antibiotics Rash   Tramadol Other (See Comments)   Sleepy  Medication List     TAKE these medications    acetaminophen 500 MG tablet Commonly known as: TYLENOL Take 1,000 mg by mouth every 8 (eight) hours as needed.   ALPRAZolam 0.25 MG tablet Commonly known as: XANAX Take 0.25 mg by mouth at bedtime as needed.   amoxicillin-clavulanate 875-125 MG tablet Commonly known as: AUGMENTIN Take 1 tablet by mouth 2 (two) times daily for 4 days.   azithromycin 500 MG tablet Commonly known as: Zithromax Take 1 tablet (500 mg total) by mouth daily for 4 days.    Biotin 10 MG Tabs 10 mg.   bismuth subsalicylate 262 MG/15ML suspension Commonly known as: PEPTO BISMOL Take 30 mLs by mouth every 6 (six) hours as needed for diarrhea or loose stools.   CENTRUM SILVER WOMEN 50+ PO Take 2 tablets by mouth daily at 10 pm.   glipiZIDE 5 MG 24 hr tablet Commonly known as: GLUCOTROL XL Take 5 mg by mouth daily.   levothyroxine 75 MCG tablet Commonly known as: SYNTHROID Take 75 mcg by mouth daily before breakfast.   nystatin cream Commonly known as: MYCOSTATIN Apply 1 application  topically 2 (two) times daily as needed for dry skin.   olmesartan 20 MG tablet Commonly known as: [name] Take 20 mg by mouth daily.   omeprazole 40 MG capsule Commonly known as: PRILOSEC Take 40 mg by mouth daily.   PARoxetine 20 MG tablet Commonly known as: PAXIL Take 20 mg by mouth daily.   simvastatin 20 MG tablet Commonly known as: ZOCOR Take 20 mg by mouth at bedtime.   Trelegy Ellipta 100-62.5-25 MCG/INH Aepb Generic drug: Fluticasone-Umeclidin-Vilant Inhale 1 puff into the lungs daily at 6 (six) AM.         Follow-up Information     Danella Penton, MD Follow up in 1 week(s).   Specialty: Internal Medicine Contact information: [phone] [facility] [address] [facility] [place] [address] [phone]                 Allergies  Allergen Reactions   Albuterol    Norco [Hydrocodone-Acetaminophen] Other (See Comments)    "shaky"   Prozac [Fluoxetine Hcl] Other (See Comments)    "shaky"   Sulfa Antibiotics Rash   Tramadol Other (See Comments)    Sleepy     The results of significant diagnostics from this hospitalization (including imaging, microbiology, ancillary and laboratory) are listed below for reference.   Consultations:   Procedures/Studies: CT Angio Chest PE W and/or Wo Contrast  Result Date: 12/06/2023 CLINICAL DATA:  Larey Seat, generalized weakness, diarrhea, short of breath, mid back pain EXAM: CT  ANGIOGRAPHY CHEST CT ABDOMEN AND PELVIS WITH CONTRAST TECHNIQUE: Multidetector CT imaging of the chest was performed using the standard protocol during bolus administration of intravenous contrast. Multiplanar CT image reconstructions and MIPs were obtained to evaluate the vascular anatomy. Multidetector CT imaging of the abdomen and pelvis was performed using the standard protocol during bolus administration of intravenous contrast. RADIATION DOSE REDUCTION: This exam was performed according to the departmental dose-optimization program which includes automated exposure control, adjustment of the mA and/or kV according to patient size and/or use of iterative reconstruction technique. CONTRAST:  80mL OMNIPAQUE IOHEXOL 350 MG/ML SOLN COMPARISON:  07/12/2020, 12/06/2023 FINDINGS: CTA CHEST FINDINGS Cardiovascular: This is a technically adequate evaluation of the pulmonary vasculature. No filling defects or pulmonary emboli. The heart is unremarkable without pericardial effusion. Stable calcification of the mitral annulus. No evidence of thoracic aortic aneurysm or  dissection. Atherosclerosis of the aorta and coronary vasculature. Mediastinum/Nodes: No enlarged mediastinal, hilar, or axillary lymph nodes. Thyroid gland, trachea, and esophagus demonstrate no significant findings. Lungs/Pleura: There is an area of dense consolidation within the right upper lobe abutting the major fissure posteriorly most compatible with pneumonia. Dependent areas of consolidation within the lower lobes favor atelectasis. No effusion or pneumothorax. Central airways are patent. Musculoskeletal: No acute or destructive bony lesions. Reconstructed images demonstrate no additional findings. Review of the MIP images confirms the above findings. CT ABDOMEN and PELVIS FINDINGS Hepatobiliary: No focal liver abnormality is seen. No gallstones, gallbladder wall thickening, or biliary dilatation. Pancreas: Unremarkable. No pancreatic ductal  dilatation or surrounding inflammatory changes. Spleen: Normal in size without focal abnormality. Adrenals/Urinary Tract: Severe right renal atrophy and cortical thinning. Left kidney is unremarkable. No urinary tract calculi or obstructive uropathy. The adrenals and bladder are unremarkable. Stomach/Bowel: No bowel obstruction or ileus. Normal appendix right upper quadrant. No bowel wall thickening or inflammatory change. Vascular/Lymphatic: Aortic atherosclerosis. No enlarged abdominal or pelvic lymph nodes. Reproductive: 4.8 x 4.6 cm simple left ovarian cyst. Uterus and right ovary are unremarkable. Other: No free fluid or free intraperitoneal gas. No abdominal wall hernia. Musculoskeletal: No acute or destructive bony abnormalities. Reconstructed images demonstrate no additional findings. Review of the MIP images confirms the above findings. IMPRESSION: Chest: 1. No evidence of pulmonary embolus. 2. Posterior right upper lobe airspace disease, most consistent with bronchopneumonia. Followup PA and lateral chest X-ray is recommended in 3-4 weeks following trial of antibiotic therapy to ensure resolution and exclude underlying malignancy. 3. Aortic Atherosclerosis (ICD10-I70.0). Coronary artery atherosclerosis. Abdomen/pelvis: 1. No acute intra-abdominal or intrapelvic process. 2. 4.8 cm simple left ovarian cyst. Follow-up pelvic ultrasound in 6 months recommended. 3. Right renal atrophy. 4.  Aortic Atherosclerosis (ICD10-I70.0). Electronically Signed   By: Sharlet Salina M.D.   On: 12/06/2023 21:38   CT ABDOMEN PELVIS W CONTRAST  Result Date: 12/06/2023 CLINICAL DATA:  Larey Seat, generalized weakness, diarrhea, short of breath, mid back pain EXAM: CT ANGIOGRAPHY CHEST CT ABDOMEN AND PELVIS WITH CONTRAST TECHNIQUE: Multidetector CT imaging of the chest was performed using the standard protocol during bolus administration of intravenous contrast. Multiplanar CT image reconstructions and MIPs were obtained to  evaluate the vascular anatomy. Multidetector CT imaging of the abdomen and pelvis was performed using the standard protocol during bolus administration of intravenous contrast. RADIATION DOSE REDUCTION: This exam was performed according to the departmental dose-optimization program which includes automated exposure control, adjustment of the mA and/or kV according to patient size and/or use of iterative reconstruction technique. CONTRAST:  80mL OMNIPAQUE IOHEXOL 350 MG/ML SOLN COMPARISON:  07/12/2020, 12/06/2023 FINDINGS: CTA CHEST FINDINGS Cardiovascular: This is a technically adequate evaluation of the pulmonary vasculature. No filling defects or pulmonary emboli. The heart is unremarkable without pericardial effusion. Stable calcification of the mitral annulus. No evidence of thoracic aortic aneurysm or dissection. Atherosclerosis of the aorta and coronary vasculature. Mediastinum/Nodes: No enlarged mediastinal, hilar, or axillary lymph nodes. Thyroid gland, trachea, and esophagus demonstrate no significant findings. Lungs/Pleura: There is an area of dense consolidation within the right upper lobe abutting the major fissure posteriorly most compatible with pneumonia. Dependent areas of consolidation within the lower lobes favor atelectasis. No effusion or pneumothorax. Central airways are patent. Musculoskeletal: No acute or destructive bony lesions. Reconstructed images demonstrate no additional findings. Review of the MIP images confirms the above findings. CT ABDOMEN and PELVIS FINDINGS Hepatobiliary: No focal liver abnormality is seen. No gallstones, gallbladder  wall thickening, or biliary dilatation. Pancreas: Unremarkable. No pancreatic ductal dilatation or surrounding inflammatory changes. Spleen: Normal in size without focal abnormality. Adrenals/Urinary Tract: Severe right renal atrophy and cortical thinning. Left kidney is unremarkable. No urinary tract calculi or obstructive uropathy. The adrenals and  bladder are unremarkable. Stomach/Bowel: No bowel obstruction or ileus. Normal appendix right upper quadrant. No bowel wall thickening or inflammatory change. Vascular/Lymphatic: Aortic atherosclerosis. No enlarged abdominal or pelvic lymph nodes. Reproductive: 4.8 x 4.6 cm simple left ovarian cyst. Uterus and right ovary are unremarkable. Other: No free fluid or free intraperitoneal gas. No abdominal wall hernia. Musculoskeletal: No acute or destructive bony abnormalities. Reconstructed images demonstrate no additional findings. Review of the MIP images confirms the above findings. IMPRESSION: Chest: 1. No evidence of pulmonary embolus. 2. Posterior right upper lobe airspace disease, most consistent with bronchopneumonia. Followup PA and lateral chest X-ray is recommended in 3-4 weeks following trial of antibiotic therapy to ensure resolution and exclude underlying malignancy. 3. Aortic Atherosclerosis (ICD10-I70.0). Coronary artery atherosclerosis. Abdomen/pelvis: 1. No acute intra-abdominal or intrapelvic process. 2. 4.8 cm simple left ovarian cyst. Follow-up pelvic ultrasound in 6 months recommended. 3. Right renal atrophy. 4.  Aortic Atherosclerosis (ICD10-I70.0). Electronically Signed   By: Sharlet Salina M.D.   On: 12/06/2023 21:38   DG Ribs Unilateral W/Chest Right  Result Date: 12/06/2023 CLINICAL DATA:  Right rib pain following fall, initial encounter EXAM: RIGHT RIBS AND CHEST - 3+ VIEW COMPARISON:  07/12/2020 FINDINGS: Cardiac shadow is within normal limits. Aortic calcifications are noted. Lungs are well aerated bilaterally. No focal infiltrate, effusion pneumothorax is noted. Minimal atelectatic changes are noted in the inferior aspect of the right upper lobe along the minor fissure. No acute rib abnormality is noted. IMPRESSION: No rib fracture is noted. Mild right upper lobe atelectasis along the minor fissure. Electronically Signed   By: Alcide Clever M.D.   On: 12/06/2023 21:13   DG Thoracic  Spine 2 View  Result Date: 12/06/2023 CLINICAL DATA:  Unwitnessed fall EXAM: THORACIC SPINE 2 VIEWS COMPARISON:  Chest CT 07/12/2020 FINDINGS: Scoliosis. Vertebral body heights are maintained. Mild multilevel degenerative osteophytes. IMPRESSION: Scoliosis and mild degenerative change. Electronically Signed   By: Jasmine Pang M.D.   On: 12/06/2023 17:36   CT Head Wo Contrast  Result Date: 12/06/2023 CLINICAL DATA:  Head trauma, minor (Age >= 65y); Neck trauma (Age >= 65y) EXAM: CT HEAD WITHOUT CONTRAST CT CERVICAL SPINE WITHOUT CONTRAST TECHNIQUE: Multidetector CT imaging of the head and cervical spine was performed following the standard protocol without intravenous contrast. Multiplanar CT image reconstructions of the cervical spine were also generated. RADIATION DOSE REDUCTION: This exam was performed according to the departmental dose-optimization program which includes automated exposure control, adjustment of the mA and/or kV according to patient size and/or use of iterative reconstruction technique. COMPARISON:  CTA chest 07/01/2020 FINDINGS: CT HEAD FINDINGS Brain: No evidence of acute infarction, hemorrhage, hydrocephalus, extra-axial collection or mass lesion/mass effect. Vascular: No hyperdense vessel or unexpected calcification. Skull: Normal. Negative for fracture or focal lesion. Sinuses/Orbits: No middle ear or mastoid effusion. Paranasal sinuses are clear. Orbits are unremarkable. Other: None. CT CERVICAL SPINE FINDINGS Alignment: Grade 1 anterolisthesis of C3 on C4 and C4 on C5 Skull base and vertebrae: No acute fracture. No primary bone lesion or focal pathologic process. Soft tissues and spinal canal: No prevertebral fluid or swelling. No visible canal hematoma. Disc levels:  No CT evidence of high-grade spinal canal stenosis Upper chest: New ground-glass nodule in  the left upper lobe (series 2, image 67) measuring to 1.2 cm. Other: None IMPRESSION: 1. No acute intracranial abnormality. 2.  No acute fracture or traumatic subluxation of the cervical spine. 3. New ground-glass nodule in the left upper lobe measuring to 1.2 cm. Recommend follow up chest CT in 6 months. Electronically Signed   By: Lorenza Cambridge M.D.   On: 12/06/2023 17:26   CT Cervical Spine Wo Contrast  Result Date: 12/06/2023 CLINICAL DATA:  Head trauma, minor (Age >= 65y); Neck trauma (Age >= 65y) EXAM: CT HEAD WITHOUT CONTRAST CT CERVICAL SPINE WITHOUT CONTRAST TECHNIQUE: Multidetector CT imaging of the head and cervical spine was performed following the standard protocol without intravenous contrast. Multiplanar CT image reconstructions of the cervical spine were also generated. RADIATION DOSE REDUCTION: This exam was performed according to the departmental dose-optimization program which includes automated exposure control, adjustment of the mA and/or kV according to patient size and/or use of iterative reconstruction technique. COMPARISON:  CTA chest 07/01/2020 FINDINGS: CT HEAD FINDINGS Brain: No evidence of acute infarction, hemorrhage, hydrocephalus, extra-axial collection or mass lesion/mass effect. Vascular: No hyperdense vessel or unexpected calcification. Skull: Normal. Negative for fracture or focal lesion. Sinuses/Orbits: No middle ear or mastoid effusion. Paranasal sinuses are clear. Orbits are unremarkable. Other: None. CT CERVICAL SPINE FINDINGS Alignment: Grade 1 anterolisthesis of C3 on C4 and C4 on C5 Skull base and vertebrae: No acute fracture. No primary bone lesion or focal pathologic process. Soft tissues and spinal canal: No prevertebral fluid or swelling. No visible canal hematoma. Disc levels:  No CT evidence of high-grade spinal canal stenosis Upper chest: New ground-glass nodule in the left upper lobe (series 2, image 67) measuring to 1.2 cm. Other: None IMPRESSION: 1. No acute intracranial abnormality. 2. No acute fracture or traumatic subluxation of the cervical spine. 3. New ground-glass nodule in the  left upper lobe measuring to 1.2 cm. Recommend follow up chest CT in 6 months. Electronically Signed   By: Lorenza Cambridge M.D.   On: 12/06/2023 17:26      Labs: BNP (last 3 results) No results for input(s): "BNP" in the last 8760 hours. Basic Metabolic Panel: Recent Labs  Lab 12/06/23 1604 12/07/23 0747  NA 133* 132*  K 4.0 3.7  CL 100 103  CO2 20* 20*  GLUCOSE 207* 155*  BUN 21 21  CREATININE 1.22* 1.29*  CALCIUM 9.3 8.5*  MG  --  1.6*   Liver Function Tests: Recent Labs  Lab 12/06/23 1604  AST 22  ALT 20  ALKPHOS 79  BILITOT 1.7*  PROT 7.4  ALBUMIN 3.8   Recent Labs  Lab 12/06/23 1604  LIPASE 25   No results for input(s): "AMMONIA" in the last 168 hours. CBC: Recent Labs  Lab 12/06/23 1604 12/07/23 0747  WBC 19.4* 13.6*  HGB 14.3 11.7*  HCT 40.5 33.5*  MCV 84.0 85.5  PLT 200 155   Cardiac Enzymes: Recent Labs  Lab 12/06/23 1606  CKTOTAL 81   BNP: Invalid input(s): "POCBNP" CBG: No results for input(s): "GLUCAP" in the last 168 hours. D-Dimer Recent Labs    12/06/23 2011  DDIMER 0.87*   Hgb A1c No results for input(s): "HGBA1C" in the last 72 hours. Lipid Profile No results for input(s): "CHOL", "HDL", "LDLCALC", "TRIG", "CHOLHDL", "LDLDIRECT" in the last 72 hours. Thyroid function studies No results for input(s): "TSH", "T4TOTAL", "T3FREE", "THYROIDAB" in the last 72 hours.  Invalid input(s): "FREET3" Anemia work up No results for input(s): "VITAMINB12", "FOLATE", "  FERRITIN", "TIBC", "IRON", "RETICCTPCT" in the last 72 hours. Urinalysis    Component Value Date/Time   COLORURINE YELLOW (A) 12/06/2023 2200   APPEARANCEUR HAZY (A) 12/06/2023 2200   APPEARANCEUR Turbid 03/07/2013 0923   LABSPEC 1.028 12/06/2023 2200   LABSPEC 1.019 03/07/2013 0923   PHURINE 6.0 12/06/2023 2200   GLUCOSEU NEGATIVE 12/06/2023 2200   GLUCOSEU Negative 03/07/2013 0923   HGBUR NEGATIVE 12/06/2023 2200   BILIRUBINUR NEGATIVE 12/06/2023 2200   BILIRUBINUR  Negative 03/07/2013 0923   KETONESUR NEGATIVE 12/06/2023 2200   PROTEINUR 30 (A) 12/06/2023 2200   NITRITE NEGATIVE 12/06/2023 2200   LEUKOCYTESUR LARGE (A) 12/06/2023 2200   LEUKOCYTESUR 3+ 03/07/2013 0923   Sepsis Labs Recent Labs  Lab 12/06/23 1604 12/07/23 0747  WBC 19.4* 13.6*   Microbiology Recent Results (from the past 240 hour(s))  Blood culture (routine x 2)     Status: None (Preliminary result)   Collection Time: 12/06/23  8:11 PM   Specimen: BLOOD  Result Value Ref Range Status   Specimen Description BLOOD BLOOD LEFT ARM  Final   Special Requests   Final    BOTTLES DRAWN AEROBIC AND ANAEROBIC Blood Culture results may not be optimal due to an inadequate volume of blood received in culture bottles   Culture   Final    NO GROWTH < 12 HOURS Performed at [facility], [address] [place], [place] [address]    Report Status PENDING  Incomplete  Blood culture (routine x 2)     Status: None (Preliminary result)   Collection Time: 12/06/23  8:11 PM   Specimen: BLOOD  Result Value Ref Range Status   Specimen Description BLOOD BLOOD LEFT ARM  Final   Special Requests   Final    BOTTLES DRAWN AEROBIC AND ANAEROBIC Blood Culture results may not be optimal due to an inadequate volume of blood received in culture bottles   Culture   Final    NO GROWTH < 12 HOURS Performed at [facility], [address] [place], [place] [address]    Report Status PENDING  Incomplete  Gastrointestinal Panel by PCR , Stool     Status: None   Collection Time: 12/07/23 12:34 AM   Specimen: STOOL  Result Value Ref Range Status   Campylobacter species NOT DETECTED NOT DETECTED Final   Plesimonas shigelloides NOT DETECTED NOT DETECTED Final   Salmonella species NOT DETECTED NOT DETECTED Final   Yersinia enterocolitica NOT DETECTED NOT DETECTED Final   Vibrio species NOT DETECTED NOT DETECTED Final   Vibrio cholerae NOT DETECTED NOT DETECTED Final   Enteroaggregative E  coli (EAEC) NOT DETECTED NOT DETECTED Final   Enteropathogenic E coli (EPEC) NOT DETECTED NOT DETECTED Final   Enterotoxigenic E coli (ETEC) NOT DETECTED NOT DETECTED Final   Shiga like toxin producing E coli (STEC) NOT DETECTED NOT DETECTED Final   Shigella/Enteroinvasive E coli (EIEC) NOT DETECTED NOT DETECTED Final   Cryptosporidium NOT DETECTED NOT DETECTED Final   Cyclospora cayetanensis NOT DETECTED NOT DETECTED Final   Entamoeba histolytica NOT DETECTED NOT DETECTED Final   Giardia lamblia NOT DETECTED NOT DETECTED Final   Adenovirus F40/41 NOT DETECTED NOT DETECTED Final   Astrovirus NOT DETECTED NOT DETECTED Final   Norovirus GI/GII NOT DETECTED NOT DETECTED Final   Rotavirus A NOT DETECTED NOT DETECTED Final   Sapovirus (I, II, IV, and V) NOT DETECTED NOT DETECTED Final    Comment: Performed at [facility], [address]  [place], [place] [address]  C Difficile Quick Screen w PCR reflex     Status: None   Collection Time: 12/07/23 12:34 AM   Specimen: STOOL  Result Value Ref Range Status   C Diff antigen NEGATIVE NEGATIVE Final   C Diff toxin NEGATIVE NEGATIVE Final   C Diff interpretation No C. difficile detected.  Final    Comment: Performed at [facility], [address] [place], [place] [address]     Total time spend on discharging this patient, including the last patient exam, discussing the hospital stay, instructions for ongoing care as it relates to all pertinent caregivers, as well as preparing the medical discharge records, prescriptions, and/or referrals as applicable, is 50 minutes.    Darlin Priestly, MD  [facility] 12/07/2023, 8:47 AM

## 2023-12-07 NOTE — ED Notes (Signed)
The pt's daughter has expressed concern and aggravation that the pt has been in the hallway the entire length of her stay. The pt's daughter requested the pt's meds be sent to the pharmacy and the pt be discharged. The attending physician Dr. Fran Lowes advised the pt would be discharged. The pt's iv was removed and the pt was assisted the wheel chair without incident. The pt and her daughter were given the discharge papers. The pt was then wheeled to the car and assisted in the car without incident.

## 2023-12-07 NOTE — Assessment & Plan Note (Signed)
Lab Results  Component Value Date   CREATININE 1.22 (H) 12/06/2023   CREATININE 1.62 (H) 07/14/2020   CREATININE 1.75 (H) 07/13/2020  [name] is  continued as it is home med dot not expect creatinine is acute.  WE will hold diuretic.

## 2023-12-07 NOTE — ED Notes (Signed)
Maisie Fus the house supervisor is talking to the                             +

## 2023-12-07 NOTE — Assessment & Plan Note (Signed)
2/2 to PNA.  Pulse oximetry checks: SpO2: 97 %

## 2023-12-07 NOTE — Care Management CC44 (Signed)
Condition Code 44 Documentation Completed  Patient Details  Name: AMANTI KUBICK MRN: [id number] Date of Birth: 10/05/1941   Condition Code 44 given:  Yes Patient signature on Condition Code 44 notice:  Yes Documentation of 2 MD's agreement:  Yes Code 44 added to claim:  Yes    Alejos Reinhardt E Kolina Kube, LCSW 12/07/2023, 9:10 AM

## 2023-12-07 NOTE — Assessment & Plan Note (Signed)
Continue levothyroxine 75.

## 2023-12-07 NOTE — Assessment & Plan Note (Signed)
-   As needed Tylenol -Outpatient ENT evaluation 

## 2023-12-07 NOTE — Assessment & Plan Note (Signed)
Swallow eval, glycemic protocol.  Hold glipizide and metformin,.

## 2023-12-07 NOTE — Assessment & Plan Note (Signed)
Patient has been bicarb of 20, normal anion gap, normal lactic acid is normal.  Will follow

## 2023-12-07 NOTE — Assessment & Plan Note (Signed)
Controlled diabetes mellitus type 2, hold glipizide metformin [name] to prevent renal toxicity with meds..  Hydralazine for blood pressure.

## 2023-12-07 NOTE — ED Notes (Signed)
This RN to bedside to speak with patient's daughter. Pt's daughter irate due to patient remaining in the hallway overnight. This RN repeatedly apologized and explained to patient's daughter why patient remained in the hallway. Pt's daughter stated that if this RN "couldn't handle it [she] would go above [my] head". This RN offered for house supervisor to come speak with patient and daughter. Pt's daughter states anger regarding pt "being exposed" when she had to get up to go to the bathroom due to the EKG leads, pt currently sitting wheelchair after being assisted to wheelchair by primary RN to go to the bathroom. This RN once again apologized and explained delay with patient going upstairs, offered to have the house supervisor come and speak with her, daughter demanded that she get next room that was open. This RN apologized again and acknowledged daughter's wishes in regards to patient care, [facility] notified of daughter request to speak with him.

## 2023-12-08 LAB — LACTIC ACID, PLASMA: Lactic Acid, Venous: 2 mmol/L (ref 0.5–1.9)

## 2023-12-08 NOTE — ED Notes (Addendum)
Spoke with patient's daughter regarding patient being in the hallway. I apologized for the wait but the daughter stated that she wanted the next available room. I explained that I could not promise her the next available room due to not having information about what was in the waiting room or what was coming in from the community which is why the ED charge RN and Triage RNs who had that information would have to make the determination of when and if they could get a room. I also explained that at present there were no inpatient rooms available and that any movement from the ED would depend on discharges later.

## 2023-12-11 LAB — CULTURE, BLOOD (ROUTINE X 2)
Culture: NO GROWTH
Culture: NO GROWTH

## 2024-01-08 DIAGNOSIS — E1169 Type 2 diabetes mellitus with other specified complication: Secondary | ICD-10-CM | POA: Diagnosis not present

## 2024-01-08 DIAGNOSIS — E782 Mixed hyperlipidemia: Secondary | ICD-10-CM | POA: Diagnosis not present

## 2024-01-15 DIAGNOSIS — F02A3 Dementia in other diseases classified elsewhere, mild, with mood disturbance: Secondary | ICD-10-CM | POA: Diagnosis not present

## 2024-01-15 DIAGNOSIS — Z0000 Encounter for general adult medical examination without abnormal findings: Secondary | ICD-10-CM | POA: Diagnosis not present

## 2024-01-15 DIAGNOSIS — I2781 Cor pulmonale (chronic): Secondary | ICD-10-CM | POA: Diagnosis not present

## 2024-01-15 DIAGNOSIS — J4540 Moderate persistent asthma, uncomplicated: Secondary | ICD-10-CM | POA: Diagnosis not present

## 2024-01-15 DIAGNOSIS — Z1331 Encounter for screening for depression: Secondary | ICD-10-CM | POA: Diagnosis not present

## 2024-01-15 DIAGNOSIS — N183 Chronic kidney disease, stage 3 unspecified: Secondary | ICD-10-CM | POA: Diagnosis not present

## 2024-01-15 DIAGNOSIS — Z8701 Personal history of pneumonia (recurrent): Secondary | ICD-10-CM | POA: Diagnosis not present

## 2024-01-15 DIAGNOSIS — G309 Alzheimer's disease, unspecified: Secondary | ICD-10-CM | POA: Diagnosis not present

## 2024-01-15 DIAGNOSIS — J189 Pneumonia, unspecified organism: Secondary | ICD-10-CM | POA: Diagnosis not present

## 2024-01-15 DIAGNOSIS — E1122 Type 2 diabetes mellitus with diabetic chronic kidney disease: Secondary | ICD-10-CM | POA: Diagnosis not present

## 2024-03-03 DIAGNOSIS — H25813 Combined forms of age-related cataract, bilateral: Secondary | ICD-10-CM | POA: Diagnosis not present

## 2024-03-03 DIAGNOSIS — H3561 Retinal hemorrhage, right eye: Secondary | ICD-10-CM | POA: Diagnosis not present

## 2024-03-03 DIAGNOSIS — H35033 Hypertensive retinopathy, bilateral: Secondary | ICD-10-CM | POA: Diagnosis not present

## 2024-03-03 DIAGNOSIS — I10 Essential (primary) hypertension: Secondary | ICD-10-CM | POA: Diagnosis not present

## 2024-03-04 ENCOUNTER — Ambulatory Visit
Admission: RE | Admit: 2024-03-04 | Discharge: 2024-03-04 | Disposition: A | Discharge status: Home / Self Care | Source: Ambulatory Visit | Attending: Internal Medicine | Admitting: Internal Medicine

## 2024-03-04 ENCOUNTER — Other Ambulatory Visit: Payer: Self-pay | Admitting: Internal Medicine

## 2024-03-04 DIAGNOSIS — H5461 Unqualified visual loss, right eye, normal vision left eye: Secondary | ICD-10-CM | POA: Diagnosis not present

## 2024-03-04 DIAGNOSIS — L89300 Pressure ulcer of unspecified buttock, unstageable: Secondary | ICD-10-CM | POA: Diagnosis not present

## 2024-03-04 DIAGNOSIS — H547 Unspecified visual loss: Secondary | ICD-10-CM | POA: Diagnosis not present

## 2024-03-04 DIAGNOSIS — G319 Degenerative disease of nervous system, unspecified: Secondary | ICD-10-CM | POA: Diagnosis not present

## 2024-03-04 DIAGNOSIS — I6782 Cerebral ischemia: Secondary | ICD-10-CM | POA: Diagnosis not present

## 2024-03-04 DIAGNOSIS — I639 Cerebral infarction, unspecified: Secondary | ICD-10-CM | POA: Diagnosis not present

## 2024-03-04 DIAGNOSIS — Z8673 Personal history of transient ischemic attack (TIA), and cerebral infarction without residual deficits: Secondary | ICD-10-CM | POA: Diagnosis not present

## 2024-03-04 DIAGNOSIS — K529 Noninfective gastroenteritis and colitis, unspecified: Secondary | ICD-10-CM | POA: Diagnosis not present

## 2024-03-06 DIAGNOSIS — H34831 Tributary (branch) retinal vein occlusion, right eye, with macular edema: Secondary | ICD-10-CM | POA: Diagnosis not present

## 2024-03-18 ENCOUNTER — Other Ambulatory Visit: Payer: Self-pay

## 2024-03-18 ENCOUNTER — Emergency Department

## 2024-03-18 ENCOUNTER — Inpatient Hospital Stay
Admission: EM | Admit: 2024-03-18 | Discharge: 2024-03-26 | DRG: 481 | Disposition: A | Discharge status: Home Health | Attending: Internal Medicine | Admitting: Internal Medicine

## 2024-03-18 ENCOUNTER — Encounter: Payer: Self-pay | Admitting: Emergency Medicine

## 2024-03-18 DIAGNOSIS — F32A Depression, unspecified: Secondary | ICD-10-CM | POA: Insufficient documentation

## 2024-03-18 DIAGNOSIS — N1832 Chronic kidney disease, stage 3b: Secondary | ICD-10-CM | POA: Diagnosis not present

## 2024-03-18 DIAGNOSIS — Z803 Family history of malignant neoplasm of breast: Secondary | ICD-10-CM | POA: Diagnosis not present

## 2024-03-18 DIAGNOSIS — F0283 Dementia in other diseases classified elsewhere, unspecified severity, with mood disturbance: Secondary | ICD-10-CM | POA: Diagnosis present

## 2024-03-18 DIAGNOSIS — Z7984 Long term (current) use of oral hypoglycemic drugs: Secondary | ICD-10-CM | POA: Diagnosis not present

## 2024-03-18 DIAGNOSIS — Z6831 Body mass index (BMI) 31.0-31.9, adult: Secondary | ICD-10-CM | POA: Diagnosis not present

## 2024-03-18 DIAGNOSIS — Z9181 History of falling: Secondary | ICD-10-CM | POA: Diagnosis not present

## 2024-03-18 DIAGNOSIS — Z885 Allergy status to narcotic agent status: Secondary | ICD-10-CM

## 2024-03-18 DIAGNOSIS — E66811 Obesity, class 1: Secondary | ICD-10-CM | POA: Diagnosis present

## 2024-03-18 DIAGNOSIS — F0284 Dementia in other diseases classified elsewhere, unspecified severity, with anxiety: Secondary | ICD-10-CM | POA: Diagnosis present

## 2024-03-18 DIAGNOSIS — E890 Postprocedural hypothyroidism: Secondary | ICD-10-CM | POA: Diagnosis not present

## 2024-03-18 DIAGNOSIS — S7291XA Unspecified fracture of right femur, initial encounter for closed fracture: Secondary | ICD-10-CM | POA: Diagnosis not present

## 2024-03-18 DIAGNOSIS — Z888 Allergy status to other drugs, medicaments and biological substances status: Secondary | ICD-10-CM

## 2024-03-18 DIAGNOSIS — Z823 Family history of stroke: Secondary | ICD-10-CM | POA: Diagnosis not present

## 2024-03-18 DIAGNOSIS — I10 Essential (primary) hypertension: Secondary | ICD-10-CM | POA: Diagnosis present

## 2024-03-18 DIAGNOSIS — W19XXXA Unspecified fall, initial encounter: Secondary | ICD-10-CM | POA: Diagnosis not present

## 2024-03-18 DIAGNOSIS — I129 Hypertensive chronic kidney disease with stage 1 through stage 4 chronic kidney disease, or unspecified chronic kidney disease: Secondary | ICD-10-CM | POA: Diagnosis not present

## 2024-03-18 DIAGNOSIS — M25561 Pain in right knee: Secondary | ICD-10-CM | POA: Diagnosis not present

## 2024-03-18 DIAGNOSIS — F419 Anxiety disorder, unspecified: Secondary | ICD-10-CM | POA: Diagnosis present

## 2024-03-18 DIAGNOSIS — Z471 Aftercare following joint replacement surgery: Secondary | ICD-10-CM | POA: Diagnosis not present

## 2024-03-18 DIAGNOSIS — Z604 Social exclusion and rejection: Secondary | ICD-10-CM | POA: Diagnosis present

## 2024-03-18 DIAGNOSIS — S72011A Unspecified intracapsular fracture of right femur, initial encounter for closed fracture: Secondary | ICD-10-CM | POA: Diagnosis present

## 2024-03-18 DIAGNOSIS — M25562 Pain in left knee: Secondary | ICD-10-CM

## 2024-03-18 DIAGNOSIS — Y9301 Activity, walking, marching and hiking: Secondary | ICD-10-CM | POA: Diagnosis present

## 2024-03-18 DIAGNOSIS — E119 Type 2 diabetes mellitus without complications: Secondary | ICD-10-CM | POA: Diagnosis present

## 2024-03-18 DIAGNOSIS — Z01818 Encounter for other preprocedural examination: Secondary | ICD-10-CM | POA: Diagnosis not present

## 2024-03-18 DIAGNOSIS — Z96651 Presence of right artificial knee joint: Secondary | ICD-10-CM | POA: Diagnosis present

## 2024-03-18 DIAGNOSIS — Z800 Family history of malignant neoplasm of digestive organs: Secondary | ICD-10-CM | POA: Diagnosis not present

## 2024-03-18 DIAGNOSIS — E1122 Type 2 diabetes mellitus with diabetic chronic kidney disease: Secondary | ICD-10-CM | POA: Diagnosis not present

## 2024-03-18 DIAGNOSIS — N179 Acute kidney failure, unspecified: Secondary | ICD-10-CM | POA: Diagnosis not present

## 2024-03-18 DIAGNOSIS — E039 Hypothyroidism, unspecified: Secondary | ICD-10-CM | POA: Diagnosis present

## 2024-03-18 DIAGNOSIS — E785 Hyperlipidemia, unspecified: Secondary | ICD-10-CM | POA: Diagnosis present

## 2024-03-18 DIAGNOSIS — S72001A Fracture of unspecified part of neck of right femur, initial encounter for closed fracture: Secondary | ICD-10-CM | POA: Diagnosis present

## 2024-03-18 DIAGNOSIS — I700 Atherosclerosis of aorta: Secondary | ICD-10-CM | POA: Diagnosis not present

## 2024-03-18 DIAGNOSIS — Z79899 Other long term (current) drug therapy: Secondary | ICD-10-CM | POA: Diagnosis not present

## 2024-03-18 DIAGNOSIS — Z882 Allergy status to sulfonamides status: Secondary | ICD-10-CM

## 2024-03-18 DIAGNOSIS — Z79890 Hormone replacement therapy (postmenopausal): Secondary | ICD-10-CM | POA: Diagnosis not present

## 2024-03-18 DIAGNOSIS — M1712 Unilateral primary osteoarthritis, left knee: Secondary | ICD-10-CM | POA: Diagnosis present

## 2024-03-18 DIAGNOSIS — M25551 Pain in right hip: Secondary | ICD-10-CM | POA: Diagnosis not present

## 2024-03-18 DIAGNOSIS — W010XXA Fall on same level from slipping, tripping and stumbling without subsequent striking against object, initial encounter: Secondary | ICD-10-CM | POA: Diagnosis present

## 2024-03-18 DIAGNOSIS — G309 Alzheimer's disease, unspecified: Secondary | ICD-10-CM | POA: Diagnosis present

## 2024-03-18 DIAGNOSIS — S72001D Fracture of unspecified part of neck of right femur, subsequent encounter for closed fracture with routine healing: Secondary | ICD-10-CM | POA: Diagnosis not present

## 2024-03-18 DIAGNOSIS — E669 Obesity, unspecified: Secondary | ICD-10-CM | POA: Insufficient documentation

## 2024-03-18 DIAGNOSIS — F418 Other specified anxiety disorders: Secondary | ICD-10-CM | POA: Diagnosis not present

## 2024-03-18 DIAGNOSIS — Z7982 Long term (current) use of aspirin: Secondary | ICD-10-CM

## 2024-03-18 DIAGNOSIS — B372 Candidiasis of skin and nail: Secondary | ICD-10-CM | POA: Diagnosis present

## 2024-03-18 DIAGNOSIS — S72001S Fracture of unspecified part of neck of right femur, sequela: Secondary | ICD-10-CM

## 2024-03-18 NOTE — ED Triage Notes (Signed)
 PT fell 10days ago after she lost balance walking with walker. PT states the first 4 days were ok and then started to have right hip pain. Went to [facility] today and was told right hip fracture and to come to ER and have surgeon come evaluated.

## 2024-03-19 ENCOUNTER — Encounter: Payer: Self-pay | Admitting: Family Medicine

## 2024-03-19 ENCOUNTER — Encounter
Admission: EM | Disposition: A | Discharge status: Home Health | Payer: Self-pay | Source: Home / Self Care | Attending: Internal Medicine

## 2024-03-19 ENCOUNTER — Inpatient Hospital Stay

## 2024-03-19 ENCOUNTER — Inpatient Hospital Stay: Admitting: Anesthesiology

## 2024-03-19 ENCOUNTER — Encounter: Payer: Self-pay | Admitting: Anesthesiology

## 2024-03-19 ENCOUNTER — Other Ambulatory Visit: Payer: Self-pay

## 2024-03-19 DIAGNOSIS — Z7984 Long term (current) use of oral hypoglycemic drugs: Secondary | ICD-10-CM | POA: Diagnosis not present

## 2024-03-19 DIAGNOSIS — Z882 Allergy status to sulfonamides status: Secondary | ICD-10-CM | POA: Diagnosis not present

## 2024-03-19 DIAGNOSIS — S72011A Unspecified intracapsular fracture of right femur, initial encounter for closed fracture: Secondary | ICD-10-CM | POA: Diagnosis present

## 2024-03-19 DIAGNOSIS — N179 Acute kidney failure, unspecified: Secondary | ICD-10-CM | POA: Diagnosis not present

## 2024-03-19 DIAGNOSIS — Y9301 Activity, walking, marching and hiking: Secondary | ICD-10-CM | POA: Diagnosis present

## 2024-03-19 DIAGNOSIS — M25562 Pain in left knee: Secondary | ICD-10-CM | POA: Diagnosis not present

## 2024-03-19 DIAGNOSIS — S72001S Fracture of unspecified part of neck of right femur, sequela: Secondary | ICD-10-CM

## 2024-03-19 DIAGNOSIS — Z6831 Body mass index (BMI) 31.0-31.9, adult: Secondary | ICD-10-CM | POA: Diagnosis not present

## 2024-03-19 DIAGNOSIS — Z79899 Other long term (current) drug therapy: Secondary | ICD-10-CM | POA: Diagnosis not present

## 2024-03-19 DIAGNOSIS — E890 Postprocedural hypothyroidism: Secondary | ICD-10-CM | POA: Diagnosis not present

## 2024-03-19 DIAGNOSIS — W010XXA Fall on same level from slipping, tripping and stumbling without subsequent striking against object, initial encounter: Secondary | ICD-10-CM | POA: Diagnosis present

## 2024-03-19 DIAGNOSIS — M1712 Unilateral primary osteoarthritis, left knee: Secondary | ICD-10-CM | POA: Diagnosis present

## 2024-03-19 DIAGNOSIS — Z604 Social exclusion and rejection: Secondary | ICD-10-CM | POA: Diagnosis present

## 2024-03-19 DIAGNOSIS — Z96651 Presence of right artificial knee joint: Secondary | ICD-10-CM | POA: Diagnosis present

## 2024-03-19 DIAGNOSIS — Z803 Family history of malignant neoplasm of breast: Secondary | ICD-10-CM | POA: Diagnosis not present

## 2024-03-19 DIAGNOSIS — F0283 Dementia in other diseases classified elsewhere, unspecified severity, with mood disturbance: Secondary | ICD-10-CM | POA: Diagnosis present

## 2024-03-19 DIAGNOSIS — Z79890 Hormone replacement therapy (postmenopausal): Secondary | ICD-10-CM | POA: Diagnosis not present

## 2024-03-19 DIAGNOSIS — Z885 Allergy status to narcotic agent status: Secondary | ICD-10-CM | POA: Diagnosis not present

## 2024-03-19 DIAGNOSIS — I129 Hypertensive chronic kidney disease with stage 1 through stage 4 chronic kidney disease, or unspecified chronic kidney disease: Secondary | ICD-10-CM | POA: Diagnosis not present

## 2024-03-19 DIAGNOSIS — E785 Hyperlipidemia, unspecified: Secondary | ICD-10-CM | POA: Diagnosis present

## 2024-03-19 DIAGNOSIS — Z888 Allergy status to other drugs, medicaments and biological substances status: Secondary | ICD-10-CM | POA: Diagnosis not present

## 2024-03-19 DIAGNOSIS — E119 Type 2 diabetes mellitus without complications: Secondary | ICD-10-CM

## 2024-03-19 DIAGNOSIS — S72001A Fracture of unspecified part of neck of right femur, initial encounter for closed fracture: Secondary | ICD-10-CM | POA: Diagnosis present

## 2024-03-19 DIAGNOSIS — F419 Anxiety disorder, unspecified: Secondary | ICD-10-CM | POA: Insufficient documentation

## 2024-03-19 DIAGNOSIS — S72001D Fracture of unspecified part of neck of right femur, subsequent encounter for closed fracture with routine healing: Secondary | ICD-10-CM | POA: Diagnosis not present

## 2024-03-19 DIAGNOSIS — F418 Other specified anxiety disorders: Secondary | ICD-10-CM | POA: Diagnosis not present

## 2024-03-19 DIAGNOSIS — Z823 Family history of stroke: Secondary | ICD-10-CM | POA: Diagnosis not present

## 2024-03-19 DIAGNOSIS — E66811 Obesity, class 1: Secondary | ICD-10-CM | POA: Diagnosis present

## 2024-03-19 DIAGNOSIS — N1832 Chronic kidney disease, stage 3b: Secondary | ICD-10-CM | POA: Diagnosis not present

## 2024-03-19 DIAGNOSIS — E1122 Type 2 diabetes mellitus with diabetic chronic kidney disease: Secondary | ICD-10-CM | POA: Diagnosis not present

## 2024-03-19 DIAGNOSIS — E669 Obesity, unspecified: Secondary | ICD-10-CM | POA: Diagnosis not present

## 2024-03-19 DIAGNOSIS — I10 Essential (primary) hypertension: Secondary | ICD-10-CM | POA: Diagnosis present

## 2024-03-19 DIAGNOSIS — M25561 Pain in right knee: Secondary | ICD-10-CM | POA: Diagnosis not present

## 2024-03-19 DIAGNOSIS — F32A Depression, unspecified: Secondary | ICD-10-CM | POA: Diagnosis not present

## 2024-03-19 DIAGNOSIS — F0284 Dementia in other diseases classified elsewhere, unspecified severity, with anxiety: Secondary | ICD-10-CM | POA: Diagnosis present

## 2024-03-19 DIAGNOSIS — E039 Hypothyroidism, unspecified: Secondary | ICD-10-CM | POA: Insufficient documentation

## 2024-03-19 DIAGNOSIS — G309 Alzheimer's disease, unspecified: Secondary | ICD-10-CM | POA: Diagnosis present

## 2024-03-19 DIAGNOSIS — Z471 Aftercare following joint replacement surgery: Secondary | ICD-10-CM | POA: Diagnosis not present

## 2024-03-19 DIAGNOSIS — S7291XA Unspecified fracture of right femur, initial encounter for closed fracture: Secondary | ICD-10-CM | POA: Diagnosis not present

## 2024-03-19 DIAGNOSIS — Z800 Family history of malignant neoplasm of digestive organs: Secondary | ICD-10-CM | POA: Diagnosis not present

## 2024-03-19 HISTORY — PX: PERCUTANEOUS PINNING: SHX2209

## 2024-03-19 LAB — BASIC METABOLIC PANEL
Anion gap: 11 (ref 5–15)
Anion gap: 11 (ref 5–15)
BUN: 30 mg/dL — ABNORMAL HIGH (ref 8–23)
BUN: 30 mg/dL — ABNORMAL HIGH (ref 8–23)
CO2: 22 mmol/L (ref 22–32)
CO2: 24 mmol/L (ref 22–32)
Calcium: 9.9 mg/dL (ref 8.9–10.3)
Calcium: 9.9 mg/dL (ref 8.9–10.3)
Chloride: 106 mmol/L (ref 98–111)
Chloride: 105 mmol/L (ref 98–111)
Creatinine, Ser: 1.24 mg/dL — ABNORMAL HIGH (ref 0.44–1.00)
Creatinine, Ser: 1.09 mg/dL — ABNORMAL HIGH (ref 0.44–1.00)
GFR, Estimated: 44 mL/min — ABNORMAL LOW (ref >60)
GFR, Estimated: 51 mL/min — ABNORMAL LOW (ref >60)
Glucose, Bld: 164 mg/dL — ABNORMAL HIGH (ref 70–99)
Glucose, Bld: 103 mg/dL — ABNORMAL HIGH (ref 70–99)
Potassium: 3.8 mmol/L (ref 3.5–5.1)
Potassium: 3.8 mmol/L (ref 3.5–5.1)
Sodium: 139 mmol/L (ref 135–145)
Sodium: 140 mmol/L (ref 135–145)

## 2024-03-19 LAB — CBC
HCT: 40 % (ref 36.0–46.0)
HCT: 41.5 % (ref 36.0–46.0)
Hemoglobin: 14.1 g/dL (ref 12.0–15.0)
Hemoglobin: 14.3 g/dL (ref 12.0–15.0)
MCH: 29.8 pg (ref 26.0–34.0)
MCH: 30.1 pg (ref 26.0–34.0)
MCHC: 34.5 g/dL (ref 30.0–36.0)
MCHC: 35.3 g/dL (ref 30.0–36.0)
MCV: 84.6 fL (ref 80.0–100.0)
MCV: 87.4 fL (ref 80.0–100.0)
Platelets: 194 10*3/uL (ref 150–400)
Platelets: 206 10*3/uL (ref 150–400)
RBC: 4.73 MIL/uL (ref 3.87–5.11)
RBC: 4.75 MIL/uL (ref 3.87–5.11)
RDW: 13 % (ref 11.5–15.5)
RDW: 13.2 % (ref 11.5–15.5)
WBC: 7.5 10*3/uL (ref 4.0–10.5)
WBC: 8.1 10*3/uL (ref 4.0–10.5)
nRBC: 0 % (ref 0.0–0.2)
nRBC: 0 % (ref 0.0–0.2)

## 2024-03-19 LAB — MRSA NEXT GEN BY PCR, NASAL: MRSA by PCR Next Gen: NOT DETECTED

## 2024-03-19 LAB — TYPE AND SCREEN
ABO/RH(D): A POS
Antibody Screen: NEGATIVE

## 2024-03-19 LAB — GLUCOSE, CAPILLARY
Glucose-Capillary: 123 mg/dL — ABNORMAL HIGH (ref 70–99)
Glucose-Capillary: 133 mg/dL — ABNORMAL HIGH (ref 70–99)
Glucose-Capillary: 129 mg/dL — ABNORMAL HIGH (ref 70–99)
Glucose-Capillary: 236 mg/dL — ABNORMAL HIGH (ref 70–99)

## 2024-03-19 LAB — HEMOGLOBIN A1C
Hgb A1c MFr Bld: 6.1 % — ABNORMAL HIGH (ref 4.8–5.6)
Mean Plasma Glucose: 128.37 mg/dL

## 2024-03-19 SURGERY — PINNING, EXTREMITY, PERCUTANEOUS
Anesthesia: General | Site: Hip | Laterality: Right

## 2024-03-19 SURGERY — HEMIARTHROPLASTY, HIP, DIRECT ANTERIOR APPROACH, FOR FRACTURE
Anesthesia: Choice | Site: Hip | Laterality: Right

## 2024-03-19 SURGERY — HEMIARTHROPLASTY (BIPOLAR) HIP, POSTERIOR APPROACH FOR FRACTURE
Anesthesia: Choice | Laterality: Right

## 2024-03-19 MED ORDER — ALPRAZOLAM 0.25 MG PO TABS
0.2500 mg | ORAL_TABLET | Freq: Every evening | ORAL | Status: DC | PRN
  Administered 2024-03-19 – 2024-03-25 (×3): 0.25 mg via ORAL
  Filled 2024-03-19 (×3): qty 1

## 2024-03-19 MED ORDER — 0.9 % SODIUM CHLORIDE (POUR BTL) OPTIME
TOPICAL | Status: DC | PRN
  Administered 2024-03-19: 500 mL

## 2024-03-19 MED ORDER — OXYCODONE HCL 5 MG PO TABS
5.0000 mg | ORAL_TABLET | ORAL | Status: DC | PRN
  Administered 2024-03-20 – 2024-03-21 (×2): 10 mg via ORAL
  Filled 2024-03-19 (×2): qty 2

## 2024-03-19 MED ORDER — SUCCINYLCHOLINE CHLORIDE 200 MG/10ML IV SOSY
PREFILLED_SYRINGE | INTRAVENOUS | Status: DC | PRN
  Administered 2024-03-19: 80 mg via INTRAVENOUS

## 2024-03-19 MED ORDER — MORPHINE SULFATE (PF) 2 MG/ML IV SOLN
0.5000 mg | INTRAVENOUS | Status: DC | PRN
  Administered 2024-03-20: 0.5 mg via INTRAVENOUS
  Filled 2024-03-19: qty 1

## 2024-03-19 MED ORDER — BISMUTH SUBSALICYLATE 262 MG/15ML PO SUSP: 30.0000 mL | Freq: Four times a day (QID) | ORAL | Status: DC | PRN

## 2024-03-19 MED ORDER — LIDOCAINE HCL (CARDIAC) PF 100 MG/5ML IV SOSY
PREFILLED_SYRINGE | INTRAVENOUS | Status: DC | PRN
  Administered 2024-03-19: 100 mg via INTRAVENOUS

## 2024-03-19 MED ORDER — ROCURONIUM BROMIDE 100 MG/10ML IV SOLN
INTRAVENOUS | Status: DC | PRN
  Administered 2024-03-19: 30 mg via INTRAVENOUS

## 2024-03-19 MED ORDER — ACETAMINOPHEN 10 MG/ML IV SOLN: 1000.0000 mg | Freq: Once | INTRAVENOUS | Status: DC | PRN

## 2024-03-19 MED ORDER — FLUTICASONE FUROATE-VILANTEROL 100-25 MCG/ACT IN AEPB
1.0000 | INHALATION_SPRAY | Freq: Every day | RESPIRATORY_TRACT | Status: DC
  Administered 2024-03-21 – 2024-03-26 (×6): 1 via RESPIRATORY_TRACT
  Filled 2024-03-19: qty 28

## 2024-03-19 MED ORDER — MAGNESIUM HYDROXIDE 400 MG/5ML PO SUSP: 30.0000 mL | Freq: Every day | ORAL | Status: DC | PRN

## 2024-03-19 MED ORDER — DROPERIDOL 2.5 MG/ML IJ SOLN: 0.6250 mg | Freq: Once | INTRAMUSCULAR | Status: DC | PRN

## 2024-03-19 MED ORDER — FENTANYL CITRATE (PF) 100 MCG/2ML IJ SOLN
INTRAMUSCULAR | Status: DC | PRN
  Administered 2024-03-19 (×4): 25 ug via INTRAVENOUS

## 2024-03-19 MED ORDER — MENTHOL 3 MG MT LOZG: 1.0000 | LOZENGE | OROMUCOSAL | Status: DC | PRN

## 2024-03-19 MED ORDER — METOCLOPRAMIDE HCL 5 MG PO TABS: 5.0000 mg | ORAL_TABLET | Freq: Three times a day (TID) | ORAL | Status: DC | PRN

## 2024-03-19 MED ORDER — CEFAZOLIN SODIUM-DEXTROSE 2-4 GM/100ML-% IV SOLN
INTRAVENOUS | Status: AC
  Filled 2024-03-19: qty 100

## 2024-03-19 MED ORDER — INSULIN ASPART 100 UNIT/ML IJ SOLN
0.0000 [IU] | Freq: Three times a day (TID) | INTRAMUSCULAR | Status: DC
  Administered 2024-03-19: 1 [IU] via SUBCUTANEOUS
  Administered 2024-03-20 – 2024-03-21 (×4): 2 [IU] via SUBCUTANEOUS
  Administered 2024-03-22: 5 [IU] via SUBCUTANEOUS
  Administered 2024-03-22 (×2): 1 [IU] via SUBCUTANEOUS
  Administered 2024-03-23: 5 [IU] via SUBCUTANEOUS
  Administered 2024-03-23: 1 [IU] via SUBCUTANEOUS
  Administered 2024-03-24 (×2): 2 [IU] via SUBCUTANEOUS
  Administered 2024-03-25: 3 [IU] via SUBCUTANEOUS
  Administered 2024-03-25: 1 [IU] via SUBCUTANEOUS
  Administered 2024-03-25 – 2024-03-26 (×2): 2 [IU] via SUBCUTANEOUS
  Filled 2024-03-19 (×16): qty 1

## 2024-03-19 MED ORDER — SEVOFLURANE IN SOLN
RESPIRATORY_TRACT | Status: AC
  Filled 2024-03-19: qty 250

## 2024-03-19 MED ORDER — PHENYLEPHRINE 80 MCG/ML (10ML) SYRINGE FOR IV PUSH (FOR BLOOD PRESSURE SUPPORT)
PREFILLED_SYRINGE | INTRAVENOUS | Status: DC | PRN
  Administered 2024-03-19 (×5): 160 ug via INTRAVENOUS

## 2024-03-19 MED ORDER — FLUCONAZOLE 50 MG PO TABS
150.0000 mg | ORAL_TABLET | Freq: Once | ORAL | Status: AC
  Administered 2024-03-19: 150 mg via ORAL
  Filled 2024-03-19: qty 1

## 2024-03-19 MED ORDER — GLIPIZIDE ER 5 MG PO TB24: 5.0000 mg | ORAL_TABLET | Freq: Every day | ORAL | Status: DC

## 2024-03-19 MED ORDER — BUPIVACAINE-EPINEPHRINE (PF) 0.25% -1:200000 IJ SOLN
INTRAMUSCULAR | Status: DC | PRN
  Administered 2024-03-19: 20 mL

## 2024-03-19 MED ORDER — INSULIN ASPART 100 UNIT/ML IJ SOLN
0.0000 [IU] | Freq: Every day | INTRAMUSCULAR | Status: DC
  Administered 2024-03-19: 2 [IU] via SUBCUTANEOUS
  Filled 2024-03-19: qty 1

## 2024-03-19 MED ORDER — SODIUM CHLORIDE 0.9 % IV SOLN: INTRAVENOUS | Status: DC | PRN

## 2024-03-19 MED ORDER — SIMVASTATIN 20 MG PO TABS
20.0000 mg | ORAL_TABLET | Freq: Every day | ORAL | Status: DC
Start: 2024-03-19 — End: 2024-03-26
  Administered 2024-03-19 – 2024-03-25 (×7): 20 mg via ORAL
  Filled 2024-03-19 (×7): qty 1

## 2024-03-19 MED ORDER — EPHEDRINE SULFATE-NACL 50-0.9 MG/10ML-% IV SOSY
PREFILLED_SYRINGE | INTRAVENOUS | Status: DC | PRN
  Administered 2024-03-19 (×2): 5 mg via INTRAVENOUS

## 2024-03-19 MED ORDER — TRAMADOL HCL 50 MG PO TABS
25.0000 mg | ORAL_TABLET | Freq: Once | ORAL | Status: DC
  Administered 2024-03-19: 25 mg via ORAL

## 2024-03-19 MED ORDER — PANTOPRAZOLE SODIUM 40 MG PO TBEC
40.0000 mg | DELAYED_RELEASE_TABLET | Freq: Every day | ORAL | Status: DC
  Administered 2024-03-19 – 2024-03-26 (×8): 40 mg via ORAL
  Filled 2024-03-19 (×8): qty 1

## 2024-03-19 MED ORDER — FENTANYL CITRATE (PF) 100 MCG/2ML IJ SOLN
INTRAMUSCULAR | Status: AC
  Filled 2024-03-19: qty 2

## 2024-03-19 MED ORDER — PROPOFOL 10 MG/ML IV BOLUS
INTRAVENOUS | Status: DC | PRN
  Administered 2024-03-19: 100 mg via INTRAVENOUS
  Administered 2024-03-19: 20 mg via INTRAVENOUS

## 2024-03-19 MED ORDER — PAROXETINE HCL 20 MG PO TABS
20.0000 mg | ORAL_TABLET | Freq: Every day | ORAL | Status: DC
  Administered 2024-03-19 – 2024-03-26 (×8): 20 mg via ORAL
  Filled 2024-03-19 (×8): qty 1

## 2024-03-19 MED ORDER — ONDANSETRON HCL 4 MG/2ML IJ SOLN
4.0000 mg | INTRAMUSCULAR | Status: DC | PRN
  Administered 2024-03-19: 4 mg via INTRAVENOUS

## 2024-03-19 MED ORDER — NYSTATIN-TRIAMCINOLONE 100000-0.1 UNIT/GM-% EX OINT
TOPICAL_OINTMENT | Freq: Once | CUTANEOUS | Status: DC
  Filled 2024-03-19: qty 1

## 2024-03-19 MED ORDER — DOCUSATE SODIUM 100 MG PO CAPS
100.0000 mg | ORAL_CAPSULE | Freq: Two times a day (BID) | ORAL | Status: DC
  Administered 2024-03-19 – 2024-03-25 (×10): 100 mg via ORAL
  Filled 2024-03-19 (×13): qty 1

## 2024-03-19 MED ORDER — SUGAMMADEX SODIUM 200 MG/2ML IV SOLN
INTRAVENOUS | Status: DC | PRN
  Administered 2024-03-19: 175 mg via INTRAVENOUS

## 2024-03-19 MED ORDER — DEXAMETHASONE SODIUM PHOSPHATE 10 MG/ML IJ SOLN
INTRAMUSCULAR | Status: DC | PRN
  Administered 2024-03-19: 5 mg via INTRAVENOUS

## 2024-03-19 MED ORDER — ADULT MULTIVITAMIN W/MINERALS CH
1.0000 | ORAL_TABLET | Freq: Every day | ORAL | Status: DC
  Administered 2024-03-19 – 2024-03-25 (×7): 1 via ORAL
  Filled 2024-03-19 (×7): qty 1

## 2024-03-19 MED ORDER — NYSTATIN 100000 UNIT/GM EX CREA: 1.0000 | TOPICAL_CREAM | Freq: Two times a day (BID) | CUTANEOUS | Status: DC | PRN

## 2024-03-19 MED ORDER — ACETAMINOPHEN 500 MG PO TABS
500.0000 mg | ORAL_TABLET | Freq: Four times a day (QID) | ORAL | Status: AC
  Administered 2024-03-19 – 2024-03-20 (×3): 500 mg via ORAL
  Filled 2024-03-19 (×4): qty 1

## 2024-03-19 MED ORDER — CEFAZOLIN SODIUM-DEXTROSE 2-4 GM/100ML-% IV SOLN
2.0000 g | INTRAVENOUS | Status: AC
  Administered 2024-03-19: 2 g via INTRAVENOUS

## 2024-03-19 MED ORDER — METOCLOPRAMIDE HCL 5 MG/ML IJ SOLN: 5.0000 mg | Freq: Three times a day (TID) | INTRAMUSCULAR | Status: DC | PRN

## 2024-03-19 MED ORDER — BUPIVACAINE-EPINEPHRINE (PF) 0.25% -1:200000 IJ SOLN
INTRAMUSCULAR | Status: AC
  Filled 2024-03-19: qty 30

## 2024-03-19 MED ORDER — ENOXAPARIN SODIUM 40 MG/0.4ML IJ SOSY
40.0000 mg | PREFILLED_SYRINGE | INTRAMUSCULAR | Status: DC
  Administered 2024-03-20 – 2024-03-26 (×7): 40 mg via SUBCUTANEOUS
  Filled 2024-03-19 (×7): qty 0.4

## 2024-03-19 MED ORDER — PHENOL 1.4 % MT LIQD: 1.0000 | OROMUCOSAL | Status: DC | PRN

## 2024-03-19 MED ORDER — ACETAMINOPHEN 325 MG PO TABS: 650.0000 mg | ORAL_TABLET | ORAL | Status: DC | PRN

## 2024-03-19 MED ORDER — NYSTATIN-TRIAMCINOLONE 100000-0.1 UNIT/GM-% EX OINT
TOPICAL_OINTMENT | Freq: Once | CUTANEOUS | Status: AC
  Filled 2024-03-19: qty 1

## 2024-03-19 MED ORDER — LEVOTHYROXINE SODIUM 50 MCG PO TABS
75.0000 ug | ORAL_TABLET | Freq: Every day | ORAL | Status: DC
  Administered 2024-03-20 – 2024-03-26 (×7): 75 ug via ORAL
  Filled 2024-03-19 (×7): qty 1

## 2024-03-19 MED ORDER — FLUCONAZOLE 50 MG PO TABS: 150.0000 mg | ORAL_TABLET | Freq: Once | ORAL | Status: DC

## 2024-03-19 MED ORDER — IRBESARTAN 75 MG PO TABS
37.5000 mg | ORAL_TABLET | Freq: Every day | ORAL | Status: DC
  Administered 2024-03-19 – 2024-03-26 (×8): 37.5 mg via ORAL
  Filled 2024-03-19 (×8): qty 0.5

## 2024-03-19 SURGICAL SUPPLY — 38 items
BIT DRILL CANNULATED 5.0 (BIT) IMPLANT
BNDG COHESIVE 6X5 TAN ST LF (GAUZE/BANDAGES/DRESSINGS) ×2 IMPLANT
CHLORAPREP W/TINT 26 (MISCELLANEOUS) ×1 IMPLANT
DERMABOND ADVANCED .7 DNX12 (GAUZE/BANDAGES/DRESSINGS) ×1 IMPLANT
DRAPE C-ARM XRAY 36X54 (DRAPES) ×1 IMPLANT
DRAPE SHEET LG 3/4 BI-LAMINATE (DRAPES) ×2 IMPLANT
DRSG OPSITE POSTOP 3X4 (GAUZE/BANDAGES/DRESSINGS) ×1 IMPLANT
ELECT REM PT RETURN 9FT ADLT (ELECTROSURGICAL) ×1 IMPLANT
ELECTRODE REM PT RTRN 9FT ADLT (ELECTROSURGICAL) IMPLANT
GLOVE BIOGEL PI IND STRL 8 (GLOVE) ×1 IMPLANT
GLOVE PI ORTHO PRO STRL 7.5 (GLOVE) ×2 IMPLANT
GLOVE PI ORTHO PRO STRL SZ8 (GLOVE) IMPLANT
GLOVE SURG SYN 7.5 E (GLOVE) ×1 IMPLANT
GLOVE SURG SYN 7.5 PF PI (GLOVE) ×1 IMPLANT
GOWN SRG XL LVL 3 NONREINFORCE (GOWNS) IMPLANT
GUIDE PIN 3.2MM 5PK (PIN) ×1 IMPLANT
KIT PATIENT CARE HANA TABLE (KITS) ×1 IMPLANT
MANIFOLD NEPTUNE II (INSTRUMENTS) IMPLANT
MAT ABSORB FLUID 56X50 GRAY (MISCELLANEOUS) ×1 IMPLANT
NDL HYPO 21X1.5 SAFETY (NEEDLE) ×1 IMPLANT
NEEDLE HYPO 21X1.5 SAFETY (NEEDLE) ×1 IMPLANT
NS IRRIG 500ML POUR BTL (IV SOLUTION) ×1 IMPLANT
PACK HIP COMPR (MISCELLANEOUS) ×1 IMPLANT
PIN GUIDE 3.2MM 5PK (PIN) IMPLANT
SCREW CANN RVRS CUT FLT 85X16X (Screw) IMPLANT
SCREW CANN SD 7X95 (Screw) IMPLANT
SLEEVE SCD COMPRESS KNEE MED (STOCKING) ×1 IMPLANT
SUT STRATA 1 CT-1 DLB (SUTURE) ×1 IMPLANT
SUT VIC AB 1 CT1 36 (SUTURE) ×1 IMPLANT
SUT VIC AB 2-0 CT2 27 (SUTURE) ×1 IMPLANT
SUTURE STRATA 1 CT-1 DLB (SUTURE) ×1 IMPLANT
SUTURE STRATA SPIR 4-0 18 (SUTURE) ×1 IMPLANT
SYR 20ML LL LF (SYRINGE) ×1 IMPLANT
TAPE MICROFOAM 4IN (TAPE) IMPLANT
TRAP FLUID SMOKE EVACUATOR (MISCELLANEOUS) IMPLANT
TRAY FOL W/BAG SLVR 16FR STRL (SET/KITS/TRAYS/PACK) IMPLANT
WASHER 5.5MM STAINLESS STEEL (Washer) IMPLANT
WATER STERILE IRR 1000ML POUR (IV SOLUTION) ×1 IMPLANT

## 2024-03-19 NOTE — ED Provider Notes (Signed)
 [facility] Provider Note    Event Date/Time   First MD Initiated Contact with Patient 03/18/24 2358     (approximate)   History   Fall and Hip Pain   HPI  Erika Nelson is a 83 y.o. female with history of hypertension, hyperlipidemia who presents to the emergency department with complaints of right hip pain.  Was seen at [facility] clinic and told that she had a hip fracture and that she would need to come to the emergency department for admission for surgery.  She reports she fell 10 days ago.  States she was walking through her house with her cane.  She is not sure what caused her to fall but denies any dizziness, chest pain, shortness of breath, palpitations.  States she has been able to ambulate but ambulation does increase her pain.  She did not hit her head when she fell.  She is on aspirin but no other antiplatelet or anticoagulant.  She is also requesting someone evaluate the rash underneath her abdomen and inguinal area.  She has been using nystatin powder without relief.  She states it is pruritic in nature.   History provided by patient, granddaughter.    Past Medical History:  Diagnosis Date   Anxiety    Cervicalgia    Diverticulosis    Generalized OA    Osteoarthritis bilateral knees   GERD (gastroesophageal reflux disease)    Headache    Hyperlipidemia    Hypertension    Occasional tremors     Past Surgical History:  Procedure Laterality Date   bilateral foot surgery Bilateral    COLONOSCOPY WITH PROPOFOL N/A 03/12/2016   Procedure: COLONOSCOPY WITH PROPOFOL;  Surgeon: Christena Deem, MD;  Location: [facility] ENDOSCOPY;  Service: Endoscopy;  Laterality: N/A;   KNEE ARTHROSCOPY Left    TOTAL KNEE ARTHROPLASTY Right 07/26/2016   Procedure: TOTAL KNEE ARTHROPLASTY;  Surgeon: Kennedy Bucker, MD;  Location: [facility] ORS;  Service: Orthopedics;  Laterality: Right;    MEDICATIONS:  Prior to Admission medications   Medication Sig Start Date End  Date Taking? Authorizing Provider  acetaminophen (TYLENOL) 500 MG tablet Take 1,000 mg by mouth every 8 (eight) hours as needed.    [provider]  ALPRAZolam Prudy Feeler) 0.25 MG tablet Take 0.25 mg by mouth at bedtime as needed. 12/06/23   [provider]  Biotin 10 MG TABS 10 mg. 08/22/22   [provider]  bismuth subsalicylate (PEPTO BISMOL) 262 MG/15ML suspension Take 30 mLs by mouth every 6 (six) hours as needed for diarrhea or loose stools.    [provider]  Fluticasone-Umeclidin-Vilant (TRELEGY ELLIPTA) 100-62.5-25 MCG/INH AEPB Inhale 1 puff into the lungs daily at 6 (six) AM.    [provider]  glipiZIDE (GLUCOTROL XL) 5 MG 24 hr tablet Take 5 mg by mouth daily. 06/16/20   [provider]  levothyroxine (SYNTHROID, LEVOTHROID) 75 MCG tablet Take 75 mcg by mouth daily before breakfast.    [provider]  Multiple Vitamins-Minerals (CENTRUM SILVER WOMEN 50+ PO) Take 2 tablets by mouth daily at 10 pm.    [provider]  nystatin cream (MYCOSTATIN) Apply 1 application  topically 2 (two) times daily as needed for dry skin.    [provider]  olmesartan ([name]) 20 MG tablet Take 20 mg by mouth daily. 06/16/20   [provider]  omeprazole (PRILOSEC) 40 MG capsule Take 40 mg by mouth daily. 06/16/20   [provider]  PARoxetine (PAXIL) 20 MG tablet Take 20 mg by mouth daily.    [provider]  simvastatin (ZOCOR) 20 MG tablet Take 20 mg by mouth at bedtime. 06/16/20   [provider]    Physical Exam   Triage Vital Signs: ED Triage Vitals  Encounter Vitals Group     BP 03/18/24 1710 (!) 138/91     Systolic BP Percentile --      Diastolic BP Percentile --      Pulse Rate 03/18/24 1710 78     Resp 03/18/24 1710 17     Temp 03/18/24 1710 98.4 F (36.9 C)     Temp Source 03/18/24 1710 Oral     SpO2 03/18/24 1710 98 %     Weight 03/18/24 1711 183 lb (83 kg)     Height  03/18/24 1711 5\' 4"  (1.626 m)     Head Circumference --      Peak Flow --      Pain Score 03/18/24 1715 2     Pain Loc --      Pain Education --      Exclude from Growth Chart --     Most recent vital signs: Vitals:   03/18/24 1710 03/19/24 0050  BP: (!) 138/91 (!) 172/83  Pulse: 78 71  Resp: 17 18  Temp: 98.4 F (36.9 C) 97.7 F (36.5 C)  SpO2: 98% 97%     CONSTITUTIONAL: Alert, responds appropriately to questions. Well-appearing; well-nourished; GCS 15 HEAD: Normocephalic; atraumatic EYES: Conjunctivae clear, PERRL, EOMI ENT: normal nose; no rhinorrhea; moist mucous membranes; pharynx without lesions noted; no dental injury; no septal hematoma, no epistaxis; no facial deformity or bony tenderness NECK: Supple, no midline spinal tenderness, step-off or deformity; trachea midline CARD: RRR; S1 and S2 appreciated; no murmurs, no clicks, no rubs, no gallops RESP: Normal chest excursion without splinting or tachypnea; breath sounds clear and equal bilaterally; no wheezes, no rhonchi, no rales; no hypoxia or respiratory distress CHEST:  chest wall stable, no crepitus or ecchymosis or deformity, nontender to palpation; no flail chest ABD/GI: Non-distended; soft, non-tender, no rebound, no guarding; no ecchymosis or other lesions noted PELVIS:  stable, tender over the right hip BACK:  The back appears normal; no midline spinal tenderness, step-off or deformity EXT: 2+ DP pulses bilaterally, compartments are soft, extremities are warm and well-perfused, no ecchymosis SKIN: Normal color for age and race; warm, rash consistent with candidal dermatitis to the suprapubic area and inguinal area without signs of superimposed infection NEURO: No facial asymmetry, normal speech, moving all extremities equally  ED Results / Procedures / Treatments   LABS: (all labs ordered are listed, but only abnormal results are displayed) Labs Reviewed  BASIC METABOLIC PANEL - Abnormal; Notable for the  following components:      Result Value   Glucose, Bld 164 (*)    BUN 30 (*)    Creatinine, Ser 1.24 (*)    GFR, Estimated 44 (*)    All other components within normal limits  CBC     EKG:  EKG Interpretation Date/Time:  Thursday March 19 2024 00:26:26 EDT Ventricular Rate:  78 PR Interval:  188 QRS Duration:  86 QT Interval:  380 QTC Calculation: 433 R Axis:   79  Text Interpretation: Sinus rhythm with Premature atrial complexes Otherwise normal ECG When compared with ECG of 07-Dec-2023 01:21, Premature atrial complexes are now Present Confirmed by Fairley Copher, Baxter Hire [phone]) on 03/19/2024 12:45:45 AM  RADIOLOGY: My personal review and interpretation of imaging: Chest x-ray shows no acute abnormality.  X-ray of the right hip and CT of the right hip show a subcapital femoral neck fracture.  I have personally reviewed all radiology reports. DG Chest 1 View Result Date: 03/18/2024 CLINICAL DATA:  Preop, fall EXAM: CHEST  1 VIEW COMPARISON:  Radiograph and CT 12/06/2023 FINDINGS: Stable cardiomediastinal silhouette. Aortic atherosclerotic calcification. No focal consolidation, pleural effusion, or pneumothorax. No displaced rib fractures. IMPRESSION: No active disease. Electronically Signed   By: Minerva Fester M.D.   On: 03/18/2024 23:22   CT Hip Right Wo Contrast Result Date: 03/18/2024 CLINICAL DATA:  Hip trauma, fracture suspected.  Fell 10 days ago. EXAM: CT OF THE RIGHT HIP WITHOUT CONTRAST TECHNIQUE: Multidetector CT imaging of the right hip was performed according to the standard protocol. Multiplanar CT image reconstructions were also generated. RADIATION DOSE REDUCTION: This exam was performed according to the departmental dose-optimization program which includes automated exposure control, adjustment of the mA and/or kV according to patient size and/or use of iterative reconstruction technique. COMPARISON:  CT abdomen pelvis 12/06/2023 FINDINGS: Bones/Joint/Cartilage  Nondisplaced mildly impacted subcapital femoral neck fracture. No dislocation. Ligaments Suboptimally assessed by CT. Muscles and Tendons No acute abnormality. Soft tissues Unremarkable. IMPRESSION: Nondisplaced mildly impacted subcapital femoral neck fracture. Electronically Signed   By: Minerva Fester M.D.   On: 03/18/2024 23:19   DG Hip Unilat W or Wo Pelvis 2-3 Views Right Result Date: 03/18/2024 CLINICAL DATA:  Hip pain. Fall 10 days ago. Patient reports right hip fracture at [facility] today. EXAM: DG HIP (WITH OR WITHOUT PELVIS) 2-3V RIGHT COMPARISON:  None Available. FINDINGS: Slight cortical irregularity about the lateral aspect of the right femoral neck may represent a nondisplaced fracture. No additional fracture of the pelvis. No hip dislocation. Mild bilateral hip osteoarthritis. Pubic rami are intact. Pubic symphysis and sacroiliac joints are congruent. IMPRESSION: Slight cortical irregularity about the lateral aspect of the right femoral neck may represent a nondisplaced fracture. Recommend further assessment with CT. Electronically Signed   By: Narda Rutherford M.D.   On: 03/18/2024 18:36     PROCEDURES:  Critical Care performed: No     Procedures    IMPRESSION / MDM / ASSESSMENT AND PLAN / ED COURSE  I reviewed the triage vital signs and the nursing notes.  Patient here with fall 10 days ago.  Has right subcapital hip fracture.     DIFFERENTIAL DIAGNOSIS (includes but not limited to):   Hip fracture, mechanical fall, denies head injury so unlikely to have intracranial hemorrhage, cervical spine fracture  Patient's presentation is most consistent with acute presentation with potential threat to life or bodily function.  PLAN: X-ray, CT of the right hip obtained here from triage and reviewed/interpreted by myself and the radiologist and confirms subcapital hip fracture.  Dr. Joice Lofts with orthopedics is already aware of patient will take her to the operating room later today per  previous ED provider.  Will discuss with hospitalist for admission.   MEDICATIONS GIVEN IN ED: Medications  fluconazole (DIFLUCAN) tablet 150 mg (has no administration in time range)  nystatin-triamcinolone ointment (MYCOLOG) (has no administration in time range)     ED COURSE: Normal hemoglobin.  Creatinine of 1.24 which is stable and baseline.  Normal electrolytes.  EKG shows no arrhythmia, ischemia.  Preop chest x-ray unremarkable.  Patient declines pain medication.  N.p.o. at this time.   CONSULTS:  Consulted and discussed patient's case with hospitalist, Dr. Arville Care.  I have recommended admission and consulting physician agrees and will place admission orders.  Patient (and family if present) agree with this plan.   I reviewed all nursing notes, vitals, pertinent previous records.  All labs, EKGs, imaging ordered have been independently reviewed and interpreted by myself.    OUTSIDE RECORDS REVIEWED: Reviewed recent PCP and orthopedic notes from yesterday.       FINAL CLINICAL IMPRESSION(S) / ED DIAGNOSES   Final diagnoses:  Subcapital fracture of hip, right, closed, initial encounter (HCC)  Yeast dermatitis     Rx / DC Orders   ED Discharge Orders     None        Note:  This document was prepared using Dragon voice recognition software and may include unintentional dictation errors.   Demetric Dunnaway, Layla Maw, DO 03/19/24 [phone]

## 2024-03-19 NOTE — Assessment & Plan Note (Addendum)
 Continue Xanax and Paxil.

## 2024-03-19 NOTE — Plan of Care (Signed)
  Problem: Education: Goal: Knowledge of General Education information will improve Description: Including pain rating scale, medication(s)/side effects and non-pharmacologic comfort measures Outcome: Progressing   Problem: Clinical Measurements: Goal: Diagnostic test results will improve Outcome: Progressing   Problem: Activity: Goal: Risk for activity intolerance will decrease Outcome: Progressing   Problem: Nutrition: Goal: Adequate nutrition will be maintained Outcome: Progressing   Problem: Coping: Goal: Level of anxiety will decrease Outcome: Progressing   Problem: Pain Managment: Goal: General experience of comfort will improve and/or be controlled Outcome: Progressing   Problem: Skin Integrity: Goal: Risk for impaired skin integrity will decrease Outcome: Progressing

## 2024-03-19 NOTE — Assessment & Plan Note (Addendum)
Hemoglobin A1c 6.1%

## 2024-03-19 NOTE — Assessment & Plan Note (Deleted)
-   The patient be placed on as needed IV labetalol. - We will continue her antihypertensive therapy.

## 2024-03-19 NOTE — H&P (Signed)
 [place]   PATIENT NAME: Erika Nelson    MR#:  [id number]  DATE OF BIRTH:  [birth year]  DATE OF ADMISSION:  03/18/2024  PRIMARY CARE PHYSICIAN: Danella Penton, MD   Patient is coming from: Home  REQUESTING/REFERRING PHYSICIAN: Ward, Layla Maw, DO  CHIEF COMPLAINT:   Chief Complaint  Patient presents with   Fall   Hip Pain    HISTORY OF PRESENT ILLNESS:  Erika Nelson is a 83 y.o. pleasant Caucasian female with medical history significant for anxiety, diverticulosis, GERD, osteoarthritis, hypertension and dyslipidemia, who presented to the emergency room with acute onset of right hip pain after having a mechanical fall.  The patient was coming out of her ophthalmology appointment walking with a cane and apparently lost balance falling on her right side.  She denied any presyncope or syncope.  No paresthesias or focal muscle weakness.  No chest pain or palpitations.  No tinnitus or vertigo.  No dysuria, oliguria or hematuria or flank pain.  No cough or wheezing or hemoptysis.  No bleeding diathesis.  No chest pain or palpitations.  ED Course: When the patient came to the ER, blood glucose was 164, BUN 30 and creatinine 1.24.  CBC was within normal. EKG as reviewed by me : EKG showed normal sinus rhythm with a rate of 78 with PACs. Imaging: Noncontrast right hip CT scan revealed mildly impacted subcapital femoral neck fracture.  Portable chest x-ray showed no acute cardiopulmonary disease.  The patient was given 150 mg of p.o. Diflucan for candidal eruption as well as Mycolog ointment.  She will be admitted to a medical-surgical bed for further evaluation and management. PAST MEDICAL HISTORY:   Past Medical History:  Diagnosis Date   Anxiety    Cervicalgia    Diverticulosis    Generalized OA    Osteoarthritis bilateral knees   GERD (gastroesophageal reflux disease)    Headache    Hyperlipidemia    Hypertension    Occasional tremors     PAST SURGICAL HISTORY:    Past Surgical History:  Procedure Laterality Date   bilateral foot surgery Bilateral    COLONOSCOPY WITH PROPOFOL N/A 03/12/2016   Procedure: COLONOSCOPY WITH PROPOFOL;  Surgeon: Christena Deem, MD;  Location: [facility] ENDOSCOPY;  Service: Endoscopy;  Laterality: N/A;   KNEE ARTHROSCOPY Left    TOTAL KNEE ARTHROPLASTY Right 07/26/2016   Procedure: TOTAL KNEE ARTHROPLASTY;  Surgeon: Kennedy Bucker, MD;  Location: [facility] ORS;  Service: Orthopedics;  Laterality: Right;    SOCIAL HISTORY:   Social History   Tobacco Use   Smoking status: Never   Smokeless tobacco: Never  Substance Use Topics   Alcohol use: No    FAMILY HISTORY:   Family History  Problem Relation Age of Onset   Colon cancer Father    Cirrhosis Mother    Breast cancer Paternal Grandmother    Stroke Brother    Breast cancer Paternal Aunt     DRUG ALLERGIES:   Allergies  Allergen Reactions   Albuterol    Norco [Hydrocodone-Acetaminophen] Other (See Comments)    "shaky"   Prozac [Fluoxetine Hcl] Other (See Comments)    "shaky"   Sulfa Antibiotics Rash   Tramadol Other (See Comments)    Sleepy    REVIEW OF SYSTEMS:   ROS As per history of present illness. All pertinent systems were reviewed above. Constitutional, HEENT, cardiovascular, respiratory, GI, GU, musculoskeletal, neuro, psychiatric, endocrine, integumentary and hematologic systems were reviewed and are  otherwise negative/unremarkable except for positive findings mentioned above in the HPI.   MEDICATIONS AT HOME:   Prior to Admission medications   Medication Sig Start Date End Date Taking? Authorizing Provider  acetaminophen (TYLENOL) 500 MG tablet Take 1,000 mg by mouth every 8 (eight) hours as needed.    [provider]  ALPRAZolam Prudy Feeler) 0.25 MG tablet Take 0.25 mg by mouth at bedtime as needed. 12/06/23   [provider]  Biotin 10 MG TABS 10 mg. 08/22/22   [provider]  bismuth subsalicylate (PEPTO BISMOL) 262  MG/15ML suspension Take 30 mLs by mouth every 6 (six) hours as needed for diarrhea or loose stools.    [provider]  Fluticasone-Umeclidin-Vilant (TRELEGY ELLIPTA) 100-62.5-25 MCG/INH AEPB Inhale 1 puff into the lungs daily at 6 (six) AM.    [provider]  glipiZIDE (GLUCOTROL XL) 5 MG 24 hr tablet Take 5 mg by mouth daily. 06/16/20   [provider]  levothyroxine (SYNTHROID, LEVOTHROID) 75 MCG tablet Take 75 mcg by mouth daily before breakfast.    [provider]  Multiple Vitamins-Minerals (CENTRUM SILVER WOMEN 50+ PO) Take 2 tablets by mouth daily at 10 pm.    [provider]  nystatin cream (MYCOSTATIN) Apply 1 application  topically 2 (two) times daily as needed for dry skin.    [provider]  olmesartan ([name]) 20 MG tablet Take 20 mg by mouth daily. 06/16/20   [provider]  omeprazole (PRILOSEC) 40 MG capsule Take 40 mg by mouth daily. 06/16/20   [provider]  PARoxetine (PAXIL) 20 MG tablet Take 20 mg by mouth daily.    [provider]  simvastatin (ZOCOR) 20 MG tablet Take 20 mg by mouth at bedtime. 06/16/20   [provider]      VITAL SIGNS:  Blood pressure (!) 171/93, pulse 72, temperature (!) 97.5 F (36.4 C), temperature source Oral, resp. rate 18, height 5\' 4"  (1.626 m), weight 83 kg, SpO2 98%.  PHYSICAL EXAMINATION:  Physical Exam  GENERAL:  83 y.o.-year-old pleasant Caucasian female patient lying in the bed with no acute distress.  EYES: Pupils equal, round, reactive to light and accommodation. No scleral icterus. Extraocular muscles intact.  HEENT: Head atraumatic, normocephalic. Oropharynx and nasopharynx clear.  NECK:  Supple, no jugular venous distention. No thyroid enlargement, no tenderness.  LUNGS: Normal breath sounds bilaterally, no wheezing, rales,rhonchi or crepitation. No use of accessory muscles of respiration.  CARDIOVASCULAR: Regular rate and rhythm, S1, S2  normal. No murmurs, rubs, or gallops.  ABDOMEN: Soft, nondistended, nontender. Bowel sounds present. No organomegaly or mass.  EXTREMITIES: No pedal edema, cyanosis, or clubbing.  NEUROLOGIC: Cranial nerves II through XII are intact. Muscle strength 5/5 in all extremities. Sensation intact. Gait not checked. Musculoskeletal: Mild right hip tenderness. PSYCHIATRIC: The patient is alert and oriented x 3.  Normal affect and good eye contact. SKIN: Candidal erythematous eruption to the suprapubic area and inguinal area.  LABORATORY PANEL:   CBC Recent Labs  Lab 03/19/24 0041  WBC 8.1  HGB 14.3  HCT 41.5  PLT 194   ------------------------------------------------------------------------------------------------------------------  Chemistries  Recent Labs  Lab 03/19/24 0041  NA 139  K 3.8  CL 106  CO2 22  GLUCOSE 164*  BUN 30*  CREATININE 1.24*  CALCIUM 9.9   ------------------------------------------------------------------------------------------------------------------  Cardiac Enzymes No results for input(s): "TROPONINI" in the last 168 hours. ------------------------------------------------------------------------------------------------------------------  RADIOLOGY:  DG Chest 1 View Result Date: 03/18/2024 CLINICAL DATA:  Preop,  fall EXAM: CHEST  1 VIEW COMPARISON:  Radiograph and CT 12/06/2023 FINDINGS: Stable cardiomediastinal silhouette. Aortic atherosclerotic calcification. No focal consolidation, pleural effusion, or pneumothorax. No displaced rib fractures. IMPRESSION: No active disease. Electronically Signed   By: Minerva Fester M.D.   On: 03/18/2024 23:22   CT Hip Right Wo Contrast Result Date: 03/18/2024 CLINICAL DATA:  Hip trauma, fracture suspected.  Fell 10 days ago. EXAM: CT OF THE RIGHT HIP WITHOUT CONTRAST TECHNIQUE: Multidetector CT imaging of the right hip was performed according to the standard protocol. Multiplanar CT image reconstructions were also  generated. RADIATION DOSE REDUCTION: This exam was performed according to the departmental dose-optimization program which includes automated exposure control, adjustment of the mA and/or kV according to patient size and/or use of iterative reconstruction technique. COMPARISON:  CT abdomen pelvis 12/06/2023 FINDINGS: Bones/Joint/Cartilage Nondisplaced mildly impacted subcapital femoral neck fracture. No dislocation. Ligaments Suboptimally assessed by CT. Muscles and Tendons No acute abnormality. Soft tissues Unremarkable. IMPRESSION: Nondisplaced mildly impacted subcapital femoral neck fracture. Electronically Signed   By: Minerva Fester M.D.   On: 03/18/2024 23:19   DG Hip Unilat W or Wo Pelvis 2-3 Views Right Result Date: 03/18/2024 CLINICAL DATA:  Hip pain. Fall 10 days ago. Patient reports right hip fracture at [facility] today. EXAM: DG HIP (WITH OR WITHOUT PELVIS) 2-3V RIGHT COMPARISON:  None Available. FINDINGS: Slight cortical irregularity about the lateral aspect of the right femoral neck may represent a nondisplaced fracture. No additional fracture of the pelvis. No hip dislocation. Mild bilateral hip osteoarthritis. Pubic rami are intact. Pubic symphysis and sacroiliac joints are congruent. IMPRESSION: Slight cortical irregularity about the lateral aspect of the right femoral neck may represent a nondisplaced fracture. Recommend further assessment with CT. Electronically Signed   By: Narda Rutherford M.D.   On: 03/18/2024 18:36      IMPRESSION AND PLAN:  Assessment and Plan: * Closed right hip fracture, initial encounter [facility]) - The patient will be admitted to a medical-surgical bed. - Pain management will be provided. - Orthopedic consult will be obtained. - Dr. Joice Lofts was notified about the patient. - The patient has no history of coronary artery disease, CHF, CVA, diabetes mellitus on insulin or renal failure with a creatinine more than 2.  She is considered at average risk for her age for  perioperative cardiovascular events given the revised cardiac risk index.  She has no current pulmonary issues.  Uncontrolled hypertension - The patient be placed on as needed IV labetalol. - We will continue her antihypertensive therapy.  Type 2 diabetes mellitus without complications (HCC) - The patient will be placed on supplemental coverage with NovoLog. - We will continue Glucotrol XL.  Dyslipidemia - We will continue statin therapy.  Anxiety and depression - We will continue Xanax and Paxil.  Hypothyroidism - We will continue Synthroid.   DVT prophylaxis: SCDs. Advanced Care Planning:  Code Status: full code. Family Communication:  The plan of care was discussed in details with the patient (and family). I answered all questions. The patient agreed to proceed with the above mentioned plan. Further management will depend upon hospital course. Disposition Plan: Back to previous home environment Consults called: Orthopedic consult. All the records are reviewed and case discussed with ED provider.  Status is: Inpatient  At the time of the admission, it appears that the appropriate admission status for this patient is inpatient.  This is judged to be reasonable and necessary in order to provide the required intensity of service to  ensure the patient's safety given the presenting symptoms, physical exam findings and initial radiographic and laboratory data in the context of comorbid conditions.  The patient requires inpatient status due to high intensity of service, high risk of further deterioration and high frequency of surveillance required.  I certify that at the time of admission, it is my clinical judgment that the patient will require inpatient hospital care extending more than 2 midnights.                            Dispo: The patient is from: Home              Anticipated d/c is to: Home              Patient currently is not medically stable to d/c.              Difficult  to place patient: No  Hannah Beat M.D on 03/19/2024 at 5:22 AM  [facility]   From 7 PM-7 AM, contact night-coverage [email or web address]  CC: Primary care physician; Danella Penton, MD

## 2024-03-19 NOTE — Transfer of Care (Signed)
 Immediate Anesthesia Transfer of Care Note  Patient: Erika Nelson  Procedure(s) Performed: PINNING, EXTREMITY, PERCUTANEOUS (Right: Hip)  Patient Location: PACU  Anesthesia Type:General  Level of Consciousness: awake, drowsy, and patient cooperative  Airway & Oxygen Therapy: Patient Spontanous Breathing and Patient connected to face mask oxygen  Post-op Assessment: Report given to RN, Post -op Vital signs reviewed and stable, and Patient moving all extremities X 4  Post vital signs: Reviewed and stable  Last Vitals:  Vitals Value Taken Time  BP 132/52 03/19/24 1419  Temp 36.9 C 03/19/24 1419  Pulse 73 03/19/24 1421  Resp 24 03/19/24 1421  SpO2 100 % 03/19/24 1421  Vitals shown include unfiled device data.  Last Pain:  Vitals:   03/19/24 0830  TempSrc:   PainSc: 0-No pain         Complications: No notable events documented.

## 2024-03-19 NOTE — Anesthesia Procedure Notes (Signed)
 Procedure Name: Intubation Date/Time: 03/19/2024 12:57 PM  Performed by: Lanell Matar, CRNAPre-anesthesia Checklist: Patient identified, Emergency Drugs available, Suction available and Patient being monitored Patient Re-evaluated:Patient Re-evaluated prior to induction Oxygen Delivery Method: Circle System Utilized Preoxygenation: Pre-oxygenation with 100% oxygen Induction Type: IV induction Ventilation: Mask ventilation without difficulty Laryngoscope Size: McGrath and 3 Grade View: Grade I Tube type: Oral Tube size: 7.0 mm Number of attempts: 1 Airway Equipment and Method: Stylet and Oral airway Placement Confirmation: ETT inserted through vocal cords under direct vision, positive ETCO2 and breath sounds checked- equal and bilateral Secured at: 21 cm Tube secured with: Tape Dental Injury: Teeth and Oropharynx as per pre-operative assessment

## 2024-03-19 NOTE — Assessment & Plan Note (Addendum)
 Continue Zocor

## 2024-03-19 NOTE — Assessment & Plan Note (Addendum)
 Continue Synthroid

## 2024-03-19 NOTE — Plan of Care (Signed)

## 2024-03-19 NOTE — Assessment & Plan Note (Addendum)
 Right hip closed reduction and pinning by Dr. Audelia Acton on 3/20.  Patient walk 6 feet with physical therapy.  Family thinking about bringing her home rather than going out to rehab.

## 2024-03-19 NOTE — Op Note (Signed)
 Patient Name: Erika Nelson  MRN: [id number]   Pre-Operative Diagnosis: Right hip impacted femoral neck fracture  Post-Operative Diagnosis: (same)  Procedure: Right hip closed reduction percutaneous pinning  Components/Implants: Zimmer 7.0 stainless screws, 95/85/85 with one washer inferiorly  Date of Surgery: 03/19/2024  Surgeon: Reinaldo Berber MD  Assistant: None   Anesthesiologist: Laural Benes  Anesthesia: General  EBL: 5  IVF: 800cc  Complications: None   Brief history: The patient is a 83 year old female who presented to [facility] emergency room after a fall and found to have an impacted right femoral neck fracture.  The patient was admitted by the medical team and optimized for surgery.  A thorough discussion was had with the patient and family about the risks and benefits of surgical intervention for their hip fracture as definitive treatment.  The patient and family opted to proceed with the operation.  All preoperative films were reviewed and an appropriate surgical plan was made prior to surgery.   Description of procedure: The patient was brought to the operating room where laterality was confirmed by all those present to be the right side.  The patient was administered anesthesia on a stretcher prior to being moved supine on the operating room table. Patient was given an intravenous dose of antibiotics for surgical prophylaxis..  All bony prominences and extremities were well padded and the patient was securely attached to the table boots, a perineal post was placed and the patient had a safety strap placed.  Surgical site was prepped with alcohol and chlorhexidine. The surgical site over the hip was and draped in typical sterile fashion with multiple layers of adhesive and nonadhesive drapes.  The incision site was marked out with a sterile marker under fluoroscopic guidance.    A surgical timeout was then called with participation of all staff in  the room the patient was then a confirmed again and laterality confirmed.  An incision was made just distal to the lesser trochanter over the lateral thigh through the skin and subcutaneous tissues. An incision was then made in the IT band in line with the skin incision and a tonsil clamp was used to spread the deep tissues over the lateral femur. A guidewire was then inserted under fluoroscopic guidance through the lateral cortex of the femur and into the inferior femoral neck just proximal to the calcar to the level of subchondral bone. The position was confirmed on lateral imaging to be centered in the femoral neck. A wire guide was then used to apply to additional guidewires in an inverted triangular pattern superior anterior and superior posterior in the femoral neck. The wires were confirmed on orthogonal fluoroscopic imaging and then measured for length.   The lateral cortex was opened around the guidewires using a cannulated drill and the screws were applied first inferior with a washer placed and then the two superior screws were placed. The placement of the screws was checked again on orthogonal imaging with fluoro and the guidewires were removed. The screws were found to be of approriate length without any penetration of the femoral head with the inferior screw at or above the level of the lesser trochanter to reduce the risk of fracture. The wound was then irrigated thoroughly and the fascia and subcutaneous tissues were injected with .25% marcaine with epi.   The fascia was closed with 0 Vicryl interrupted figure-of-eight sutures.  The subcutaneous tissues were closed with 2-0 Vicryl and the skin closed with 4-0 stratafix and Dermabond.  A  Sterile dressing was applied to the incision.   The patient was awoken from anesthesia transferred off of the operating room table onto a hospital bed.  The patient had a good pulse postoperatively in the foot . the patient was then transferred to the PACU in  stable condition.

## 2024-03-19 NOTE — Anesthesia Preprocedure Evaluation (Addendum)
 Anesthesia Evaluation  Patient identified by MRN, date of birth, ID band Patient awake and Patient confused  General Assessment Comment:Alert to self, place, and situation. Unable to correctly state date  Reviewed: Allergy & Precautions, H&P , NPO status , Patient's Chart, lab work & pertinent test results  Airway Mallampati: II  TM Distance: >3 FB Neck ROM: full    Dental  (+) Edentulous Upper, Missing   Pulmonary neg pulmonary ROS   Pulmonary exam normal        Cardiovascular Exercise Tolerance: Poor hypertension, Normal cardiovascular exam  DD   Neuro/Psych  PSYCHIATRIC DISORDERS Anxiety Depression   Dementia Mild Alzheimer's dementiaFrequent falls and uses a walker Spinal stenosis of lumbar region with neurogenic claudication 11/04/2017    GI/Hepatic negative GI ROS, Neg liver ROS,,,  Endo/Other  diabetes (diet-controlled), Type 2Hypothyroidism    Renal/GU Renal InsufficiencyRenal disease     Musculoskeletal  (+) Arthritis ,    Abdominal  (+) + obese  Peds  Hematology negative hematology ROS (+)   Anesthesia Other Findings Past Medical History: No date: Anxiety No date: Cervicalgia No date: Diverticulosis No date: Generalized OA     Comment:  Osteoarthritis bilateral knees No date: GERD (gastroesophageal reflux disease) No date: Headache No date: Hyperlipidemia No date: Hypertension No date: Occasional tremors  Past Surgical History: No date: bilateral foot surgery; Bilateral 03/12/2016: COLONOSCOPY WITH PROPOFOL; N/A     Comment:  Procedure: COLONOSCOPY WITH PROPOFOL;  Surgeon: Christena Deem, MD;  Location: [facility] ENDOSCOPY;  Service:               Endoscopy;  Laterality: N/A; No date: KNEE ARTHROSCOPY; Left 07/26/2016: TOTAL KNEE ARTHROPLASTY; Right     Comment:  Procedure: TOTAL KNEE ARTHROPLASTY;  Surgeon: Kennedy Bucker, MD;  Location: [facility] ORS;  Service: Orthopedics;                 Laterality: Right;  BMI    Body Mass Index: 31.41 kg/m      Reproductive/Obstetrics negative OB ROS                             Anesthesia Physical Anesthesia Plan  ASA: 3  Anesthesia Plan: General   Post-op Pain Management: Toradol IV (intra-op)* and Precedex   Induction: Intravenous  PONV Risk Score and Plan: 2 and Ondansetron, Dexamethasone and Midazolam  Airway Management Planned: Oral ETT and LMA  Additional Equipment:   Intra-op Plan:   Post-operative Plan: Extubation in OR  Informed Consent: I have reviewed the patients History and Physical, chart, labs and discussed the procedure including the risks, benefits and alternatives for the proposed anesthesia with the patient or authorized representative who has indicated his/her understanding and acceptance.     Dental Advisory Given  Plan Discussed with: CRNA and Surgeon  Anesthesia Plan Comments:         Anesthesia Quick Evaluation

## 2024-03-19 NOTE — Consult Note (Signed)
 ORTHOPAEDIC CONSULTATION  REQUESTING PHYSICIAN: Charise Killian, MD  Chief Complaint:   Right femoral neck fracture  History of Present Illness: Erika Nelson is a 83 y.o. female with history of hypertension, hyperlipidemia who presented to clinic yesterday with acute right hip pain which been going on for 10 days after a fall.  Patient reports she may have been dizzy 10 days ago lost her balance and landed on her right hip.  Patient reports since the fall she has had significant pain with any attempts at ambulation but has been attempting to walk through the pain with a cane.  She denies any pre-existing hip pain prior to that fall and that she walks at home mostly with a cane and is a minimal community ambulator per daughter.  Patient denies taking any blood thinners other than aspirin.  She denies any other pain, shortness of breath, numbness or tingling at this time.  Endorses right groin pain on exam.  Past Medical History:  Diagnosis Date   Anxiety    Cervicalgia    Diverticulosis    Generalized OA    Osteoarthritis bilateral knees   GERD (gastroesophageal reflux disease)    Headache    Hyperlipidemia    Hypertension    Occasional tremors    Past Surgical History:  Procedure Laterality Date   bilateral foot surgery Bilateral    COLONOSCOPY WITH PROPOFOL N/A 03/12/2016   Procedure: COLONOSCOPY WITH PROPOFOL;  Surgeon: Christena Deem, MD;  Location: [facility] ENDOSCOPY;  Service: Endoscopy;  Laterality: N/A;   KNEE ARTHROSCOPY Left    TOTAL KNEE ARTHROPLASTY Right 07/26/2016   Procedure: TOTAL KNEE ARTHROPLASTY;  Surgeon: Kennedy Bucker, MD;  Location: [facility] ORS;  Service: Orthopedics;  Laterality: Right;   Social History   Socioeconomic History   Marital status: Divorced    Spouse name: Not on file   Number of children: Not on file   Years of education: Not on file   Highest education level: Not on file   Occupational History   Not on file  Tobacco Use   Smoking status: Never   Smokeless tobacco: Never  Substance and Sexual Activity   Alcohol use: No   Drug use: No   Sexual activity: Not on file  Other Topics Concern   Not on file  Social History Narrative   Not on file   Social Drivers of Health   Financial Resource Strain: Not on file  Food Insecurity: No Food Insecurity (03/19/2024)   Hunger Vital Sign    Worried About Running Out of Food in the Last Year: Never true    Ran Out of Food in the Last Year: Never true  Transportation Needs: No Transportation Needs (03/19/2024)   PRAPARE - Administrator, Civil Service (Medical): No    Lack of Transportation (Non-Medical): No  Physical Activity: Not on file  Stress: Not on file  Social Connections: Socially Isolated (03/19/2024)   Social Connection and Isolation Panel [NHANES]    Frequency of Communication with Friends and Family: Twice a week    Frequency of Social Gatherings with Friends and Family: More than three times a week    Attends Religious Services: Never    Database administrator or Organizations: No    Attends Banker Meetings: Never    Marital Status: Divorced   Family History  Problem Relation Age of Onset   Colon cancer Father    Cirrhosis Mother    Breast cancer Paternal Grandmother  Stroke Brother    Breast cancer Paternal Aunt    Allergies  Allergen Reactions   Albuterol    Norco [Hydrocodone-Acetaminophen] Other (See Comments)    "shaky"   Prozac [Fluoxetine Hcl] Other (See Comments)    "shaky"   Sulfa Antibiotics Rash   Tramadol Other (See Comments)    Sleepy   Prior to Admission medications   Medication Sig Start Date End Date Taking? Authorizing Provider  acetaminophen (TYLENOL) 500 MG tablet Take 1,000 mg by mouth every 8 (eight) hours as needed.    [provider]  ALPRAZolam Prudy Feeler) 0.25 MG tablet Take 0.25 mg by mouth at bedtime as needed. 12/06/23    [provider]  Biotin 10 MG TABS 10 mg. 08/22/22   [provider]  bismuth subsalicylate (PEPTO BISMOL) 262 MG/15ML suspension Take 30 mLs by mouth every 6 (six) hours as needed for diarrhea or loose stools.    [provider]  Fluticasone-Umeclidin-Vilant (TRELEGY ELLIPTA) 100-62.5-25 MCG/INH AEPB Inhale 1 puff into the lungs daily at 6 (six) AM.    [provider]  glipiZIDE (GLUCOTROL XL) 5 MG 24 hr tablet Take 5 mg by mouth daily. 06/16/20   [provider]  levothyroxine (SYNTHROID, LEVOTHROID) 75 MCG tablet Take 75 mcg by mouth daily before breakfast.    [provider]  Multiple Vitamins-Minerals (CENTRUM SILVER WOMEN 50+ PO) Take 2 tablets by mouth daily at 10 pm.    [provider]  nystatin cream (MYCOSTATIN) Apply 1 application  topically 2 (two) times daily as needed for dry skin.    [provider]  olmesartan ([name]) 20 MG tablet Take 20 mg by mouth daily. 06/16/20   [provider]  omeprazole (PRILOSEC) 40 MG capsule Take 40 mg by mouth daily. 06/16/20   [provider]  PARoxetine (PAXIL) 20 MG tablet Take 20 mg by mouth daily.    [provider]  simvastatin (ZOCOR) 20 MG tablet Take 20 mg by mouth at bedtime. 06/16/20   [provider]   DG Chest 1 View Result Date: 03/18/2024 CLINICAL DATA:  Preop, fall EXAM: CHEST  1 VIEW COMPARISON:  Radiograph and CT 12/06/2023 FINDINGS: Stable cardiomediastinal silhouette. Aortic atherosclerotic calcification. No focal consolidation, pleural effusion, or pneumothorax. No displaced rib fractures. IMPRESSION: No active disease. Electronically Signed   By: Minerva Fester M.D.   On: 03/18/2024 23:22   CT Hip Right Wo Contrast Result Date: 03/18/2024 CLINICAL DATA:  Hip trauma, fracture suspected.  Fell 10 days ago. EXAM: CT OF THE RIGHT HIP WITHOUT CONTRAST TECHNIQUE: Multidetector CT imaging of the right hip was performed according to the  standard protocol. Multiplanar CT image reconstructions were also generated. RADIATION DOSE REDUCTION: This exam was performed according to the departmental dose-optimization program which includes automated exposure control, adjustment of the mA and/or kV according to patient size and/or use of iterative reconstruction technique. COMPARISON:  CT abdomen pelvis 12/06/2023 FINDINGS: Bones/Joint/Cartilage Nondisplaced mildly impacted subcapital femoral neck fracture. No dislocation. Ligaments Suboptimally assessed by CT. Muscles and Tendons No acute abnormality. Soft tissues Unremarkable. IMPRESSION: Nondisplaced mildly impacted subcapital femoral neck fracture. Electronically Signed   By: Minerva Fester M.D.   On: 03/18/2024 23:19   DG Hip Unilat W or Wo Pelvis 2-3 Views Right Result Date: 03/18/2024 CLINICAL DATA:  Hip pain. Fall 10 days ago. Patient reports right hip fracture at [facility] today. EXAM: DG HIP (WITH OR WITHOUT PELVIS) 2-3V RIGHT COMPARISON:  None Available. FINDINGS: Slight cortical irregularity about  the lateral aspect of the right femoral neck may represent a nondisplaced fracture. No additional fracture of the pelvis. No hip dislocation. Mild bilateral hip osteoarthritis. Pubic rami are intact. Pubic symphysis and sacroiliac joints are congruent. IMPRESSION: Slight cortical irregularity about the lateral aspect of the right femoral neck may represent a nondisplaced fracture. Recommend further assessment with CT. Electronically Signed   By: Narda Rutherford M.D.   On: 03/18/2024 18:36    Positive ROS: All other systems have been reviewed and were otherwise negative with the exception of those mentioned in the HPI and as above.  Physical Exam: General:  Alert, no acute distress Psychiatric:  Patient is competent for consent with normal mood and affect   Cardiovascular:  No pedal edema Respiratory:  No wheezing, non-labored breathing GI:  Abdomen is soft and non-tender Skin:  No lesions in  the area of chief complaint Neurologic:  Sensation intact distally Lymphatic:  No axillary or cervical lymphadenopathy  Orthopedic Exam:  Right lower extremity Skin intact over the hip minimal tenderness palpation over the hip Tender to logroll and gentle axial load of the hip Neurovascular intact with no tenderness to palpation over the distal femur, knee, tibia, ankle or foot Able to dorsiflex and plantarflex the foot and toes with intact dorsalis pedis pulse compartments all soft  Secondary survey No tenderness to palpation over other bony prominences in the lower extremities or bilateral upper extremities No pain with logroll or simulated axial loading of the left lower extremity All compartments soft No tenderness to palpation over the cervical or thoracic spine, no bony step-off Motor grossly intact throughout, no focal deficits Sensation grossly intact throughout, no focal deficits Good distal pulses and capillary refill on all extremities   X-rays:  X-rays and CT scan reviewed which show a effectively nondisplaced impacted right femoral neck fracture.  Mild degenerative changes with preservation of joint space and some osteophytes along around the femoral head.  Similar appearance of the left hip no fractures noted around the pelvis or the left hip.  Agree with radiology interpretation.  Assessment: Right impacted femoral neck fracture subacute  Plan: The patient is an 83 year old female presents with an impacted right femoral neck fracture.  I discussed the treatment options including operative and nonoperative options with the patient and the patient's daughter.  Given the impacted nature of the fracture and the fact that she has been walking on for 10 days it does appear to be relatively stable.  She had no pre-existing hip pain prior to this injury and while there is mild arthritis in the hip I do think her ongoing acute issues secondary to the fracture.  We discussed hip  pinning as a method of fracture fixation for her injury.  We also discussed the possibility of hip replacement.  Given that she did not have pre-existing pain and that she is her been walking on it and it has proven to [place] to be somewhat stable I do think it is reasonable to attempt a hip pinning as it is in good position for pins on the CT scan.  The patient and family agree with this plan and under shared decision making model would like to proceed with surgery on the right hip with a hip pinning.  A long discussion took place with the patient describing what a hip pinning is and what the procedure would entail. The xrays were reviewed with the patient and the implants were discussed. The ability to secure the implant utilizing screws  fixation was discussed. Surgical exposures were discussed with the patient.    The hospitalization and post-operative care and rehabilitation were also discussed. The use of perioperative antibiotics and DVT prophylaxis were discussed. The risk, benefits and alternatives to a surgical intervention were discussed at length with the patient. The patient was also advised of risks related to the medical comorbidities. A lengthy discussion took place to review the most common complications including but not limited to: deep vein thrombosis, pulmonary embolus, heart attack, stroke, infection, wound breakdown, dislocation, numbness, leg length in-equality, damage to nerves, intraoperative fracture, screw cut out, screw failure, need to convert to total hip arthroplasty, malunion/ nonunion, tendon,muscles, arteries or other blood vessels, death and other possible complications from anesthesia. The patient was told that we will take steps to minimize these risks by using sterile technique, antibiotics and DVT prophylaxis when appropriate and follow the patient postoperatively in the office setting to monitor progress. The possibility of recurrent pain, no improvement in pain and actual  worsening of pain were also discussed with the patient.    The benefits of surgery were discussed with the patient including the potential for improving the patient's current clinical condition through operative intervention. Alternatives to surgical intervention including conservative management were also discussed in detail. All questions were answered to the satisfaction of the patient. The patient participated and agreed to the plan of care as well as the use of the recommended implants for their surgery.    Plan for surgery today 03/19/2024 N.p.o.  for the operating room Hold anticoagulation  Will fill out consent and mark the hip in the preoperative holding room  Reinaldo Berber MD     Reinaldo Berber MD  Beeper #:  [phone]  03/19/2024 10:18 AM

## 2024-03-19 NOTE — Progress Notes (Signed)
 Initial Nutrition Assessment  DOCUMENTATION CODES:   Obesity unspecified  INTERVENTION:   -Once diet is advanced, add:   -MVI with minerals daily -Magic cup BID with meals, each supplement provides 290 kcal and 9 grams of protein   NUTRITION DIAGNOSIS:   Increased nutrient needs related to post-op healing as evidenced by estimated needs.  GOAL:   Patient will meet greater than or equal to 90% of their needs  MONITOR:   PO intake, Supplement acceptance, Diet advancement  REASON FOR ASSESSMENT:   Consult Assessment of nutrition requirement/status, Hip fracture protocol  ASSESSMENT:   Pt with medical history significant for anxiety, diverticulosis, GERD, osteoarthritis, hypertension and dyslipidemia, who presented with acute onset of right hip pain after having a mechanical fall.  Pt admitted with closed rt hip fracture.   3/20- s/p Procedure: Right hip closed reduction percutaneous pinning   Reviewed I/O's: -200 ml x 24 hours  UOP: 200 ml x 24 hours  Spoke with pt at bedside, who was pleasant and in good spirits today. She is eager to have surgery, as she has not eaten since yesterday. She is understanding of NPO status.   Pt consumes 3 meals PTA and had a good appetite. Pt explains that she lives with her grandson, who does cooking for her. Pt has started eating more healthfully (chicken and vegetables) as she reports she has been eating a lot of hot dogs and hamburgers prior to this. Pt has top dentures, which she has access to, and uses when she eats. She denies any chewing or swallowing issues.   Pt denies any weight loss.  Discussed importance of good meal and supplement intake to promote healing. OPt amenable to supplements.   Medications reviewed and include protonix.   Lab Results  Component Value Date   HGBA1C 6.1 (H) 03/19/2024   PTA DM medications are 5 mg glip[izide daily.   Labs reviewed: CBGS: 123-133 (inpatient orders for glycemic control are 0-5  units insulin aspart TID with meals and 0-9 units insulin aspart TID with meals).    NUTRITION - FOCUSED PHYSICAL EXAM:  Flowsheet Row Most Recent Value  Orbital Region No depletion  Upper Arm Region Mild depletion  Thoracic and Lumbar Region No depletion  Buccal Region No depletion  Temple Region No depletion  Clavicle Bone Region No depletion  Clavicle and Acromion Bone Region No depletion  Scapular Bone Region No depletion  Dorsal Hand No depletion  Patellar Region No depletion  Anterior Thigh Region No depletion  Posterior Calf Region No depletion  Edema (RD Assessment) Mild  Hair Reviewed  Eyes Reviewed  Mouth Reviewed  Skin Reviewed  Nails Reviewed       Diet Order:   Diet Order             Diet NPO time specified Except for: Sips with Meds  Diet effective now                   EDUCATION NEEDS:   Education needs have been addressed  Skin:  Skin Assessment: Skin Integrity Issues: Skin Integrity Issues:: Incisions Incisions: closed rt hip  Last BM:  03/18/24  Height:   Ht Readings from Last 1 Encounters:  03/18/24 5\' 4"  (1.626 m)    Weight:   Wt Readings from Last 1 Encounters:  03/18/24 83 kg    Ideal Body Weight:  54.5 kg  BMI:  Body mass index is 31.41 kg/m.  Estimated Nutritional Needs:   Kcal:  1700-1900  Protein:  90-105 grams  Fluid:  > 1.7 L    Levada Schilling, RD, LDN, CDCES Registered Dietitian III Certified Diabetes Care and Education Specialist If unable to reach this RD, please use "RD Inpatient" group chat on secure chat between hours of 8am-4 pm daily

## 2024-03-19 NOTE — Progress Notes (Signed)
 PROGRESS NOTE    Erika Nelson  BJY:[id number] DOB: [birth year] DOA: 03/18/2024 PCP: Danella Penton, MD   Assessment & Plan:   Principal Problem:   Closed right hip fracture, initial encounter [facility]) Active Problems:   Uncontrolled hypertension   Hypothyroidism   Anxiety and depression   Dyslipidemia   Type 2 diabetes mellitus without complications (HCC)  Assessment and Plan: Closed right hip fracture: s/p right hip closed reduction percutaneous pinning 03/19/24 as per ortho surg. Oxy, morphine prn for pain   HTN: continue on irbesartan,    DM2: well controlled, HbA1c 6.0. Continue on SSI w/ accuchecks   HLD: continue on statin    Depression: severity unknown. Continue on home dose of paroxetine   Anxiety: severity unknown. Continue on home dose of xanax    Hypothyroidism: continue on home dose of synthroid   Obesity: BMI 31.4. Would benefit from weight loss     DVT prophylaxis: SCDs Code Status: full  Family Communication: discussed pt's care w/ pt's granddaughter at bedside and answered her questions  Disposition Plan: depends on PT/OT recs (not consulted yet)  Level of care: Med-Surg   Status is: Inpatient Remains inpatient appropriate because: surg today    Consultants:  Ortho surg   Procedures:   Antimicrobials:    Subjective: Pt c/o hip pain   Objective: Vitals:   03/18/24 1715 03/19/24 0050 03/19/24 0222 03/19/24 0315  BP:  (!) 172/83 (!) 159/89 (!) 171/93  Pulse:  71 69 72  Resp:  18 17 18   Temp:  97.7 F (36.5 C) 97.6 F (36.4 C) (!) 97.5 F (36.4 C)  TempSrc:  Oral Oral Oral  SpO2:  97% 95% 98%  Weight: 83 kg     Height: 5\' 4"  (1.626 m)       Intake/Output Summary (Last 24 hours) at 03/19/2024 0848 Last data filed at 03/19/2024 0700 Gross per 24 hour  Intake --  Output 200 ml  Net -200 ml   Filed Weights   03/18/24 1711 03/18/24 1715  Weight: 83 kg 83 kg    Examination:  General exam: Appears calm and comfortable   Respiratory system: Clear to auscultation. Respiratory effort normal. Cardiovascular system: S1 & S2 +. No rubs, gallops or clicks.  Gastrointestinal system: Abdomen is obese, soft and nontender.  Normal bowel sounds heard. Central nervous system: Alert and oriented. Moves all extremities Psychiatry: Judgement and insight appear normal. Mood & affect appropriate.     Data Reviewed: I have personally reviewed following labs and imaging studies  CBC: Recent Labs  Lab 03/19/24 0041 03/19/24 0526  WBC 8.1 7.5  HGB 14.3 14.1  HCT 41.5 40.0  MCV 87.4 84.6  PLT 194 206   Basic Metabolic Panel: Recent Labs  Lab 03/19/24 0041 03/19/24 0526  NA 139 140  K 3.8 3.8  CL 106 105  CO2 22 24  GLUCOSE 164* 103*  BUN 30* 30*  CREATININE 1.24* 1.09*  CALCIUM 9.9 9.9   GFR: Estimated Creatinine Clearance: 42.2 mL/min (A) (by C-G formula based on SCr of 1.09 mg/dL (H)). Liver Function Tests: No results for input(s): "AST", "ALT", "ALKPHOS", "BILITOT", "PROT", "ALBUMIN" in the last 168 hours. No results for input(s): "LIPASE", "AMYLASE" in the last 168 hours. No results for input(s): "AMMONIA" in the last 168 hours. Coagulation Profile: No results for input(s): "INR", "PROTIME" in the last 168 hours. Cardiac Enzymes: No results for input(s): "CKTOTAL", "CKMB", "CKMBINDEX", "TROPONINI" in the last 168 hours. BNP (last 3 results)  No results for input(s): "PROBNP" in the last 8760 hours. HbA1C: No results for input(s): "HGBA1C" in the last 72 hours. CBG: Recent Labs  Lab 03/19/24 0655  GLUCAP 123*   Lipid Profile: No results for input(s): "CHOL", "HDL", "LDLCALC", "TRIG", "CHOLHDL", "LDLDIRECT" in the last 72 hours. Thyroid Function Tests: No results for input(s): "TSH", "T4TOTAL", "FREET4", "T3FREE", "THYROIDAB" in the last 72 hours. Anemia Panel: No results for input(s): "VITAMINB12", "FOLATE", "FERRITIN", "TIBC", "IRON", "RETICCTPCT" in the last 72 hours. Sepsis Labs: No  results for input(s): "PROCALCITON", "LATICACIDVEN" in the last 168 hours.  Recent Results (from the past 240 hours)  MRSA Next Gen by PCR, Nasal     Status: None   Collection Time: 03/19/24  6:47 AM   Specimen: Nasal Mucosa; Nasal Swab  Result Value Ref Range Status   MRSA by PCR Next Gen NOT DETECTED NOT DETECTED Final    Comment: (NOTE) The GeneXpert MRSA Assay (FDA approved for NASAL specimens only), is one component of a comprehensive MRSA colonization surveillance program. It is not intended to diagnose MRSA infection nor to guide or monitor treatment for MRSA infections. Test performance is not FDA approved in patients less than 62 years old. Performed at [facility], [address], [place], [place] [address]          Radiology Studies: DG Chest 1 View Result Date: 03/18/2024 CLINICAL DATA:  Preop, fall EXAM: CHEST  1 VIEW COMPARISON:  Radiograph and CT 12/06/2023 FINDINGS: Stable cardiomediastinal silhouette. Aortic atherosclerotic calcification. No focal consolidation, pleural effusion, or pneumothorax. No displaced rib fractures. IMPRESSION: No active disease. Electronically Signed   By: Minerva Fester M.D.   On: 03/18/2024 23:22   CT Hip Right Wo Contrast Result Date: 03/18/2024 CLINICAL DATA:  Hip trauma, fracture suspected.  Fell 10 days ago. EXAM: CT OF THE RIGHT HIP WITHOUT CONTRAST TECHNIQUE: Multidetector CT imaging of the right hip was performed according to the standard protocol. Multiplanar CT image reconstructions were also generated. RADIATION DOSE REDUCTION: This exam was performed according to the departmental dose-optimization program which includes automated exposure control, adjustment of the mA and/or kV according to patient size and/or use of iterative reconstruction technique. COMPARISON:  CT abdomen pelvis 12/06/2023 FINDINGS: Bones/Joint/Cartilage Nondisplaced mildly impacted subcapital femoral neck fracture. No dislocation. Ligaments  Suboptimally assessed by CT. Muscles and Tendons No acute abnormality. Soft tissues Unremarkable. IMPRESSION: Nondisplaced mildly impacted subcapital femoral neck fracture. Electronically Signed   By: Minerva Fester M.D.   On: 03/18/2024 23:19   DG Hip Unilat W or Wo Pelvis 2-3 Views Right Result Date: 03/18/2024 CLINICAL DATA:  Hip pain. Fall 10 days ago. Patient reports right hip fracture at [facility] today. EXAM: DG HIP (WITH OR WITHOUT PELVIS) 2-3V RIGHT COMPARISON:  None Available. FINDINGS: Slight cortical irregularity about the lateral aspect of the right femoral neck may represent a nondisplaced fracture. No additional fracture of the pelvis. No hip dislocation. Mild bilateral hip osteoarthritis. Pubic rami are intact. Pubic symphysis and sacroiliac joints are congruent. IMPRESSION: Slight cortical irregularity about the lateral aspect of the right femoral neck may represent a nondisplaced fracture. Recommend further assessment with CT. Electronically Signed   By: Narda Rutherford M.D.   On: 03/18/2024 18:36        Scheduled Meds:  fluticasone furoate-vilanterol  1 puff Inhalation Daily   glipiZIDE  5 mg Oral Daily   insulin aspart  0-5 Units Subcutaneous QHS   insulin aspart  0-9 Units Subcutaneous TID WC   irbesartan  37.5 mg Oral Daily   [START ON 03/20/2024] levothyroxine  75 mcg Oral QAC breakfast   multivitamin with minerals  1 tablet Oral Q2200   pantoprazole  40 mg Oral Daily   PARoxetine  20 mg Oral Daily   simvastatin  20 mg Oral QHS   Continuous Infusions:   ceFAZolin (ANCEF) IV       LOS: 0 days       Charise Killian, MD [facility] Pager [phone]  If 7PM-7AM, please contact night-coverage [email or web address] 03/19/2024, 8:48 AM

## 2024-03-20 ENCOUNTER — Encounter: Payer: Self-pay | Admitting: Orthopedic Surgery

## 2024-03-20 DIAGNOSIS — S72001A Fracture of unspecified part of neck of right femur, initial encounter for closed fracture: Secondary | ICD-10-CM | POA: Diagnosis not present

## 2024-03-20 LAB — CBC
HCT: 35.7 % — ABNORMAL LOW (ref 36.0–46.0)
Hemoglobin: 12.6 g/dL (ref 12.0–15.0)
MCH: 29.8 pg (ref 26.0–34.0)
MCHC: 35.3 g/dL (ref 30.0–36.0)
MCV: 84.4 fL (ref 80.0–100.0)
Platelets: 204 10*3/uL (ref 150–400)
RBC: 4.23 MIL/uL (ref 3.87–5.11)
RDW: 12.8 % (ref 11.5–15.5)
WBC: 11.2 10*3/uL — ABNORMAL HIGH (ref 4.0–10.5)
nRBC: 0 % (ref 0.0–0.2)

## 2024-03-20 LAB — GLUCOSE, CAPILLARY
Glucose-Capillary: 153 mg/dL — ABNORMAL HIGH (ref 70–99)
Glucose-Capillary: 182 mg/dL — ABNORMAL HIGH (ref 70–99)
Glucose-Capillary: 123 mg/dL — ABNORMAL HIGH (ref 70–99)
Glucose-Capillary: 186 mg/dL — ABNORMAL HIGH (ref 70–99)

## 2024-03-20 LAB — BASIC METABOLIC PANEL
Anion gap: 10 (ref 5–15)
BUN: 30 mg/dL — ABNORMAL HIGH (ref 8–23)
CO2: 22 mmol/L (ref 22–32)
Calcium: 9.1 mg/dL (ref 8.9–10.3)
Chloride: 105 mmol/L (ref 98–111)
Creatinine, Ser: 1.39 mg/dL — ABNORMAL HIGH (ref 0.44–1.00)
GFR, Estimated: 38 mL/min — ABNORMAL LOW (ref >60)
Glucose, Bld: 182 mg/dL — ABNORMAL HIGH (ref 70–99)
Potassium: 4.4 mmol/L (ref 3.5–5.1)
Sodium: 137 mmol/L (ref 135–145)

## 2024-03-20 MED ORDER — ENOXAPARIN SODIUM 40 MG/0.4ML IJ SOSY: 40.0000 mg | PREFILLED_SYRINGE | INTRAMUSCULAR | 0 refills | Status: DC

## 2024-03-20 MED ORDER — ACETAMINOPHEN 500 MG PO TABS
500.0000 mg | ORAL_TABLET | Freq: Four times a day (QID) | ORAL | Status: AC
Start: 2024-03-20 — End: ?

## 2024-03-20 MED ORDER — DOCUSATE SODIUM 100 MG PO CAPS: 100.0000 mg | ORAL_CAPSULE | Freq: Two times a day (BID) | ORAL | Status: DC

## 2024-03-20 MED ORDER — OXYCODONE HCL 5 MG PO TABS
2.5000 mg | ORAL_TABLET | Freq: Four times a day (QID) | ORAL | 0 refills | Status: DC | PRN
Start: 2024-03-20 — End: 2024-03-26

## 2024-03-20 NOTE — Plan of Care (Signed)
  Problem: Education: Goal: Knowledge of General Education information will improve Description: Including pain rating scale, medication(s)/side effects and non-pharmacologic comfort measures Outcome: Progressing   Problem: Health Behavior/Discharge Planning: Goal: Ability to manage health-related needs will improve Outcome: Progressing   Problem: Clinical Measurements: Goal: Ability to maintain clinical measurements within normal limits will improve Outcome: Progressing Goal: Will remain free from infection Outcome: Progressing Goal: Diagnostic test results will improve Outcome: Progressing Goal: Respiratory complications will improve Outcome: Progressing Goal: Cardiovascular complication will be avoided Outcome: Progressing   Problem: Activity: Goal: Risk for activity intolerance will decrease Outcome: Progressing   Problem: Nutrition: Goal: Adequate nutrition will be maintained Outcome: Progressing   Problem: Coping: Goal: Level of anxiety will decrease Outcome: Progressing   Problem: Elimination: Goal: Will not experience complications related to bowel motility Outcome: Progressing Goal: Will not experience complications related to urinary retention Outcome: Progressing   Problem: Pain Managment: Goal: General experience of comfort will improve and/or be controlled Outcome: Progressing   Problem: Safety: Goal: Ability to remain free from injury will improve Outcome: Progressing   Problem: Skin Integrity: Goal: Risk for impaired skin integrity will decrease Outcome: Progressing   Problem: Education: Goal: Ability to describe self-care measures that may prevent or decrease complications (Diabetes Survival Skills Education) will improve Outcome: Progressing Goal: Individualized Educational Video(s) Outcome: Progressing   Problem: Coping: Goal: Ability to adjust to condition or change in health will improve Outcome: Progressing   Problem: Fluid  Volume: Goal: Ability to maintain a balanced intake and output will improve Outcome: Progressing   Problem: Health Behavior/Discharge Planning: Goal: Ability to identify and utilize available resources and services will improve Outcome: Progressing Goal: Ability to manage health-related needs will improve Outcome: Progressing   Problem: Metabolic: Goal: Ability to maintain appropriate glucose levels will improve Outcome: Progressing   Problem: Nutritional: Goal: Maintenance of adequate nutrition will improve Outcome: Progressing Goal: Progress toward achieving an optimal weight will improve Outcome: Progressing   Problem: Skin Integrity: Goal: Risk for impaired skin integrity will decrease Outcome: Progressing   Problem: Tissue Perfusion: Goal: Adequacy of tissue perfusion will improve Outcome: Progressing   Problem: Education: Goal: Verbalization of understanding the information provided (i.e., activity precautions, restrictions, etc) will improve Outcome: Progressing Goal: Individualized Educational Video(s) Outcome: Progressing   Problem: Activity: Goal: Ability to ambulate and perform ADLs will improve Outcome: Progressing   Problem: Clinical Measurements: Goal: Postoperative complications will be avoided or minimized Outcome: Progressing   Problem: Self-Concept: Goal: Ability to maintain and perform role responsibilities to the fullest extent possible will improve Outcome: Progressing   Problem: Pain Management: Goal: Pain level will decrease Outcome: Progressing

## 2024-03-20 NOTE — Discharge Summary (Signed)
 Physician Discharge Summary  Erika Nelson ZOX:[id number] DOB: 16-Feb-1941 DOA: 03/18/2024  PCP: Danella Penton, MD  Admit date: 03/18/2024 Discharge date: 03/20/2024  Admitted From: home Disposition: home w/ home health   Recommendations for Outpatient Follow-up:  Follow up with PCP in 1-2 weeks F/u w/ ortho surg, Dr. Audelia Acton or PA Floyce Stakes, in 2 weeks  Home Health: yes Equipment/Devices: walker  Discharge Condition: stable CODE STATUS:full Diet recommendation:Carb Modified  Brief/Interim Summary: HPI was taken from Dr. Arville Care: Erika Nelson is a 83 y.o. pleasant Caucasian female with medical history significant for anxiety, diverticulosis, GERD, osteoarthritis, hypertension and dyslipidemia, who presented to the emergency room with acute onset of right hip pain after having a mechanical fall.  The patient was coming out of her ophthalmology appointment walking with a cane and apparently lost balance falling on her right side.  She denied any presyncope or syncope.  No paresthesias or focal muscle weakness.  No chest pain or palpitations.  No tinnitus or vertigo.  No dysuria, oliguria or hematuria or flank pain.  No cough or wheezing or hemoptysis.  No bleeding diathesis.  No chest pain or palpitations.   ED Course: When the patient came to the ER, blood glucose was 164, BUN 30 and creatinine 1.24.  CBC was within normal. EKG as reviewed by me : EKG showed normal sinus rhythm with a rate of 78 with PACs. Imaging: Noncontrast right hip CT scan revealed mildly impacted subcapital femoral neck fracture.  Portable chest x-ray showed no acute cardiopulmonary disease.   The patient was given 150 mg of p.o. Diflucan for candidal eruption as well as Mycolog ointment.  She will be admitted to a medical-surgical bed for further evaluation and management.  Discharge Diagnoses:  Principal Problem:   Closed right hip fracture, initial encounter [facility]) Active Problems:   Uncontrolled hypertension    Hypothyroidism   Anxiety and depression   Dyslipidemia   Type 2 diabetes mellitus without complications (HCC)   Subcapital fracture of hip, right, closed, initial encounter (HCC)  Closed right hip fracture: s/p right hip closed reduction percutaneous pinning 03/19/24 as per ortho surg. Oxy, morphine prn for pain. Lovenox for DVT ppx as per ortho surg   HTN: continue on irbesartan     DM2: well controlled, HbA1c 6.0. Continue on SSI w/ accuchecks    HLD: continue on statin    Depression: severity unknown. Continue on home dose of paroxetine    Anxiety: severity unknown. Continue on home dose of xanax    Hypothyroidism: continue on home dose of levothyroxine    Obesity: BMI 31.4. Would benefit from weight loss   Discharge Instructions  Discharge Instructions     Diet Carb Modified   Complete by: As directed    Discharge instructions   Complete by: As directed    F/u w/ ortho surg, Dr. Audelia Acton, in 2 weeks. F/u w/ PCP in 1-2 weeks   Increase activity slowly   Complete by: As directed       Allergies as of 03/20/2024       Reactions   Albuterol    Norco [hydrocodone-acetaminophen] Other (See Comments)   "shaky"   Prozac [fluoxetine Hcl] Other (See Comments)   "shaky"   Sulfa Antibiotics Rash   Tramadol Other (See Comments)   Sleepy        Medication List     TAKE these medications    acetaminophen 500 MG tablet Commonly known as: TYLENOL Take 1 tablet (500 mg total)  by mouth every 6 (six) hours. What changed:  how much to take when to take this reasons to take this   ALPRAZolam 0.25 MG tablet Commonly known as: XANAX Take 0.25 mg by mouth at bedtime as needed.   Biotin 10 MG Tabs 10 mg.   bismuth subsalicylate 262 MG/15ML suspension Commonly known as: PEPTO BISMOL Take 30 mLs by mouth every 6 (six) hours as needed for diarrhea or loose stools.   CENTRUM SILVER WOMEN 50+ PO Take 2 tablets by mouth daily at 10 pm.   docusate sodium 100 MG  capsule Commonly known as: COLACE Take 1 capsule (100 mg total) by mouth 2 (two) times daily.   enoxaparin 40 MG/0.4ML injection Commonly known as: LOVENOX Inject 0.4 mLs (40 mg total) into the skin daily for 14 days. Start taking on: March 21, 2024   glipiZIDE 5 MG 24 hr tablet Commonly known as: GLUCOTROL XL Take 5 mg by mouth daily.   levothyroxine 75 MCG tablet Commonly known as: SYNTHROID Take 75 mcg by mouth daily before breakfast.   nystatin cream Commonly known as: MYCOSTATIN Apply 1 application  topically 2 (two) times daily as needed for dry skin.   olmesartan 20 MG tablet Commonly known as: [name] Take 20 mg by mouth daily.   omeprazole 40 MG capsule Commonly known as: PRILOSEC Take 40 mg by mouth daily.   oxyCODONE 5 MG immediate release tablet Commonly known as: Oxy IR/ROXICODONE Take 0.5-1 tablets (2.5-5 mg total) by mouth every 6 (six) hours as needed for breakthrough pain ((for MODERATE breakthrough pain)).   PARoxetine 20 MG tablet Commonly known as: PAXIL Take 20 mg by mouth daily.   simvastatin 20 MG tablet Commonly known as: ZOCOR Take 20 mg by mouth at bedtime.   Trelegy Ellipta 100-62.5-25 MCG/INH Aepb Generic drug: Fluticasone-Umeclidin-Vilant Inhale 1 puff into the lungs daily at 6 (six) AM.               Durable Medical Equipment  (From admission, onward)           Start     Ordered   03/20/24 1114  For home use only DME Walker rolling  Once       Question Answer Comment  Walker: With 5 Inch Wheels   Patient needs a walker to treat with the following condition Generalized weakness      03/20/24 1113            Follow-up Information     Evon Slack, PA-C Follow up in 2 week(s).   Specialties: Orthopedic Surgery, Emergency Medicine Contact information: [address] [place] [address] [phone]                Allergies  Allergen Reactions   Albuterol    Norco  [Hydrocodone-Acetaminophen] Other (See Comments)    "shaky"   Prozac [Fluoxetine Hcl] Other (See Comments)    "shaky"   Sulfa Antibiotics Rash   Tramadol Other (See Comments)    Sleepy    Consultations: Ortho surg   Procedures/Studies: DG HIP UNILAT WITH PELVIS 2-3 VIEWS RIGHT Result Date: 03/19/2024 CLINICAL DATA:  Elective surgery. EXAM: DG HIP (WITH OR WITHOUT PELVIS) 2-3V RIGHT COMPARISON:  Preoperative imaging FINDINGS: Two fluoroscopic spot views of the right hip submitted from the operating room. Three screws traverse femoral neck fracture. Fluoroscopy time 84 seconds. Dose 32.22 mGy. IMPRESSION: Intraoperative fluoroscopy during right femoral neck fracture fixation. Electronically Signed   By: Ivette Loyal.D.  On: 03/19/2024 15:56   DG Knee Right Port Result Date: 03/19/2024 CLINICAL DATA:  Postop.  Right knee pain. EXAM: PORTABLE RIGHT KNEE - 1-2 VIEW COMPARISON:  07/26/2016 FINDINGS: Right knee arthroplasty in expected alignment. No acute or periprosthetic fracture. No periprosthetic lucency. Patellar resurfacing. No significant joint effusion. No erosive change. IMPRESSION: Right knee arthroplasty without complication. Electronically Signed   By: Narda Rutherford M.D.   On: 03/19/2024 15:54   DG C-Arm 1-60 Min-No Report Result Date: 03/19/2024 Fluoroscopy was utilized by the requesting physician.  No radiographic interpretation.   DG C-Arm 1-60 Min-No Report Result Date: 03/19/2024 Fluoroscopy was utilized by the requesting physician.  No radiographic interpretation.   DG Chest 1 View Result Date: 03/18/2024 CLINICAL DATA:  Preop, fall EXAM: CHEST  1 VIEW COMPARISON:  Radiograph and CT 12/06/2023 FINDINGS: Stable cardiomediastinal silhouette. Aortic atherosclerotic calcification. No focal consolidation, pleural effusion, or pneumothorax. No displaced rib fractures. IMPRESSION: No active disease. Electronically Signed   By: Minerva Fester M.D.   On: 03/18/2024 23:22    CT Hip Right Wo Contrast Result Date: 03/18/2024 CLINICAL DATA:  Hip trauma, fracture suspected.  Fell 10 days ago. EXAM: CT OF THE RIGHT HIP WITHOUT CONTRAST TECHNIQUE: Multidetector CT imaging of the right hip was performed according to the standard protocol. Multiplanar CT image reconstructions were also generated. RADIATION DOSE REDUCTION: This exam was performed according to the departmental dose-optimization program which includes automated exposure control, adjustment of the mA and/or kV according to patient size and/or use of iterative reconstruction technique. COMPARISON:  CT abdomen pelvis 12/06/2023 FINDINGS: Bones/Joint/Cartilage Nondisplaced mildly impacted subcapital femoral neck fracture. No dislocation. Ligaments Suboptimally assessed by CT. Muscles and Tendons No acute abnormality. Soft tissues Unremarkable. IMPRESSION: Nondisplaced mildly impacted subcapital femoral neck fracture. Electronically Signed   By: Minerva Fester M.D.   On: 03/18/2024 23:19   DG Hip Unilat W or Wo Pelvis 2-3 Views Right Result Date: 03/18/2024 CLINICAL DATA:  Hip pain. Fall 10 days ago. Patient reports right hip fracture at [facility] today. EXAM: DG HIP (WITH OR WITHOUT PELVIS) 2-3V RIGHT COMPARISON:  None Available. FINDINGS: Slight cortical irregularity about the lateral aspect of the right femoral neck may represent a nondisplaced fracture. No additional fracture of the pelvis. No hip dislocation. Mild bilateral hip osteoarthritis. Pubic rami are intact. Pubic symphysis and sacroiliac joints are congruent. IMPRESSION: Slight cortical irregularity about the lateral aspect of the right femoral neck may represent a nondisplaced fracture. Recommend further assessment with CT. Electronically Signed   By: Narda Rutherford M.D.   On: 03/18/2024 18:36   MR BRAIN WO CONTRAST Result Date: 03/04/2024 CLINICAL DATA:  Provided history: Acute CVA (cerebrovascular accident). Vision loss of right eye. EXAM: MRI HEAD WITHOUT  CONTRAST TECHNIQUE: Multiplanar, multiecho pulse sequences of the brain and surrounding structures were obtained without intravenous contrast. COMPARISON:  Head CT 12/06/2023. FINDINGS: Brain: Generalized parenchymal atrophy, mild for age. Chronic lacunar infarcts within the left frontal lobe centrum semiovale and bilateral thalami. Background multifocal T2 FLAIR hyperintense signal abnormality within the cerebral white matter, nonspecific but compatible with moderate chronic small vessel ischemic disease. There is no acute infarct. No evidence of an intracranial mass. No chronic intracranial blood products. No extra-axial fluid collection. No midline shift. Vascular: Maintained flow voids within the proximal large arterial vessels. Skull and upper cervical spine: No focal worrisome marrow lesion. Incompletely assessed cervical spondylosis. Sinuses/Orbits: No mass or acute finding within the imaged orbits. Mild mucosal thickening within a posterior left ethmoid air  cell. IMPRESSION: 1. No evidence of an acute intracranial abnormality. 2. Chronic lacunar infarcts within the left frontal lobe centrum semiovale and bilateral thalami. 3. Background moderate cerebral white matter chronic small vessel ischemic disease. 4. Mild generalized parenchymal atrophy. 5. Minor left ethmoid sinus mucosal thickening. Electronically Signed   By: Jackey Loge D.O.   On: 03/04/2024 17:05   (Echo, Carotid, EGD, Colonoscopy, ERCP)    Subjective: Pt c/o hip pain   Discharge Exam: Vitals:   03/20/24 0448 03/20/24 0745  BP: 132/70 136/65  Pulse: 75 76  Resp: 18 17  Temp: 98.1 F (36.7 C) 97.9 F (36.6 C)  SpO2: 97% 97%   Vitals:   03/19/24 2046 03/20/24 0011 03/20/24 0448 03/20/24 0745  BP: 116/72 128/64 132/70 136/65  Pulse: 83 74 75 76  Resp: 18 18 18 17   Temp: 97.9 F (36.6 C) 97.7 F (36.5 C) 98.1 F (36.7 C) 97.9 F (36.6 C)  TempSrc:    Oral  SpO2: 95% 98% 97% 97%  Weight:      Height:         General: Pt is alert, awake, not in acute distress Cardiovascular: S1/S2 +, no rubs, no gallops Respiratory: CTA bilaterally, no wheezing, no rhonchi Abdominal: Soft, NT,obese, bowel sounds + Extremities: no edema, no cyanosis    The results of significant diagnostics from this hospitalization (including imaging, microbiology, ancillary and laboratory) are listed below for reference.     Microbiology: Recent Results (from the past 240 hours)  MRSA Next Gen by PCR, Nasal     Status: None   Collection Time: 03/19/24  6:47 AM   Specimen: Nasal Mucosa; Nasal Swab  Result Value Ref Range Status   MRSA by PCR Next Gen NOT DETECTED NOT DETECTED Final    Comment: (NOTE) The GeneXpert MRSA Assay (FDA approved for NASAL specimens only), is one component of a comprehensive MRSA colonization surveillance program. It is not intended to diagnose MRSA infection nor to guide or monitor treatment for MRSA infections. Test performance is not FDA approved in patients less than 109 years old. Performed at [facility], [address] [place], [place] [address]      Labs: BNP (last 3 results) No results for input(s): "BNP" in the last 8760 hours. Basic Metabolic Panel: Recent Labs  Lab 03/19/24 0041 03/19/24 0526 03/20/24 0408  NA 139 140 137  K 3.8 3.8 4.4  CL 106 105 105  CO2 22 24 22   GLUCOSE 164* 103* 182*  BUN 30* 30* 30*  CREATININE 1.24* 1.09* 1.39*  CALCIUM 9.9 9.9 9.1   Liver Function Tests: No results for input(s): "AST", "ALT", "ALKPHOS", "BILITOT", "PROT", "ALBUMIN" in the last 168 hours. No results for input(s): "LIPASE", "AMYLASE" in the last 168 hours. No results for input(s): "AMMONIA" in the last 168 hours. CBC: Recent Labs  Lab 03/19/24 0041 03/19/24 0526 03/20/24 0408  WBC 8.1 7.5 11.2*  HGB 14.3 14.1 12.6  HCT 41.5 40.0 35.7*  MCV 87.4 84.6 84.4  PLT 194 206 204   Cardiac Enzymes: No results for input(s): "CKTOTAL", "CKMB", "CKMBINDEX",  "TROPONINI" in the last 168 hours. BNP: Invalid input(s): "POCBNP" CBG: Recent Labs  Lab 03/19/24 1429 03/19/24 2125 03/20/24 0753 03/20/24 1129 03/20/24 1655  GLUCAP 129* 236* 153* 182* 123*   D-Dimer No results for input(s): "DDIMER" in the last 72 hours. Hgb A1c Recent Labs    03/19/24 0533  HGBA1C 6.1*   Lipid Profile No results for input(s): "CHOL", "HDL", "  LDLCALC", "TRIG", "CHOLHDL", "LDLDIRECT" in the last 72 hours. Thyroid function studies No results for input(s): "TSH", "T4TOTAL", "T3FREE", "THYROIDAB" in the last 72 hours.  Invalid input(s): "FREET3" Anemia work up No results for input(s): "VITAMINB12", "FOLATE", "FERRITIN", "TIBC", "IRON", "RETICCTPCT" in the last 72 hours. Urinalysis    Component Value Date/Time   COLORURINE YELLOW (A) 12/06/2023 2200   APPEARANCEUR HAZY (A) 12/06/2023 2200   APPEARANCEUR Turbid 03/07/2013 0923   LABSPEC 1.028 12/06/2023 2200   LABSPEC 1.019 03/07/2013 0923   PHURINE 6.0 12/06/2023 2200   GLUCOSEU NEGATIVE 12/06/2023 2200   GLUCOSEU Negative 03/07/2013 0923   HGBUR NEGATIVE 12/06/2023 2200   BILIRUBINUR NEGATIVE 12/06/2023 2200   BILIRUBINUR Negative 03/07/2013 0923   KETONESUR NEGATIVE 12/06/2023 2200   PROTEINUR 30 (A) 12/06/2023 2200   NITRITE NEGATIVE 12/06/2023 2200   LEUKOCYTESUR LARGE (A) 12/06/2023 2200   LEUKOCYTESUR 3+ 03/07/2013 0923   Sepsis Labs Recent Labs  Lab 03/19/24 0041 03/19/24 0526 03/20/24 0408  WBC 8.1 7.5 11.2*   Microbiology Recent Results (from the past 240 hours)  MRSA Next Gen by PCR, Nasal     Status: None   Collection Time: 03/19/24  6:47 AM   Specimen: Nasal Mucosa; Nasal Swab  Result Value Ref Range Status   MRSA by PCR Next Gen NOT DETECTED NOT DETECTED Final    Comment: (NOTE) The GeneXpert MRSA Assay (FDA approved for NASAL specimens only), is one component of a comprehensive MRSA colonization surveillance program. It is not intended to diagnose MRSA infection nor to  guide or monitor treatment for MRSA infections. Test performance is not FDA approved in patients less than 65 years old. Performed at [facility], [address] [place], [place] [address]      Time coordinating discharge: Over 30 minutes  SIGNED:   Charise Killian, MD  [facility] 03/20/2024, 5:02 PM Pager   If 7PM-7AM, please contact night-coverage [email or web address]

## 2024-03-20 NOTE — Progress Notes (Signed)
 PROGRESS NOTE    Erika Nelson  UEA:[id number] DOB: [birth year] DOA: 03/18/2024 PCP: Danella Penton, MD   Assessment & Plan:   Principal Problem:   Closed right hip fracture, initial encounter [facility]) Active Problems:   Uncontrolled hypertension   Hypothyroidism   Anxiety and depression   Dyslipidemia   Type 2 diabetes mellitus without complications (HCC)   Subcapital fracture of hip, right, closed, initial encounter (HCC)  Assessment and Plan: Closed right hip fracture: s/p right hip closed reduction percutaneous pinning 03/19/24 as per ortho surg. Oxy, morphine prn for pain   HTN: continue on irbesartan     DM2: well controlled, HbA1c 6.0. Continue on SSI w/ accuchecks   HLD: continue on statin    Depression: severity unknown. Continue on home dose of paroxetine   Anxiety: severity unknown. Continue on home dose of xanax    Hypothyroidism: continue on home dose of levothyroxine   Obesity: BMI 31.4. Would benefit from weight loss     DVT prophylaxis: SCDs Code Status: full  Family Communication: discussed pt's care w/ pt's granddaughter at bedside and answered her questions  Disposition Plan: d/c home w/ HH   Level of care: Med-Surg   Status is: Inpatient Remains inpatient appropriate because: severity of illness, therapy will do steps with the pt sometime today. Likely d/c home tomorrow   Consultants:  Ortho surg   Procedures:   Antimicrobials:    Subjective: Pt c/o hip pain still.   Objective: Vitals:   03/19/24 2046 03/20/24 0011 03/20/24 0448 03/20/24 0745  BP: 116/72 128/64 132/70 136/65  Pulse: 83 74 75 76  Resp: 18 18 18 17   Temp: 97.9 F (36.6 C) 97.7 F (36.5 C) 98.1 F (36.7 C) 97.9 F (36.6 C)  TempSrc:    Oral  SpO2: 95% 98% 97% 97%  Weight:      Height:        Intake/Output Summary (Last 24 hours) at 03/20/2024 0850 Last data filed at 03/19/2024 1420 Gross per 24 hour  Intake 800 ml  Output 905 ml  Net -105 ml   Filed Weights    03/18/24 1711 03/18/24 1715  Weight: 83 kg 83 kg    Examination:  General exam: Appears comfortable  Respiratory system: clear breath sounds b/l  Cardiovascular system: S1/S2+. No gallops or rubs  Gastrointestinal system: abd is soft, NT, obese & hypoactive bowel sounds  Central nervous system: alert & oriented. Moves all extremities  Psychiatry: judgement and insight appears at baseline. Appropriate mood and affect    Data Reviewed: I have personally reviewed following labs and imaging studies  CBC: Recent Labs  Lab 03/19/24 0041 03/19/24 0526 03/20/24 0408  WBC 8.1 7.5 11.2*  HGB 14.3 14.1 12.6  HCT 41.5 40.0 35.7*  MCV 87.4 84.6 84.4  PLT 194 206 204   Basic Metabolic Panel: Recent Labs  Lab 03/19/24 0041 03/19/24 0526 03/20/24 0408  NA 139 140 137  K 3.8 3.8 4.4  CL 106 105 105  CO2 22 24 22   GLUCOSE 164* 103* 182*  BUN 30* 30* 30*  CREATININE 1.24* 1.09* 1.39*  CALCIUM 9.9 9.9 9.1   GFR: Estimated Creatinine Clearance: 33.1 mL/min (A) (by C-G formula based on SCr of 1.39 mg/dL (H)). Liver Function Tests: No results for input(s): "AST", "ALT", "ALKPHOS", "BILITOT", "PROT", "ALBUMIN" in the last 168 hours. No results for input(s): "LIPASE", "AMYLASE" in the last 168 hours. No results for input(s): "AMMONIA" in the last 168 hours. Coagulation Profile:  No results for input(s): "INR", "PROTIME" in the last 168 hours. Cardiac Enzymes: No results for input(s): "CKTOTAL", "CKMB", "CKMBINDEX", "TROPONINI" in the last 168 hours. BNP (last 3 results) No results for input(s): "PROBNP" in the last 8760 hours. HbA1C: Recent Labs    03/19/24 0533  HGBA1C 6.1*   CBG: Recent Labs  Lab 03/19/24 0655 03/19/24 1142 03/19/24 1429 03/19/24 2125 03/20/24 0753  GLUCAP 123* 133* 129* 236* 153*   Lipid Profile: No results for input(s): "CHOL", "HDL", "LDLCALC", "TRIG", "CHOLHDL", "LDLDIRECT" in the last 72 hours. Thyroid Function Tests: No results for  input(s): "TSH", "T4TOTAL", "FREET4", "T3FREE", "THYROIDAB" in the last 72 hours. Anemia Panel: No results for input(s): "VITAMINB12", "FOLATE", "FERRITIN", "TIBC", "IRON", "RETICCTPCT" in the last 72 hours. Sepsis Labs: No results for input(s): "PROCALCITON", "LATICACIDVEN" in the last 168 hours.  Recent Results (from the past 240 hours)  MRSA Next Gen by PCR, Nasal     Status: None   Collection Time: 03/19/24  6:47 AM   Specimen: Nasal Mucosa; Nasal Swab  Result Value Ref Range Status   MRSA by PCR Next Gen NOT DETECTED NOT DETECTED Final    Comment: (NOTE) The GeneXpert MRSA Assay (FDA approved for NASAL specimens only), is one component of a comprehensive MRSA colonization surveillance program. It is not intended to diagnose MRSA infection nor to guide or monitor treatment for MRSA infections. Test performance is not FDA approved in patients less than 57 years old. Performed at [facility], [address] [place], [place] [address]          Radiology Studies: DG HIP UNILAT WITH PELVIS 2-3 VIEWS RIGHT Result Date: 03/19/2024 CLINICAL DATA:  Elective surgery. EXAM: DG HIP (WITH OR WITHOUT PELVIS) 2-3V RIGHT COMPARISON:  Preoperative imaging FINDINGS: Two fluoroscopic spot views of the right hip submitted from the operating room. Three screws traverse femoral neck fracture. Fluoroscopy time 84 seconds. Dose 32.22 mGy. IMPRESSION: Intraoperative fluoroscopy during right femoral neck fracture fixation. Electronically Signed   By: Narda Rutherford M.D.   On: 03/19/2024 15:56   DG Knee Right Port Result Date: 03/19/2024 CLINICAL DATA:  Postop.  Right knee pain. EXAM: PORTABLE RIGHT KNEE - 1-2 VIEW COMPARISON:  07/26/2016 FINDINGS: Right knee arthroplasty in expected alignment. No acute or periprosthetic fracture. No periprosthetic lucency. Patellar resurfacing. No significant joint effusion. No erosive change. IMPRESSION: Right knee arthroplasty without complication.  Electronically Signed   By: Narda Rutherford M.D.   On: 03/19/2024 15:54   DG C-Arm 1-60 Min-No Report Result Date: 03/19/2024 Fluoroscopy was utilized by the requesting physician.  No radiographic interpretation.   DG C-Arm 1-60 Min-No Report Result Date: 03/19/2024 Fluoroscopy was utilized by the requesting physician.  No radiographic interpretation.   DG Chest 1 View Result Date: 03/18/2024 CLINICAL DATA:  Preop, fall EXAM: CHEST  1 VIEW COMPARISON:  Radiograph and CT 12/06/2023 FINDINGS: Stable cardiomediastinal silhouette. Aortic atherosclerotic calcification. No focal consolidation, pleural effusion, or pneumothorax. No displaced rib fractures. IMPRESSION: No active disease. Electronically Signed   By: Minerva Fester M.D.   On: 03/18/2024 23:22   CT Hip Right Wo Contrast Result Date: 03/18/2024 CLINICAL DATA:  Hip trauma, fracture suspected.  Fell 10 days ago. EXAM: CT OF THE RIGHT HIP WITHOUT CONTRAST TECHNIQUE: Multidetector CT imaging of the right hip was performed according to the standard protocol. Multiplanar CT image reconstructions were also generated. RADIATION DOSE REDUCTION: This exam was performed according to the departmental dose-optimization program which includes automated exposure control, adjustment of  the mA and/or kV according to patient size and/or use of iterative reconstruction technique. COMPARISON:  CT abdomen pelvis 12/06/2023 FINDINGS: Bones/Joint/Cartilage Nondisplaced mildly impacted subcapital femoral neck fracture. No dislocation. Ligaments Suboptimally assessed by CT. Muscles and Tendons No acute abnormality. Soft tissues Unremarkable. IMPRESSION: Nondisplaced mildly impacted subcapital femoral neck fracture. Electronically Signed   By: Minerva Fester M.D.   On: 03/18/2024 23:19   DG Hip Unilat W or Wo Pelvis 2-3 Views Right Result Date: 03/18/2024 CLINICAL DATA:  Hip pain. Fall 10 days ago. Patient reports right hip fracture at [facility] today. EXAM: DG HIP (WITH OR  WITHOUT PELVIS) 2-3V RIGHT COMPARISON:  None Available. FINDINGS: Slight cortical irregularity about the lateral aspect of the right femoral neck may represent a nondisplaced fracture. No additional fracture of the pelvis. No hip dislocation. Mild bilateral hip osteoarthritis. Pubic rami are intact. Pubic symphysis and sacroiliac joints are congruent. IMPRESSION: Slight cortical irregularity about the lateral aspect of the right femoral neck may represent a nondisplaced fracture. Recommend further assessment with CT. Electronically Signed   By: Narda Rutherford M.D.   On: 03/18/2024 18:36        Scheduled Meds:  acetaminophen  500 mg Oral Q6H   docusate sodium  100 mg Oral BID   enoxaparin (LOVENOX) injection  40 mg Subcutaneous Q24H   fluticasone furoate-vilanterol  1 puff Inhalation Daily   insulin aspart  0-5 Units Subcutaneous QHS   insulin aspart  0-9 Units Subcutaneous TID WC   irbesartan  37.5 mg Oral Daily   levothyroxine  75 mcg Oral QAC breakfast   multivitamin with minerals  1 tablet Oral Q2200   pantoprazole  40 mg Oral Daily   PARoxetine  20 mg Oral Daily   simvastatin  20 mg Oral QHS   Continuous Infusions:     LOS: 1 day       Charise Killian, MD [facility] Pager [phone]  If 7PM-7AM, please contact night-coverage [email or web address] 03/20/2024, 8:50 AM

## 2024-03-20 NOTE — Anesthesia Postprocedure Evaluation (Signed)
 Anesthesia Post Note  Patient: SAFIYYAH VASCONEZ  Procedure(s) Performed: PINNING, EXTREMITY, PERCUTANEOUS (Right: Hip)  Patient location during evaluation: PACU Anesthesia Type: General Level of consciousness: awake and alert Pain management: pain level controlled Vital Signs Assessment: post-procedure vital signs reviewed and stable Respiratory status: spontaneous breathing, nonlabored ventilation and respiratory function stable Cardiovascular status: blood pressure returned to baseline and stable Postop Assessment: no apparent nausea or vomiting Anesthetic complications: no   No notable events documented.   Last Vitals:  Vitals:   03/20/24 0448 03/20/24 0745  BP: 132/70 136/65  Pulse: 75 76  Resp: 18 17  Temp: 36.7 C 36.6 C  SpO2: 97% 97%    Last Pain:  Vitals:   03/20/24 0745  TempSrc: Oral  PainSc:                  Foye Deer

## 2024-03-20 NOTE — Plan of Care (Signed)
  Problem: Clinical Measurements: Goal: Will remain free from infection Outcome: Progressing Goal: Diagnostic test results will improve Outcome: Progressing   Problem: Activity: Goal: Risk for activity intolerance will decrease Outcome: Progressing   Problem: Nutrition: Goal: Adequate nutrition will be maintained Outcome: Progressing   Problem: Coping: Goal: Level of anxiety will decrease Outcome: Progressing   Problem: Pain Managment: Goal: General experience of comfort will improve and/or be controlled Outcome: Progressing   Problem: Safety: Goal: Ability to remain free from injury will improve Outcome: Progressing   Problem: Coping: Goal: Ability to adjust to condition or change in health will improve Outcome: Progressing   Problem: Metabolic: Goal: Ability to maintain appropriate glucose levels will improve Outcome: Progressing   Problem: Activity: Goal: Ability to ambulate and perform ADLs will improve Outcome: Progressing   Problem: Pain Management: Goal: Pain level will decrease Outcome: Progressing

## 2024-03-20 NOTE — Progress Notes (Signed)
 Physical Therapy Treatment Patient Details Name: Erika Nelson MRN: [id number] DOB: 03/25/1941 Today's Date: 03/20/2024   History of Present Illness Pt is an 83 y.o. pleasant female with medical history significant for anxiety, mild dementia, diverticulosis, GERD, osteoarthritis, hypertension and dyslipidemia, who presented to the emergency room with acute onset of right hip pain after having a mechanical fall. Pt diagnosed with right hip impacted femoral neck fracture and is s/p closed reduction percutaneous pinning.    PT Comments  Pt was pleasant and motivated to participate during the session and put forth good effort throughout. Pt reported increased R hip pain that began earlier this afternoon after being taken to the BR and sitting to standard height toilet.  Nursing notified and pain medications brought during session.  Pt and son present/participated in step-to gait training sequencing to assist with R hip pain control as well as in stair training with cues for proper sequencing.  Pt able to ascend/descend 4 steps with bilateral rails with increased time and effort and cuing for sequencing but required no physical assist.  Pt will benefit from continued PT services upon discharge to safely address deficits listed in patient problem list for decreased caregiver assistance and eventual return to PLOF.      If plan is discharge home, recommend the following: Supervision due to cognitive status;A little help with walking and/or transfers;A little help with bathing/dressing/bathroom;Assistance with cooking/housework;Help with stairs or ramp for entrance;Assist for transportation   Can travel by private vehicle        Equipment Recommendations  Rolling walker (2 wheels)    Recommendations for Other Services       Precautions / Restrictions Precautions Precautions: Fall Restrictions Weight Bearing Restrictions Per Provider Order: Yes RLE Weight Bearing Per Provider Order: Weight bearing  as tolerated     Mobility  Bed Mobility Overal bed mobility: Modified Independent             General bed mobility comments: NT, in recliner pre-post session    Transfers Overall transfer level: Needs assistance Equipment used: Rolling walker (2 wheels) Transfers: Sit to/from Stand Sit to Stand: Contact guard assist           General transfer comment: Mod multi-modal cues for hand placement and increased trunk flexion with pt able to come to standing without assistance but with extra time and effort compared to AM session    Ambulation/Gait Ambulation/Gait assistance: Contact guard assist Gait Distance (Feet): 6 Feet Assistive device: Rolling walker (2 wheels) Gait Pattern/deviations: Decreased step length - left, Decreased stance time - right, Antalgic, Trunk flexed, Step-to pattern Gait velocity: decreased     General Gait Details: Minimal amb this session with focus of session on stair training but with step-to pattern training provided for R hip pain control   Stairs Stairs: Yes Stairs assistance: Contact guard assist Stair Management: Two rails, Step to pattern, Forwards Number of Stairs: 4 General stair comments: Min multi-modal cues for proper sequencing with son present during training   Wheelchair Mobility     Tilt Bed    Modified Rankin (Stroke Patients Only)       Balance Overall balance assessment: Needs assistance, History of Falls   Sitting balance-Leahy Scale: Good     Standing balance support: Bilateral upper extremity supported, During functional activity Standing balance-Leahy Scale: Good  Communication Communication Communication: No apparent difficulties  Cognition Arousal: Alert Behavior During Therapy: WFL for tasks assessed/performed   PT - Cognitive impairments: No apparent impairments                         Following commands: Intact      Cueing Cueing  Techniques: Verbal cues, Visual cues, Tactile cues  Exercises Total Joint Exercises Long Arc Quad: AROM, Strengthening, Both, 10 reps Knee Flexion: AROM, Strengthening, Both, 10 reps Marching in Standing: AROM, Strengthening, Both, 5 reps, Standing Other Exercises Other Exercises: Car transfer sequencing review provided    General Comments        Pertinent Vitals/Pain Pain Assessment Pain Assessment: 0-10 Pain Score: 6  Pain Location: R hip Pain Descriptors / Indicators: Sore Pain Intervention(s): Repositioned, Premedicated before session, Monitored during session, Patient requesting pain meds-RN notified, RN gave pain meds during session    Home Living                          Prior Function            PT Goals (current goals can now be found in the care plan section) Progress towards PT goals: Progressing toward goals    Frequency    BID      PT Plan      Co-evaluation              AM-PAC PT "6 Clicks" Mobility   Outcome Measure  Help needed turning from your back to your side while in a flat bed without using bedrails?: A Little Help needed moving from lying on your back to sitting on the side of a flat bed without using bedrails?: A Little Help needed moving to and from a bed to a chair (including a wheelchair)?: A Little Help needed standing up from a chair using your arms (e.g., wheelchair or bedside chair)?: A Little Help needed to walk in hospital room?: A Little Help needed climbing 3-5 steps with a railing? : A Little 6 Click Score: 18    End of Session Equipment Utilized During Treatment: Gait belt Activity Tolerance: Patient tolerated treatment well Patient left: in chair;with call bell/phone within reach;with chair alarm set;with family/visitor present;with SCD's reapplied Nurse Communication: Mobility status;Weight bearing status PT Visit Diagnosis: History of falling (Z91.81);Other abnormalities of gait and mobility  (R26.89);Muscle weakness (generalized) (M62.81);Pain Pain - Right/Left: Right Pain - part of body: Hip     Time: [id number] PT Time Calculation (min) (ACUTE ONLY): 39 min  Charges:    $Gait Training: 23-37 mins PT General Charges $$ ACUTE PT VISIT: 1 Visit                    D. Scott Dary Dilauro PT, DPT 03/20/24, 5:28 PM

## 2024-03-20 NOTE — Evaluation (Signed)
 Physical Therapy Evaluation Patient Details Name: Erika Nelson MRN: [id number] DOB: [birth year] Today's Date: 03/20/2024  History of Present Illness  Pt is an 83 y.o. pleasant female with medical history significant for anxiety, mild dementia, diverticulosis, GERD, osteoarthritis, hypertension and dyslipidemia, who presented to the emergency room with acute onset of right hip pain after having a mechanical fall. Pt diagnosed with right hip impacted femoral neck fracture and is s/p closed reduction percutaneous pinning.   Clinical Impression  Pt was pleasant and motivated to participate during the session and put forth good effort throughout. Pt required extra time and effort with bed mobility tasks and cuing with transfers and gait for proper sequencing but required no physical assistance during the session.  Pt was generally steady with standing activities with no overt LOB and ambulated a self-selected max of 40 feet with a RW before fatiguing and needing to return to sitting.  Pt reported no adverse symptoms during the session other than mild R hip pain with SpO2 and HR WNL throughout on room air. Pt will benefit from continued PT services upon discharge to safely address deficits listed in patient problem list for decreased caregiver assistance and eventual return to PLOF.           If plan is discharge home, recommend the following: Supervision due to cognitive status;A little help with walking and/or transfers;A little help with bathing/dressing/bathroom;Assistance with cooking/housework;Help with stairs or ramp for entrance;Assist for transportation   Can travel by private vehicle        Equipment Recommendations Rolling walker (2 wheels)  Recommendations for Other Services       Functional Status Assessment Patient has had a recent decline in their functional status and demonstrates the ability to make significant improvements in function in a reasonable and predictable amount of  time.     Precautions / Restrictions Precautions Precautions: Fall Restrictions Weight Bearing Restrictions Per Provider Order: Yes RLE Weight Bearing Per Provider Order: Weight bearing as tolerated      Mobility  Bed Mobility Overal bed mobility: Modified Independent             General bed mobility comments: Min extra time and effort only    Transfers Overall transfer level: Needs assistance Equipment used: Rolling walker (2 wheels) Transfers: Sit to/from Stand Sit to Stand: Contact guard assist           General transfer comment: Mod multi-modal cues for hand placement    Ambulation/Gait Ambulation/Gait assistance: Contact guard assist Gait Distance (Feet): 40 Feet Assistive device: Rolling walker (2 wheels) Gait Pattern/deviations: Step-through pattern, Decreased step length - left, Decreased stance time - right, Antalgic, Trunk flexed Gait velocity: decreased     General Gait Details: Mildly antalgic gait pattern on the RLE with cues for amb closer to the RW with upright posture and within the RW during turns  Stairs            Wheelchair Mobility     Tilt Bed    Modified Rankin (Stroke Patients Only)       Balance Overall balance assessment: Needs assistance, History of Falls   Sitting balance-Leahy Scale: Good     Standing balance support: Bilateral upper extremity supported, During functional activity Standing balance-Leahy Scale: Good                               Pertinent Vitals/Pain Pain Assessment Pain Assessment: 0-10 Pain Score: 1  Pain Location: R hip Pain Descriptors / Indicators: Sore Pain Intervention(s): Repositioned, Premedicated before session, Monitored during session    Home Living Family/patient expects to be discharged to:: Private residence Living Arrangements: Other relatives Available Help at Discharge: Family;Available 24 hours/day Type of Home: House Home Access: Stairs to enter Entrance  Stairs-Rails: Right;Left;Can reach both Entrance Stairs-Number of Steps: 4   Home Layout: One level Home Equipment: Rollator (4 wheels);BSC/3in1;Cane - quad Additional Comments: Owns a tub transfer bench; lives with grandson and his wife and children, has 24/7 supervision    Prior Function Prior Level of Function : History of Falls (last six months)             Mobility Comments: Mod Ind amb household distances with a QC, 5 falls in the last 6 months secondary to LOB ADLs Comments: Assist with ADLs from family     Extremity/Trunk Assessment   Upper Extremity Assessment Upper Extremity Assessment: Overall WFL for tasks assessed    Lower Extremity Assessment Lower Extremity Assessment: Generalized weakness;RLE deficits/detail RLE: Unable to fully assess due to pain       Communication   Communication Communication: No apparent difficulties    Cognition Arousal: Alert Behavior During Therapy: WFL for tasks assessed/performed   PT - Cognitive impairments: No apparent impairments                         Following commands: Intact       Cueing Cueing Techniques: Verbal cues, Visual cues, Tactile cues     General Comments      Exercises Total Joint Exercises Long Arc Quad: AROM, Strengthening, Both, 10 reps Knee Flexion: AROM, Strengthening, Both, 10 reps Marching in Standing: AROM, Strengthening, Both, 5 reps, Standing   Assessment/Plan    PT Assessment Patient needs continued PT services  PT Problem List Decreased strength;Decreased activity tolerance;Decreased balance;Decreased mobility;Decreased knowledge of use of DME;Pain       PT Treatment Interventions DME instruction;Gait training;Stair training;Functional mobility training;Therapeutic activities;Therapeutic exercise;Balance training;Patient/family education    PT Goals (Current goals can be found in the Care Plan section)  Acute Rehab PT Goals Patient Stated Goal: To walk without  pain PT Goal Formulation: With patient Time For Goal Achievement: 04/02/24 Potential to Achieve Goals: Good    Frequency BID     Co-evaluation               AM-PAC PT "6 Clicks" Mobility  Outcome Measure Help needed turning from your back to your side while in a flat bed without using bedrails?: A Little Help needed moving from lying on your back to sitting on the side of a flat bed without using bedrails?: A Little Help needed moving to and from a bed to a chair (including a wheelchair)?: A Little Help needed standing up from a chair using your arms (e.g., wheelchair or bedside chair)?: A Little Help needed to walk in hospital room?: A Little Help needed climbing 3-5 steps with a railing? : A Lot 6 Click Score: 17    End of Session Equipment Utilized During Treatment: Gait belt Activity Tolerance: Patient tolerated treatment well Patient left: in chair;with call bell/phone within reach;with chair alarm set;with family/visitor present;with SCD's reapplied Nurse Communication: Mobility status;Weight bearing status PT Visit Diagnosis: History of falling (Z91.81);Other abnormalities of gait and mobility (R26.89);Muscle weakness (generalized) (M62.81);Pain Pain - Right/Left: Right Pain - part of body: Hip    Time: 1191-4782 PT Time Calculation (min) (ACUTE ONLY):  41 min   Charges:   PT Evaluation $PT Eval Moderate Complexity: 1 Mod PT Treatments $Gait Training: 8-22 mins PT General Charges $$ ACUTE PT VISIT: 1 Visit    D. Scott [place] Severs PT, DPT 03/20/24, 11:42 AM

## 2024-03-20 NOTE — Progress Notes (Addendum)
   Subjective: 1 Day Post-Op Procedure(s) (LRB): PINNING, EXTREMITY, PERCUTANEOUS (Right) Patient reports pain as mild.   Patient is well, and has had no acute complaints or problems Denies any CP, SOB, ABD pain. We will continue therapy today.  Plan is to go Home after hospital stay.  Objective: Vital signs in last 24 hours: Temp:  [97.5 F (36.4 C)-98.4 F (36.9 C)] 97.9 F (36.6 C) (03/21 0745) Pulse Rate:  [72-83] 76 (03/21 0745) Resp:  [15-27] 17 (03/21 0745) BP: (116-175)/(52-79) 136/65 (03/21 0745) SpO2:  [95 %-100 %] 97 % (03/21 0745)  Intake/Output from previous day: 03/20 0701 - 03/21 0700 In: 800 [I.V.:800] Out: 905 [Urine:900; Blood:5] Intake/Output this shift: No intake/output data recorded.  Recent Labs    03/19/24 0041 03/19/24 0526 03/20/24 0408  HGB 14.3 14.1 12.6   Recent Labs    03/19/24 0526 03/20/24 0408  WBC 7.5 11.2*  RBC 4.73 4.23  HCT 40.0 35.7*  PLT 206 204   Recent Labs    03/19/24 0526 03/20/24 0408  NA 140 137  K 3.8 4.4  CL 105 105  CO2 24 22  BUN 30* 30*  CREATININE 1.09* 1.39*  GLUCOSE 103* 182*  CALCIUM 9.9 9.1   No results for input(s): "LABPT", "INR" in the last 72 hours.  EXAM General - Patient is Alert, Appropriate, and Oriented Extremity - Neurovascular intact Sensation intact distally Intact pulses distally Dorsiflexion/Plantar flexion intact No cellulitis present Compartment soft Dressing - dressing C/D/I and no drainage Motor Function - intact, moving foot and toes well on exam.   Past Medical History:  Diagnosis Date   Anxiety    Cervicalgia    Diverticulosis    Generalized OA    Osteoarthritis bilateral knees   GERD (gastroesophageal reflux disease)    Headache    Hyperlipidemia    Hypertension    Occasional tremors     Assessment/Plan:   1 Day Post-Op Procedure(s) (LRB): PINNING, EXTREMITY, PERCUTANEOUS (Right) Principal Problem:   Closed right hip fracture, initial encounter  (HCC) Active Problems:   Uncontrolled hypertension   Hypothyroidism   Anxiety and depression   Dyslipidemia   Type 2 diabetes mellitus without complications (HCC)   Subcapital fracture of hip, right, closed, initial encounter (HCC)  Estimated body mass index is 31.41 kg/m as calculated from the following:   Height as of this encounter: 5\' 4"  (1.626 m).   Weight as of this encounter: 83 kg. Advance diet Up with therapy Pain well-controlled Labs and vital signs are stable Care management to assist with discharge.  Patient prefers to go home with home health PT.  Possible discharge to home with home health PT today pending safe completion of PT goals    Follow-up with [facility] orthopedics in 2 weeks for wound check and x-rays Lovenox 40 mg subcu daily x 14 days at discharge Patient can begin showering in 3 days.  Patient can remove honeycomb dressing in 1 week and shower with no dressing over the incision site.   DVT Prophylaxis - Lovenox, TED hose, and SCDs Weight-Bearing as tolerated to right leg   T. Cranston Neighbor, PA-C [facility] 03/20/2024, 8:56 AM

## 2024-03-20 NOTE — TOC Progression Note (Signed)
 Transition of Care [facility]) - Progression Note    Patient Details  Name: Erika Nelson MRN: [id number] Date of Birth: 12-07-1941  Transition of Care [facility]) CM/SW Contact  Marlowe Sax, RN Phone Number: 03/20/2024, 3:52 PM  Clinical Narrative:    I met with the patient and her family in the room, she needs HH and does not have a preference, [facility] will accept the patient She needs a rw and [employer] will bring to the bedside, I notified the nurse that she is requesting pain meds    Expected Discharge Plan: Home w Home Health Services Barriers to Discharge: Barriers Resolved  Expected Discharge Plan and Services   Discharge Planning Services: CM Consult   Living arrangements for the past 2 months: Single Family Home                 DME Arranged: Walker rolling DME Agency: [facility] Date DME Agency Contacted: 03/20/24 Time DME Agency Contacted: 1550 Representative spoke with at DME Agency: Cletis [place] HH Arranged: PT, OT [facility] Agency: [facility] Date [facility] Agency Contacted: 03/20/24 Time HH Agency Contacted: 1551 Representative spoke with at [facility] Agency: [place]   Social Determinants of Health (SDOH) Interventions SDOH Screenings   Food Insecurity: No Food Insecurity (03/19/2024)  Housing: Low Risk  (03/19/2024)  Transportation Needs: No Transportation Needs (03/19/2024)  Utilities: Not At Risk (03/19/2024)  Social Connections: Socially Isolated (03/19/2024)  Tobacco Use: Low Risk  (03/19/2024)  Recent Concern: Tobacco Use - Medium Risk (03/18/2024)   Received from [facility]    Readmission Risk Interventions     No data to display

## 2024-03-20 NOTE — Discharge Instructions (Addendum)
 [facility].  [address] , [place], [place], [address]. [phone] They will call you to arrange when they can come to see you   INSTRUCTIONS AFTER Surgery  Remove items at home which could result in a fall. This includes throw rugs or furniture in walking pathways ICE to the affected joint every three hours while awake for 30 minutes at a time, for at least the first 3-5 days, and then as needed for pain and swelling.  Continue to use ice for pain and swelling. You may notice swelling that will progress down to the foot and ankle.  This is normal after surgery.  Elevate your leg when you are not up walking on it.   Continue to use the breathing machine you got in the hospital (incentive spirometer) which will help keep your temperature down.  It is common for your temperature to cycle up and down following surgery, especially at night when you are not up moving around and exerting yourself.  The breathing machine keeps your lungs expanded and your temperature down.   DIET:  As you were doing prior to hospitalization, we recommend a well-balanced diet.  DRESSING / WOUND CARE / SHOWERING  Dressing change as needed.  No showering.  Follow-up with [facility] orthopedics in 2 weeks for staple removal  ACTIVITY  Increase activity slowly as tolerated, but follow the weight bearing instructions below.   No driving for 6 weeks or until further direction given by your physician.  You cannot drive while taking narcotics.  No lifting or carrying greater than 10 lbs. until further directed by your surgeon. Avoid periods of inactivity such as sitting longer than an hour when not asleep. This helps prevent blood clots.  You may return to work once you are authorized by your doctor.     WEIGHT BEARING  Weightbearing as tolerated on the right   EXERCISES Gait training and ambulation training with physical therapy.  CONSTIPATION  Constipation is defined medically as fewer than  three stools per week and severe constipation as less than one stool per week.  Even if you have a regular bowel pattern at home, your normal regimen is likely to be disrupted due to multiple reasons following surgery.  Combination of anesthesia, postoperative narcotics, change in appetite and fluid intake all can affect your bowels.   YOU MUST use at least one of the following options; they are listed in order of increasing strength to get the job done.  They are all available over the counter, and you may need to use some, POSSIBLY even all of these options:    Drink plenty of fluids (prune juice may be helpful) and high fiber foods Colace 100 mg by mouth twice a day  Senokot for constipation as directed and as needed Dulcolax (bisacodyl), take with full glass of water  Miralax (polyethylene glycol) once or twice a day as needed.  If you have tried all these things and are unable to have a bowel movement in the first 3-4 days after surgery call either your surgeon or your primary doctor.    If you experience loose stools or diarrhea, hold the medications until you stool forms back up.  If your symptoms do not get better within 1 week or if they get worse, check with your doctor.  If you experience "the worst abdominal pain ever" or develop nausea or vomiting, please contact the office immediately for further recommendations for treatment.   ITCHING:  If you experience itching with  your medications, try taking only a single pain pill, or even half a pain pill at a time.  You can also use Benadryl over the counter for itching or also to help with sleep.   TED HOSE STOCKINGS:  Use stockings on both legs until for at least 2 weeks or as directed by physician office. They may be removed at night for sleeping.  MEDICATIONS:  See your medication summary on the "After Visit Summary" that nursing will review with you.  You may have some home medications which will be placed on hold until you complete the  course of blood thinner medication.  It is important for you to complete the blood thinner medication as prescribed.  PRECAUTIONS:  If you experience chest pain or shortness of breath - call 911 immediately for transfer to the hospital emergency department.   If you develop a fever greater that 101 F, purulent drainage from wound, increased redness or drainage from wound, foul odor from the wound/dressing, or calf pain - CONTACT YOUR SURGEON.                                                   FOLLOW-UP APPOINTMENTS:  If you do not already have a post-op appointment, please call the office for an appointment to be seen by your surgeon.  Guidelines for how soon to be seen are listed in your "After Visit Summary", but are typically between 1-4 weeks after surgery.  OTHER INSTRUCTIONS:     MAKE SURE YOU:  Understand these instructions.  Get help right away if you are not doing well or get worse.    Thank you for letting us be a part of your medical care team.  It is a privilege we respect greatly.  We hope these instructions will help you stay on track for a fast and full recovery!

## 2024-03-21 ENCOUNTER — Inpatient Hospital Stay

## 2024-03-21 DIAGNOSIS — S72001A Fracture of unspecified part of neck of right femur, initial encounter for closed fracture: Secondary | ICD-10-CM | POA: Diagnosis not present

## 2024-03-21 LAB — GLUCOSE, CAPILLARY
Glucose-Capillary: 156 mg/dL — ABNORMAL HIGH (ref 70–99)
Glucose-Capillary: 123 mg/dL — ABNORMAL HIGH (ref 70–99)
Glucose-Capillary: 160 mg/dL — ABNORMAL HIGH (ref 70–99)

## 2024-03-21 LAB — CBC
HCT: 35.9 % — ABNORMAL LOW (ref 36.0–46.0)
Hemoglobin: 12.3 g/dL (ref 12.0–15.0)
MCH: 30 pg (ref 26.0–34.0)
MCHC: 34.3 g/dL (ref 30.0–36.0)
MCV: 87.6 fL (ref 80.0–100.0)
Platelets: 187 10*3/uL (ref 150–400)
RBC: 4.1 MIL/uL (ref 3.87–5.11)
RDW: 13.4 % (ref 11.5–15.5)
WBC: 9.2 10*3/uL (ref 4.0–10.5)
nRBC: 0 % (ref 0.0–0.2)

## 2024-03-21 LAB — BASIC METABOLIC PANEL
Anion gap: 9 (ref 5–15)
BUN: 42 mg/dL — ABNORMAL HIGH (ref 8–23)
CO2: 21 mmol/L — ABNORMAL LOW (ref 22–32)
Calcium: 9 mg/dL (ref 8.9–10.3)
Chloride: 107 mmol/L (ref 98–111)
Creatinine, Ser: 1.4 mg/dL — ABNORMAL HIGH (ref 0.44–1.00)
GFR, Estimated: 38 mL/min — ABNORMAL LOW (ref >60)
Glucose, Bld: 198 mg/dL — ABNORMAL HIGH (ref 70–99)
Potassium: 4 mmol/L (ref 3.5–5.1)
Sodium: 137 mmol/L (ref 135–145)

## 2024-03-21 MED ORDER — HYDROCODONE-ACETAMINOPHEN 7.5-325 MG PO TABS
1.0000 | ORAL_TABLET | Freq: Four times a day (QID) | ORAL | Status: DC | PRN
  Administered 2024-03-21 – 2024-03-23 (×5): 1 via ORAL
  Administered 2024-03-24 – 2024-03-25 (×3): 2 via ORAL
  Filled 2024-03-21: qty 1
  Filled 2024-03-21 (×4): qty 2
  Filled 2024-03-21 (×3): qty 1

## 2024-03-21 NOTE — Progress Notes (Signed)
 Physical Therapy Treatment Patient Details Name: Erika Nelson MRN: [id number] DOB: 1941/06/02 Today's Date: 03/21/2024   History of Present Illness Pt is an 83 y.o. pleasant female with medical history significant for anxiety, mild dementia, diverticulosis, GERD, osteoarthritis, hypertension and dyslipidemia, who presented to the emergency room with acute onset of right hip pain after having a mechanical fall. Pt diagnosed with right hip impacted femoral neck fracture and is s/p closed reduction percutaneous pinning.    PT Comments  Pt demonstrates much less anxiety this pm session and increased pain tolerance with movement although mobility is still limited 2/2 RLE pain.  Pt able to perform bed mobility (min A), tranfers (ModA), and stepped around to recliner with RW with min A.  Cues required for safe technique and sequencing.  Continued PT will assist pt towards greater dynamic standing balance, LE strengthening, and activity tolerance to increase safety and independence and decrease burden of care with functional mobility.     If plan is discharge home, recommend the following: Supervision due to cognitive status;A little help with bathing/dressing/bathroom;Assistance with cooking/housework;Help with stairs or ramp for entrance;Assist for transportation;Two people to help with walking and/or transfers   Can travel by private vehicle        Equipment Recommendations  Rolling walker (2 wheels)    Recommendations for Other Services       Precautions / Restrictions Precautions Precautions: Fall Restrictions Weight Bearing Restrictions Per Provider Order: Yes RLE Weight Bearing Per Provider Order: Weight bearing as tolerated     Mobility  Bed Mobility Overal bed mobility: Needs Assistance Bed Mobility: Supine to Sit     Supine to sit: Min assist Sit to supine: Mod assist   General bed mobility comments: significant R LE pain with movement.    Transfers Overall transfer  level: Needs assistance Equipment used: Rolling walker (2 wheels) Transfers: Sit to/from Stand, Bed to chair/wheelchair/BSC Sit to Stand: Mod assist, From elevated surface   Step pivot transfers: Min assist       General transfer comment: significant R LE pain with movement but only required  +1 assist pm session.    Ambulation/Gait Ambulation/Gait assistance: Min assist Gait Distance (Feet): 4 Feet Assistive device: Rolling walker (2 wheels) Gait Pattern/deviations: Step-to pattern, Trunk flexed, Antalgic Gait velocity: decreased     General Gait Details: steps around to recliner.   Stairs Stairs:  (unable today 2/2 significant R LE pain with movement.)           Wheelchair Mobility     Tilt Bed    Modified Rankin (Stroke Patients Only)       Balance Overall balance assessment: Needs assistance, History of Falls Sitting-balance support: Feet supported Sitting balance-Leahy Scale: Good     Standing balance support: Bilateral upper extremity supported, During functional activity Standing balance-Leahy Scale: Fair Standing balance comment: decreased weightbearing tolerance 2/2significant R LE pain with movement.                            Communication Communication Communication: No apparent difficulties  Cognition Arousal: Alert Behavior During Therapy: WFL for tasks assessed/performed   PT - Cognitive impairments: No apparent impairments                         Following commands: Intact      Cueing Cueing Techniques: Verbal cues, Visual cues, Tactile cues  Exercises      General Comments  Pertinent Vitals/Pain Pain Assessment Pain Assessment: Faces Faces Pain Scale: Hurts even more Pain Location: R hip Pain Descriptors / Indicators: Sharp, Aching Pain Intervention(s): Limited activity within patient's tolerance, Monitored during session, Repositioned, Patient requesting pain meds-RN notified    Home Living                           Prior Function            PT Goals (current goals can now be found in the care plan section) Acute Rehab PT Goals Patient Stated Goal: To walk without pain PT Goal Formulation: With patient Time For Goal Achievement: 04/02/24 Potential to Achieve Goals: Good Progress towards PT goals: Progressing toward goals    Frequency    BID      PT Plan      Co-evaluation              AM-PAC PT "6 Clicks" Mobility   Outcome Measure  Help needed turning from your back to your side while in a flat bed without using bedrails?: A Little Help needed moving from lying on your back to sitting on the side of a flat bed without using bedrails?: A Little Help needed moving to and from a bed to a chair (including a wheelchair)?: A Lot Help needed standing up from a chair using your arms (e.g., wheelchair or bedside chair)?: A Lot Help needed to walk in hospital room?: A Lot Help needed climbing 3-5 steps with a railing? : Total 6 Click Score: 13    End of Session Equipment Utilized During Treatment: Gait belt Activity Tolerance: Patient limited by pain Patient left: with chair alarm set;in chair;with call bell/phone within reach Nurse Communication: Mobility status;Weight bearing status PT Visit Diagnosis: History of falling (Z91.81);Other abnormalities of gait and mobility (R26.89);Muscle weakness (generalized) (M62.81);Pain Pain - Right/Left: Right Pain - part of body: Hip     Time: 9563-8756 PT Time Calculation (min) (ACUTE ONLY): 17 min  Charges:    $Therapeutic Activity: 8-22 mins PT General Charges $$ ACUTE PT VISIT: 1 Visit                     Hortencia Conradi, PTA  03/21/24, 2:41 PM

## 2024-03-21 NOTE — Progress Notes (Signed)
 Physical Therapy Treatment Patient Details Name: Erika Nelson MRN: [id number] DOB: 1941/05/15 Today's Date: 03/21/2024   History of Present Illness Pt is an 83 y.o. pleasant female with medical history significant for anxiety, mild dementia, diverticulosis, GERD, osteoarthritis, hypertension and dyslipidemia, who presented to the emergency room with acute onset of right hip pain after having a mechanical fall. Pt diagnosed with right hip impacted femoral neck fracture and is s/p closed reduction percutaneous pinning.    PT Comments  Pt reports severe R hip pain and indicates that she has had increased R hip pain that began earlier on 3/21 in the afternoon after being taken to the BR and sitting to standard height toilet; MD aware of pt's concern, of her increased R hip pain, and decreases tolerance to mobility today requiring increased Assist.  Pt requires increased assist with bed mobility (Min-Mod A), transfers (Mod A +2 ), and side stepping only a few steps along bedside (ModA).  Continued PT will assist pt towards greater LE strength and activity tolerance to increase safety and independence and decrease burden of care with functional mobility.     If plan is discharge home, recommend the following: Supervision due to cognitive status;A little help with bathing/dressing/bathroom;Assistance with cooking/housework;Help with stairs or ramp for entrance;Assist for transportation;Two people to help with walking and/or transfers   Can travel by private vehicle        Equipment Recommendations  Rolling walker (2 wheels)    Recommendations for Other Services       Precautions / Restrictions Precautions Precautions: Fall Restrictions Weight Bearing Restrictions Per Provider Order: Yes RLE Weight Bearing Per Provider Order: Weight bearing as tolerated     Mobility  Bed Mobility Overal bed mobility: Needs Assistance Bed Mobility: Supine to Sit, Sit to Supine     Supine to sit: Min  assist Sit to supine: Mod assist   General bed mobility comments: significant R LE pain with movement.    Transfers Overall transfer level: Needs assistance   Transfers: Sit to/from Stand Sit to Stand: Mod assist, +2 physical assistance           General transfer comment: significant R LE pain with movement requires +2 assist.    Ambulation/Gait Ambulation/Gait assistance: Mod assist Gait Distance (Feet): 2 Feet Assistive device: Rolling walker (2 wheels) Gait Pattern/deviations: Step-to pattern, Trunk flexed, Antalgic Gait velocity: decreased     General Gait Details: Pt only tolerated a few side steps along bed 2/2 significant R LE pain with movement.   Stairs Stairs:  (unable today 2/2 significant R LE pain with movement.)           Wheelchair Mobility     Tilt Bed    Modified Rankin (Stroke Patients Only)       Balance Overall balance assessment: Needs assistance, History of Falls Sitting-balance support: Feet supported Sitting balance-Leahy Scale: Good     Standing balance support: Bilateral upper extremity supported, During functional activity Standing balance-Leahy Scale: Fair Standing balance comment: decreased weightbearing tolerance 2/2significant R LE pain with movement.                            Communication    Cognition Arousal: Alert Behavior During Therapy: WFL for tasks assessed/performed   PT - Cognitive impairments: No apparent impairments  Cueing    Exercises      General Comments        Pertinent Vitals/Pain Pain Assessment Pain Assessment: Faces Faces Pain Scale: Hurts worst Pain Location: R hip Pain Descriptors / Indicators: Sharp, Aching Pain Intervention(s): Limited activity within patient's tolerance, Monitored during session, Premedicated before session    Home Living                          Prior Function            PT Goals (current  goals can now be found in the care plan section) Acute Rehab PT Goals Patient Stated Goal: To walk without pain PT Goal Formulation: With patient Time For Goal Achievement: 04/02/24 Potential to Achieve Goals: Good Progress towards PT goals: Not progressing toward goals - comment (increase assist needed with mobility 2/2 significant R LE pain with movement.)    Frequency    BID      PT Plan      Co-evaluation              AM-PAC PT "6 Clicks" Mobility   Outcome Measure  Help needed turning from your back to your side while in a flat bed without using bedrails?: A Little Help needed moving from lying on your back to sitting on the side of a flat bed without using bedrails?: A Little Help needed moving to and from a bed to a chair (including a wheelchair)?: A Lot Help needed standing up from a chair using your arms (e.g., wheelchair or bedside chair)?: A Lot Help needed to walk in hospital room?: Total Help needed climbing 3-5 steps with a railing? : Total 6 Click Score: 12    End of Session Equipment Utilized During Treatment: Gait belt Activity Tolerance: Patient tolerated treatment well Patient left: in bed;with family/visitor present Nurse Communication: Mobility status;Weight bearing status PT Visit Diagnosis: History of falling (Z91.81);Other abnormalities of gait and mobility (R26.89);Muscle weakness (generalized) (M62.81);Pain Pain - Right/Left: Right Pain - part of body: Hip     Time: 1000-1047 PT Time Calculation (min) (ACUTE ONLY): 47 min  Charges:    $Therapeutic Activity: 38-52 mins PT General Charges $$ ACUTE PT VISIT: 1 Visit                     Hortencia Conradi, PTA  03/21/24, 11:06 AM

## 2024-03-21 NOTE — Progress Notes (Signed)
   Subjective: 2 Days Post-Op Procedure(s) (LRB): PINNING, EXTREMITY, PERCUTANEOUS (Right) Patient reports pain as mild.   Patient is well, and has had no acute complaints or problems Denies any CP, SOB, ABD pain. We will continue therapy today.  Plan is to go Home after hospital stay.  Objective: Vital signs in last 24 hours: Temp:  [97.7 F (36.5 C)-98 F (36.7 C)] 98 F (36.7 C) (03/22 0412) Pulse Rate:  [76-78] 78 (03/22 0412) Resp:  [17-18] 18 (03/22 0412) BP: (128-136)/(49-65) 128/49 (03/22 0412) SpO2:  [96 %-98 %] 96 % (03/22 0412)  Intake/Output from previous day: 03/21 0701 - 03/22 0700 In: 240 [P.O.:240] Out: -  Intake/Output this shift: No intake/output data recorded.  Recent Labs    03/19/24 0041 03/19/24 0526 03/20/24 0408 03/21/24 0553  HGB 14.3 14.1 12.6 12.3   Recent Labs    03/20/24 0408 03/21/24 0553  WBC 11.2* 9.2  RBC 4.23 4.10  HCT 35.7* 35.9*  PLT 204 187   Recent Labs    03/19/24 0526 03/20/24 0408  NA 140 137  K 3.8 4.4  CL 105 105  CO2 24 22  BUN 30* 30*  CREATININE 1.09* 1.39*  GLUCOSE 103* 182*  CALCIUM 9.9 9.1   No results for input(s): "LABPT", "INR" in the last 72 hours.  EXAM General - Patient is Alert, Appropriate, and Oriented Extremity - Neurovascular intact Sensation intact distally Intact pulses distally Dorsiflexion/Plantar flexion intact No cellulitis present Compartment soft Dressing - dressing C/D/I and no drainage Motor Function - intact, moving foot and toes well on exam.  Ambulated 40 feet with physical therapy.  Past Medical History:  Diagnosis Date   Anxiety    Cervicalgia    Diverticulosis    Generalized OA    Osteoarthritis bilateral knees   GERD (gastroesophageal reflux disease)    Headache    Hyperlipidemia    Hypertension    Occasional tremors     Assessment/Plan:   2 Days Post-Op Procedure(s) (LRB): PINNING, EXTREMITY, PERCUTANEOUS (Right) Principal Problem:   Closed right hip  fracture, initial encounter (HCC) Active Problems:   Uncontrolled hypertension   Hypothyroidism   Anxiety and depression   Dyslipidemia   Type 2 diabetes mellitus without complications (HCC)   Subcapital fracture of hip, right, closed, initial encounter (HCC)  Estimated body mass index is 31.41 kg/m as calculated from the following:   Height as of this encounter: 5\' 4"  (1.626 m).   Weight as of this encounter: 83 kg. Advance diet Up with therapy Pain well-controlled Labs and vital signs are stable Care management to assist with discharge.  Patient prefers to go home with home health PT.  Possible discharge to home with home health PT today pending safe completion of PT goals.  He will discharge yesterday to go home because of increased pain with using the low toilet seat.    Follow-up with [facility] orthopedics in 2 weeks for wound check and x-rays Lovenox 40 mg subcu daily x 14 days at discharge Patient can begin showering in 3 days.  Patient can remove honeycomb dressing in 1 week and shower with no dressing over the incision site.   DVT Prophylaxis - Lovenox, TED hose, and SCDs Weight-Bearing as tolerated to right leg   Dedra Skeens, PA-C [facility] 03/21/2024, 6:26 AM

## 2024-03-21 NOTE — Progress Notes (Signed)
 PROGRESS NOTE    Erika Nelson 83  ZHY:[id number] DOB: [birth year] DOA: 03/18/2024 PCP: Danella Penton, MD   Assessment & Plan:   Principal Problem:   Closed right hip fracture, initial encounter [facility]) Active Problems:   Uncontrolled hypertension   Hypothyroidism   Anxiety and depression   Dyslipidemia   Type 2 diabetes mellitus without complications (HCC)   Subcapital fracture of hip, right, closed, initial encounter (HCC)  Assessment and Plan: Closed right hip fracture: s/p right hip closed reduction percutaneous pinning 03/19/24 as per ortho surg. Oxy d/c and started on norco as it did not improve the pt's pain. Has significant right hip pain. Repeat XR shows no new fracture & hardware is stable.   HTN: continue on irbesartan    DM2: well controlled, HbA1c 6.0. Continue w/ SSI w/ accuchecks   HLD: continue on statin    Depression: severity unknown. Continue on home dose of paroxetine   Anxiety: severity unknown. Continue on home dose of xanax   Hypothyroidism: continue on home dose of levothyroxine   Obesity: BMI 31.4. Would benefit from weight loss    DVT prophylaxis: SCDs Code Status: full  Family Communication: discussed pt's care w/ pt's son at bedside and answered his questions Disposition Plan: d/c home w/ HH   Level of care: Med-Surg   Status is: Inpatient Remains inpatient appropriate because: severity of illness, likely d/c home tomorrow if pain has improved   Consultants:  Ortho surg   Procedures:   Antimicrobials:    Subjective: Pt c/o significant right hip pain   Objective: Vitals:   03/20/24 0745 03/20/24 2036 03/21/24 0412 03/21/24 0738  BP: 136/65 (!) 134/59 (!) 128/49 (!) 128/54  Pulse: 76 76 78 80  Resp: 17 18 18 16   Temp: 97.9 F (36.6 C) 97.7 F (36.5 C) 98 F (36.7 C) 98.2 F (36.8 C)  TempSrc: Oral     SpO2: 97% 98% 96% 97%  Weight:      Height:        Intake/Output Summary (Last 24 hours) at 03/21/2024 1433 Last data  filed at 03/21/2024 0600 Gross per 24 hour  Intake --  Output 500 ml  Net -500 ml   Filed Weights   03/18/24 1711 03/18/24 1715  Weight: 83 kg 83 kg    Examination:  General exam: Appears uncomfortable  Respiratory system: clear breath sounds b/l  Cardiovascular system: S1 & S2+. No rubs or gallops  Gastrointestinal system: abd is soft, NT, obese & normal bowel sounds  Central nervous system: alert & oriented. Moves all extremities  Psychiatry: judgement and insight appears at baseline. Flat mood and affect    Data Reviewed: I have personally reviewed following labs and imaging studies  CBC: Recent Labs  Lab 03/19/24 0041 03/19/24 0526 03/20/24 0408 03/21/24 0553  WBC 8.1 7.5 11.2* 9.2  HGB 14.3 14.1 12.6 12.3  HCT 41.5 40.0 35.7* 35.9*  MCV 87.4 84.6 84.4 87.6  PLT 194 206 204 187   Basic Metabolic Panel: Recent Labs  Lab 03/19/24 0041 03/19/24 0526 03/20/24 0408 03/21/24 0553  NA 139 140 137 137  K 3.8 3.8 4.4 4.0  CL 106 105 105 107  CO2 22 24 22  21*  GLUCOSE 164* 103* 182* 198*  BUN 30* 30* 30* 42*  CREATININE 1.24* 1.09* 1.39* 1.40*  CALCIUM 9.9 9.9 9.1 9.0   GFR: Estimated Creatinine Clearance: 32.8 mL/min (A) (by C-G formula based on SCr of 1.4 mg/dL (H)). Liver Function Tests: No  results for input(s): "AST", "ALT", "ALKPHOS", "BILITOT", "PROT", "ALBUMIN" in the last 168 hours. No results for input(s): "LIPASE", "AMYLASE" in the last 168 hours. No results for input(s): "AMMONIA" in the last 168 hours. Coagulation Profile: No results for input(s): "INR", "PROTIME" in the last 168 hours. Cardiac Enzymes: No results for input(s): "CKTOTAL", "CKMB", "CKMBINDEX", "TROPONINI" in the last 168 hours. BNP (last 3 results) No results for input(s): "PROBNP" in the last 8760 hours. HbA1C: Recent Labs    03/19/24 0533  HGBA1C 6.1*   CBG: Recent Labs  Lab 03/20/24 0753 03/20/24 1129 03/20/24 1655 03/20/24 2129 03/21/24 1155  GLUCAP 153* 182* 123*  186* 156*   Lipid Profile: No results for input(s): "CHOL", "HDL", "LDLCALC", "TRIG", "CHOLHDL", "LDLDIRECT" in the last 72 hours. Thyroid Function Tests: No results for input(s): "TSH", "T4TOTAL", "FREET4", "T3FREE", "THYROIDAB" in the last 72 hours. Anemia Panel: No results for input(s): "VITAMINB12", "FOLATE", "FERRITIN", "TIBC", "IRON", "RETICCTPCT" in the last 72 hours. Sepsis Labs: No results for input(s): "PROCALCITON", "LATICACIDVEN" in the last 168 hours.  Recent Results (from the past 240 hours)  MRSA Next Gen by PCR, Nasal     Status: None   Collection Time: 03/19/24  6:47 AM   Specimen: Nasal Mucosa; Nasal Swab  Result Value Ref Range Status   MRSA by PCR Next Gen NOT DETECTED NOT DETECTED Final    Comment: (NOTE) The GeneXpert MRSA Assay (FDA approved for NASAL specimens only), is one component of a comprehensive MRSA colonization surveillance program. It is not intended to diagnose MRSA infection nor to guide or monitor treatment for MRSA infections. Test performance is not FDA approved in patients less than 70 years old. Performed at [facility], [address] [place], [place] [address]          Radiology Studies: DG FEMUR PORT, MIN 2 VIEWS RIGHT Result Date: 03/21/2024 CLINICAL DATA:  Right hip pain following cannulated hip pinning azygo. Pain radiates down right leg. EXAM: RIGHT FEMUR PORTABLE 2 VIEW COMPARISON:  Right knee radiographs 03/27/2024, intraoperative fluoroscopy right hip 03/27/2024, pelvis and right hip radiographs 03/18/2024, CT right hip 03/18/2024 FINDINGS: Interval fixation of the previously seen proximal right femoral fracture with 3 longitudinal screws. Redemonstration of total right knee arthroplasty. No perihardware lucency is seen to indicate hardware failure or loosening. No knee joint effusion. Mild to moderate right femoroacetabular joint space narrowing. Mild pubic symphysis joint space narrowing and subchondral sclerosis.  Mild-to-moderate inferior right sacroiliac subchondral sclerosis. Expected postoperative lateral right hip subcutaneous air. Chronic mineralized densities are again seen lateral to the right ilium. IMPRESSION: Interval fixation of the previously seen proximal right femoral fracture with 3 longitudinal screws. No evidence of hardware failure or loosening. Electronically Signed   By: Neita Garnet M.D.   On: 03/21/2024 12:14   DG Knee Right Port Result Date: 03/19/2024 CLINICAL DATA:  Postop.  Right knee pain. EXAM: PORTABLE RIGHT KNEE - 1-2 VIEW COMPARISON:  07/26/2016 FINDINGS: Right knee arthroplasty in expected alignment. No acute or periprosthetic fracture. No periprosthetic lucency. Patellar resurfacing. No significant joint effusion. No erosive change. IMPRESSION: Right knee arthroplasty without complication. Electronically Signed   By: Narda Rutherford M.D.   On: 03/19/2024 15:54        Scheduled Meds:  docusate sodium  100 mg Oral BID   enoxaparin (LOVENOX) injection  40 mg Subcutaneous Q24H   fluticasone furoate-vilanterol  1 puff Inhalation Daily   insulin aspart  0-5 Units Subcutaneous QHS   insulin aspart  0-9  Units Subcutaneous TID WC   irbesartan  37.5 mg Oral Daily   levothyroxine  75 mcg Oral QAC breakfast   multivitamin with minerals  1 tablet Oral Q2200   pantoprazole  40 mg Oral Daily   PARoxetine  20 mg Oral Daily   simvastatin  20 mg Oral QHS   Continuous Infusions:     LOS: 2 days       Charise Killian, MD [facility] Pager [phone]  If 7PM-7AM, please contact night-coverage [email or web address] 03/21/2024, 2:33 PM

## 2024-03-21 NOTE — Plan of Care (Signed)
  Problem: Education: Goal: Knowledge of General Education information will improve Description: Including pain rating scale, medication(s)/side effects and non-pharmacologic comfort measures Outcome: Progressing   Problem: Clinical Measurements: Goal: Will remain free from infection Outcome: Progressing Goal: Diagnostic test results will improve Outcome: Progressing   Problem: Activity: Goal: Risk for activity intolerance will decrease Outcome: Progressing   Problem: Nutrition: Goal: Adequate nutrition will be maintained Outcome: Progressing   Problem: Coping: Goal: Level of anxiety will decrease Outcome: Progressing   Problem: Pain Managment: Goal: General experience of comfort will improve and/or be controlled Outcome: Progressing   Problem: Safety: Goal: Ability to remain free from injury will improve Outcome: Progressing   Problem: Pain Management: Goal: Pain level will decrease Outcome: Progressing

## 2024-03-21 NOTE — Plan of Care (Signed)
  Problem: Education: Goal: Knowledge of General Education information will improve Description: Including pain rating scale, medication(s)/side effects and non-pharmacologic comfort measures Outcome: Progressing   Problem: Health Behavior/Discharge Planning: Goal: Ability to manage health-related needs will improve Outcome: Progressing   Problem: Clinical Measurements: Goal: Ability to maintain clinical measurements within normal limits will improve Outcome: Progressing Goal: Will remain free from infection Outcome: Progressing Goal: Diagnostic test results will improve Outcome: Progressing Goal: Respiratory complications will improve Outcome: Progressing Goal: Cardiovascular complication will be avoided Outcome: Progressing   Problem: Activity: Goal: Risk for activity intolerance will decrease Outcome: Progressing   Problem: Nutrition: Goal: Adequate nutrition will be maintained Outcome: Progressing   Problem: Coping: Goal: Level of anxiety will decrease Outcome: Progressing   Problem: Elimination: Goal: Will not experience complications related to bowel motility Outcome: Progressing Goal: Will not experience complications related to urinary retention Outcome: Progressing   Problem: Pain Managment: Goal: General experience of comfort will improve and/or be controlled Outcome: Progressing   Problem: Safety: Goal: Ability to remain free from injury will improve Outcome: Progressing   Problem: Skin Integrity: Goal: Risk for impaired skin integrity will decrease Outcome: Progressing   Problem: Education: Goal: Ability to describe self-care measures that may prevent or decrease complications (Diabetes Survival Skills Education) will improve Outcome: Progressing Goal: Individualized Educational Video(s) Outcome: Progressing   Problem: Coping: Goal: Ability to adjust to condition or change in health will improve Outcome: Progressing   Problem: Fluid  Volume: Goal: Ability to maintain a balanced intake and output will improve Outcome: Progressing   Problem: Health Behavior/Discharge Planning: Goal: Ability to identify and utilize available resources and services will improve Outcome: Progressing Goal: Ability to manage health-related needs will improve Outcome: Progressing   Problem: Metabolic: Goal: Ability to maintain appropriate glucose levels will improve Outcome: Progressing   Problem: Nutritional: Goal: Maintenance of adequate nutrition will improve Outcome: Progressing Goal: Progress toward achieving an optimal weight will improve Outcome: Progressing   Problem: Skin Integrity: Goal: Risk for impaired skin integrity will decrease Outcome: Progressing   Problem: Tissue Perfusion: Goal: Adequacy of tissue perfusion will improve Outcome: Progressing   Problem: Education: Goal: Verbalization of understanding the information provided (i.e., activity precautions, restrictions, etc) will improve Outcome: Progressing Goal: Individualized Educational Video(s) Outcome: Progressing   Problem: Activity: Goal: Ability to ambulate and perform ADLs will improve Outcome: Progressing   Problem: Clinical Measurements: Goal: Postoperative complications will be avoided or minimized Outcome: Progressing   Problem: Self-Concept: Goal: Ability to maintain and perform role responsibilities to the fullest extent possible will improve Outcome: Progressing   Problem: Pain Management: Goal: Pain level will decrease Outcome: Progressing

## 2024-03-22 DIAGNOSIS — S72001A Fracture of unspecified part of neck of right femur, initial encounter for closed fracture: Secondary | ICD-10-CM | POA: Diagnosis not present

## 2024-03-22 LAB — CBC
HCT: 35.1 % — ABNORMAL LOW (ref 36.0–46.0)
Hemoglobin: 11.7 g/dL — ABNORMAL LOW (ref 12.0–15.0)
MCH: 29.7 pg (ref 26.0–34.0)
MCHC: 33.3 g/dL (ref 30.0–36.0)
MCV: 89.1 fL (ref 80.0–100.0)
Platelets: 180 10*3/uL (ref 150–400)
RBC: 3.94 MIL/uL (ref 3.87–5.11)
RDW: 13.4 % (ref 11.5–15.5)
WBC: 8.9 10*3/uL (ref 4.0–10.5)
nRBC: 0 % (ref 0.0–0.2)

## 2024-03-22 LAB — GLUCOSE, CAPILLARY
Glucose-Capillary: 138 mg/dL — ABNORMAL HIGH (ref 70–99)
Glucose-Capillary: 219 mg/dL — ABNORMAL HIGH (ref 70–99)
Glucose-Capillary: 134 mg/dL — ABNORMAL HIGH (ref 70–99)
Glucose-Capillary: 147 mg/dL — ABNORMAL HIGH (ref 70–99)

## 2024-03-22 LAB — BASIC METABOLIC PANEL WITH GFR
Anion gap: 5 (ref 5–15)
BUN: 40 mg/dL — ABNORMAL HIGH (ref 8–23)
CO2: 24 mmol/L (ref 22–32)
Calcium: 8.7 mg/dL — ABNORMAL LOW (ref 8.9–10.3)
Chloride: 106 mmol/L (ref 98–111)
Creatinine, Ser: 1.26 mg/dL — ABNORMAL HIGH (ref 0.44–1.00)
GFR, Estimated: 43 mL/min — ABNORMAL LOW
Glucose, Bld: 129 mg/dL — ABNORMAL HIGH (ref 70–99)
Potassium: 4.5 mmol/L (ref 3.5–5.1)
Sodium: 135 mmol/L (ref 135–145)

## 2024-03-22 NOTE — Plan of Care (Signed)
  Problem: Education: Goal: Knowledge of General Education information will improve Description: Including pain rating scale, medication(s)/side effects and non-pharmacologic comfort measures Outcome: Progressing   Problem: Health Behavior/Discharge Planning: Goal: Ability to manage health-related needs will improve Outcome: Progressing   Problem: Clinical Measurements: Goal: Ability to maintain clinical measurements within normal limits will improve Outcome: Progressing Goal: Will remain free from infection Outcome: Progressing Goal: Diagnostic test results will improve Outcome: Progressing Goal: Respiratory complications will improve Outcome: Progressing Goal: Cardiovascular complication will be avoided Outcome: Progressing   Problem: Activity: Goal: Risk for activity intolerance will decrease Outcome: Progressing   Problem: Nutrition: Goal: Adequate nutrition will be maintained Outcome: Progressing   Problem: Coping: Goal: Level of anxiety will decrease Outcome: Progressing   Problem: Elimination: Goal: Will not experience complications related to bowel motility Outcome: Progressing Goal: Will not experience complications related to urinary retention Outcome: Progressing   Problem: Pain Managment: Goal: General experience of comfort will improve and/or be controlled Outcome: Progressing   Problem: Safety: Goal: Ability to remain free from injury will improve Outcome: Progressing   Problem: Skin Integrity: Goal: Risk for impaired skin integrity will decrease Outcome: Progressing   Problem: Education: Goal: Ability to describe self-care measures that may prevent or decrease complications (Diabetes Survival Skills Education) will improve Outcome: Progressing Goal: Individualized Educational Video(s) Outcome: Progressing   Problem: Coping: Goal: Ability to adjust to condition or change in health will improve Outcome: Progressing   Problem: Fluid  Volume: Goal: Ability to maintain a balanced intake and output will improve Outcome: Progressing   Problem: Health Behavior/Discharge Planning: Goal: Ability to identify and utilize available resources and services will improve Outcome: Progressing Goal: Ability to manage health-related needs will improve Outcome: Progressing   Problem: Metabolic: Goal: Ability to maintain appropriate glucose levels will improve Outcome: Progressing   Problem: Nutritional: Goal: Maintenance of adequate nutrition will improve Outcome: Progressing Goal: Progress toward achieving an optimal weight will improve Outcome: Progressing   Problem: Skin Integrity: Goal: Risk for impaired skin integrity will decrease Outcome: Progressing   Problem: Tissue Perfusion: Goal: Adequacy of tissue perfusion will improve Outcome: Progressing   Problem: Education: Goal: Verbalization of understanding the information provided (i.e., activity precautions, restrictions, etc) will improve Outcome: Progressing Goal: Individualized Educational Video(s) Outcome: Progressing   Problem: Activity: Goal: Ability to ambulate and perform ADLs will improve Outcome: Progressing   Problem: Clinical Measurements: Goal: Postoperative complications will be avoided or minimized Outcome: Progressing   Problem: Self-Concept: Goal: Ability to maintain and perform role responsibilities to the fullest extent possible will improve Outcome: Progressing   Problem: Pain Management: Goal: Pain level will decrease Outcome: Progressing

## 2024-03-22 NOTE — TOC Transition Note (Signed)
 Transition of Care [facility]) - Discharge Note   Patient Details  Name: Erika Nelson MRN: [id number] Date of Birth: 12/12/1941  [facility] CM/SW Contact:  Bing Quarry, RN Phone Number: 03/22/2024, 11:10 AM   Clinical Narrative: 3/23: Pending discharge today to home with home health via [facility]. DME RW was not delivered after it was ordered on Friday. 3:1 added to order and contacted [employer] via [facility]. Progress note for co-sign in chart and provider notified of co-sign need. Home health orders confirmed in chart.    Gabriel Cirri MSN RN CM  RN Case Manager [place]  Transitions of Care Direct Dial: [phone] (Weekends Only) [facility] Main Office Phone: [phone] [facility] Fax: [phone] [email or web address]      Final next level of care: Home w Home Health Services Barriers to Discharge: Barriers Resolved   Patient Goals and CMS Choice            Discharge Placement                       Discharge Plan and Services Additional resources added to the After Visit Summary for     Discharge Planning Services: CM Consult            DME Arranged: 3-N-1 DME Agency: [facility] Date DME Agency Contacted: 03/22/24 Time DME Agency Contacted: 1110 Representative spoke with at DME Agency: [facility] HH Arranged: PT, OT HH Agency: [facility] Date [facility] Agency Contacted: 03/20/24 Time HH Agency Contacted: 1551 Representative spoke with at [facility] Agency: [place]  Social Drivers of Health (SDOH) Interventions SDOH Screenings   Food Insecurity: No Food Insecurity (03/19/2024)  Housing: Low Risk  (03/19/2024)  Transportation Needs: No Transportation Needs (03/19/2024)  Utilities: Not At Risk (03/19/2024)  Social Connections: Socially Isolated (03/19/2024)  Tobacco Use: Low Risk  (03/19/2024)  Recent Concern: Tobacco Use - Medium Risk (03/18/2024)   Received from [facility]     Readmission Risk Interventions     No data to display

## 2024-03-22 NOTE — Progress Notes (Signed)
 Physical Therapy Treatment Patient Details Name: Erika Nelson MRN: [id number] DOB: 1941/09/23 Today's Date: 03/22/2024   History of Present Illness Pt is an 83 y.o. pleasant female with medical history significant for anxiety, mild dementia, diverticulosis, GERD, osteoarthritis, hypertension and dyslipidemia, who presented to the emergency room with acute onset of right hip pain after having a mechanical fall. Pt diagnosed with right hip impacted femoral neck fracture and is s/p closed reduction percutaneous pinning.    PT Comments  BID session to address stairs.  She is able to walk 20' to stairs then goes up x 2 steps with min a x 1 and cues.  She does begin to struggle some with c/o pain and weakness.  Stated she has not had any pain meds since earlier this am and has been "sitting too long:"  she descends steps backwards and walks 20' back to chair.    Pt does have 4 steps into home with 1 rail.  She struggled today due to fatigue and pain.  Per discussion with son, he stated ortho MD's open to another night if needed.  Son would like to have pt stay again tonight with discharge tomorrow after 2:00 when he gets off work.  Will plan to address stairs in AM.  Discussed safety at home and taking pain medications as scheduled for better pain control to allow for safety and more functional mobility.  Voiced understanding.   If plan is discharge home, recommend the following: Supervision due to cognitive status;A little help with bathing/dressing/bathroom;Assistance with cooking/housework;Help with stairs or ramp for entrance;Assist for transportation;A little help with walking and/or transfers   Can travel by private vehicle        Equipment Recommendations  Rolling walker (2 wheels);BSC/3in1    Recommendations for Other Services       Precautions / Restrictions Precautions Precautions: Fall Restrictions Weight Bearing Restrictions Per Provider Order: Yes RLE Weight Bearing Per Provider  Order: Weight bearing as tolerated     Mobility  Bed Mobility Overal bed mobility: Needs Assistance Bed Mobility: Sit to Supine     Supine to sit: Contact guard, Used rails Sit to supine: Supervision     Patient Response: Cooperative  Transfers Overall transfer level: Needs assistance Equipment used: Rolling walker (2 wheels) Transfers: Sit to/from Stand Sit to Stand: Min assist           General transfer comment: initially needed mod a but progressed to min a during session with good improvement    Ambulation/Gait Ambulation/Gait assistance: Contact guard assist, Min assist Gait Distance (Feet): 40 Feet Assistive device: Rolling walker (2 wheels) Gait Pattern/deviations: Step-through pattern, Decreased step length - right, Decreased step length - left, Trunk flexed Gait velocity: decreased     General Gait Details: fatigued this pm   Stairs Stairs: Yes Stairs assistance: Min assist Stair Management: Two rails, One rail Left Number of Stairs: 2 General stair comments: struggled this pm.  hesitant to go more than 2 steps.  once on steps stated she hasn't had pain meds since this am.  c/o legs feeling weak and going to buckle   Wheelchair Mobility     Tilt Bed Tilt Bed Patient Response: Cooperative  Modified Rankin (Stroke Patients Only)       Balance Overall balance assessment: Needs assistance, History of Falls Sitting-balance support: Feet supported Sitting balance-Leahy Scale: Good     Standing balance support: Bilateral upper extremity supported, During functional activity Standing balance-Leahy Scale: Fair  Communication    Cognition Arousal: Alert Behavior During Therapy: WFL for tasks assessed/performed   PT - Cognitive impairments: No apparent impairments                                Cueing    Exercises      General Comments        Pertinent Vitals/Pain Pain  Assessment Pain Assessment: Faces Faces Pain Scale: Hurts even more Pain Location: R hip Pain Intervention(s): Limited activity within patient's tolerance, Monitored during session, Repositioned    Home Living                          Prior Function            PT Goals (current goals can now be found in the care plan section) Progress towards PT goals: Progressing toward goals    Frequency    BID      PT Plan      Co-evaluation              AM-PAC PT "6 Clicks" Mobility   Outcome Measure  Help needed turning from your back to your side while in a flat bed without using bedrails?: A Little Help needed moving from lying on your back to sitting on the side of a flat bed without using bedrails?: A Little Help needed moving to and from a bed to a chair (including a wheelchair)?: A Little Help needed standing up from a chair using your arms (e.g., wheelchair or bedside chair)?: A Little Help needed to walk in hospital room?: A Little Help needed climbing 3-5 steps with a railing? : A Little 6 Click Score: 18    End of Session Equipment Utilized During Treatment: Gait belt Activity Tolerance: Patient limited by pain Patient left: with chair alarm set;in chair;with call bell/phone within reach Nurse Communication: Mobility status;Weight bearing status PT Visit Diagnosis: History of falling (Z91.81);Other abnormalities of gait and mobility (R26.89);Muscle weakness (generalized) (M62.81);Pain Pain - Right/Left: Right Pain - part of body: Hip     Time: 6962-9528 PT Time Calculation (min) (ACUTE ONLY): 14 min  Charges:    $Gait Training: 8-22 mins PT General Charges $$ ACUTE PT VISIT: 1 Visit                   Danielle Dess, PTA 03/22/24, 2:15 PM

## 2024-03-22 NOTE — Progress Notes (Signed)
    Durable Medical Equipment  (From admission, onward)           Start     Ordered   03/22/24 1042  For home use only DME Bedside commode  Once       Question:  Patient needs a bedside commode to treat with the following condition  Answer:  Generalized weakness   03/22/24 1041   03/20/24 1114  For home use only DME Walker rolling  Once       Question Answer Comment  Walker: With 5 Inch Wheels   Patient needs a walker to treat with the following condition Generalized weakness      03/20/24 1113           Note: RW was already ordered via [employer].

## 2024-03-22 NOTE — Progress Notes (Signed)
 Physical Therapy Treatment Patient Details Name: Erika Nelson MRN: [id number] DOB: May 13, 1941 Today's Date: 03/22/2024   History of Present Illness Pt is an 83 y.o. pleasant female with medical history significant for anxiety, mild dementia, diverticulosis, GERD, osteoarthritis, hypertension and dyslipidemia, who presented to the emergency room with acute onset of right hip pain after having a mechanical fall. Pt diagnosed with right hip impacted femoral neck fracture and is s/p closed reduction percutaneous pinning.    PT Comments  Pt ready for session.  She is able to get to EOB with rails on her own.  Initially struggles to stand but is able on second attempt to transfer to [facility] due to large BM smear in bed and completed BM on BSC.  She stands and needs assist for care before progressing gait 53' with RW and cga x 1.  Denied pain during session.  Discussed with son.  Plan for DC home with 24 hour supports.  Encouraged +1 for all mobility for safety.  Given gait belt for home.  Will benefit from RW and [facility] upon discharge.   If plan is discharge home, recommend the following: Supervision due to cognitive status;A little help with bathing/dressing/bathroom;Assistance with cooking/housework;Help with stairs or ramp for entrance;Assist for transportation;A little help with walking and/or transfers   Can travel by private vehicle        Equipment Recommendations  Rolling walker (2 wheels) , BSC   Recommendations for Other Services       Precautions / Restrictions Precautions Precautions: Fall Restrictions Weight Bearing Restrictions Per Provider Order: Yes RLE Weight Bearing Per Provider Order: Weight bearing as tolerated     Mobility  Bed Mobility Overal bed mobility: Needs Assistance Bed Mobility: Supine to Sit     Supine to sit: Contact guard, Used rails       Patient Response: Cooperative  Transfers Overall transfer level: Needs assistance Equipment used: Rolling walker  (2 wheels)   Sit to Stand: Min assist, Mod assist           General transfer comment: initially needed mod a but progressed to min a during session with good improvement    Ambulation/Gait Ambulation/Gait assistance: Contact guard assist, Min assist Gait Distance (Feet): 80 Feet Assistive device: Rolling walker (2 wheels) Gait Pattern/deviations: Step-through pattern, Decreased step length - right, Decreased step length - left, Trunk flexed Gait velocity: decreased     General Gait Details: initially struggled to step but progressed to good quality gait limited by fatigue but overall does well   Stairs             Wheelchair Mobility     Tilt Bed Tilt Bed Patient Response: Cooperative  Modified Rankin (Stroke Patients Only)       Balance Overall balance assessment: Needs assistance, History of Falls Sitting-balance support: Feet supported Sitting balance-Leahy Scale: Good     Standing balance support: Bilateral upper extremity supported, During functional activity Standing balance-Leahy Scale: Fair                              Secretary/administrator Comments  Pertinent Vitals/Pain Pain Assessment Pain Assessment: No/denies pain    Home Living                          Prior Function            PT Goals (current goals can now be found in the care plan section) Progress towards PT goals: Progressing toward goals    Frequency    BID      PT Plan      Co-evaluation              AM-PAC PT "6 Clicks" Mobility   Outcome Measure  Help needed turning from your back to your side while in a flat bed without using bedrails?: A Little Help needed moving from lying on your back to sitting on the side of a flat bed without using bedrails?: A Little Help needed moving to and from a bed to a chair (including a  wheelchair)?: A Little Help needed standing up from a chair using your arms (e.g., wheelchair or bedside chair)?: A Little Help needed to walk in hospital room?: A Little Help needed climbing 3-5 steps with a railing? : A Little 6 Click Score: 18    End of Session Equipment Utilized During Treatment: Gait belt Activity Tolerance: Patient limited by pain Patient left: with chair alarm set;in chair;with call bell/phone within reach Nurse Communication: Mobility status;Weight bearing status PT Visit Diagnosis: History of falling (Z91.81);Other abnormalities of gait and mobility (R26.89);Muscle weakness (generalized) (M62.81);Pain Pain - Right/Left: Right Pain - part of body: Hip     Time: 0937-1000 PT Time Calculation (min) (ACUTE ONLY): 23 min  Charges:    $Gait Training: 23-37 mins PT General Charges $$ ACUTE PT VISIT: 1 Visit                   Danielle Dess, PTA 03/22/24, 10:14 AM

## 2024-03-22 NOTE — Progress Notes (Signed)
 PROGRESS NOTE    Erika Nelson  WGN:[id number] DOB: [birth year] DOA: 03/18/2024 PCP: Danella Penton, MD   Assessment & Plan:   Principal Problem:   Closed right hip fracture, initial encounter [facility]) Active Problems:   Uncontrolled hypertension   Hypothyroidism   Anxiety and depression   Dyslipidemia   Type 2 diabetes mellitus without complications (HCC)   Subcapital fracture of hip, right, closed, initial encounter (HCC)  Assessment and Plan: Closed right hip fracture: s/p right hip closed reduction percutaneous pinning 03/19/24 as per ortho surg.  Norco, morphine prn pain. Still has a lot of pain. Repeat XR shows no new fracture & hardware is stable.   HTN: continue on ARB   DM2: well controlled, HbA1c 6.0. Continue w/ SSI w/ accuchecks  HLD: continue on statin    Depression: severity unknown. Continue on home dose of paroxetine   Anxiety: severity unknown.Continue on home dose of xanax    Hypothyroidism: continue on home dose of levothyroxine   Obesity: BMI 31.4. Would benefit from weight loss    DVT prophylaxis: SCDs Code Status: full  Family Communication: discussed pt's care w/ pt's son at bedside and answered his questions Disposition Plan: d/c home w/ HH   Level of care: Med-Surg   Status is: Inpatient Remains inpatient appropriate because: severity of illness, still w/ a lot of pain and did not do well w/ steps today w/ PT   Consultants:  Ortho surg   Procedures:   Antimicrobials:    Subjective: Pt c/o right hip pain   Objective: Vitals:   03/21/24 1741 03/21/24 2000 03/22/24 0415 03/22/24 0822  BP: 128/86 (!) 143/60 (!) 142/60 (!) 149/65  Pulse: 75 83 75 70  Resp: 16   17  Temp: 98 F (36.7 C) 98.4 F (36.9 C) 98.8 F (37.1 C) 98.6 F (37 C)  TempSrc:  Oral Oral   SpO2: 98% 96% 97% 97%  Weight:      Height:        Intake/Output Summary (Last 24 hours) at 03/22/2024 1459 Last data filed at 03/22/2024 0428 Gross per 24 hour  Intake 480  ml  Output 600 ml  Net -120 ml   Filed Weights   03/18/24 1711 03/18/24 1715  Weight: 83 kg 83 kg    Examination:  General exam: Appears calm & comfortable  Respiratory system: clear breath sounds b/l Cardiovascular system: S1/S2+. No rubs or gallops Gastrointestinal system: abd is soft, NT, obese & hypoactive bowel sounds Central nervous system: alert & oriented. Moves all extremities  Psychiatry: judgement and insight appears at baseline. Flat mood and affect    Data Reviewed: I have personally reviewed following labs and imaging studies  CBC: Recent Labs  Lab 03/19/24 0041 03/19/24 0526 03/20/24 0408 03/21/24 0553 03/22/24 0154  WBC 8.1 7.5 11.2* 9.2 8.9  HGB 14.3 14.1 12.6 12.3 11.7*  HCT 41.5 40.0 35.7* 35.9* 35.1*  MCV 87.4 84.6 84.4 87.6 89.1  PLT 194 206 204 187 180   Basic Metabolic Panel: Recent Labs  Lab 03/19/24 0041 03/19/24 0526 03/20/24 0408 03/21/24 0553 03/22/24 0154  NA 139 140 137 137 135  K 3.8 3.8 4.4 4.0 4.5  CL 106 105 105 107 106  CO2 22 24 22  21* 24  GLUCOSE 164* 103* 182* 198* 129*  BUN 30* 30* 30* 42* 40*  CREATININE 1.24* 1.09* 1.39* 1.40* 1.26*  CALCIUM 9.9 9.9 9.1 9.0 8.7*   GFR: Estimated Creatinine Clearance: 36.5 mL/min (A) (by C-G  formula based on SCr of 1.26 mg/dL (H)). Liver Function Tests: No results for input(s): "AST", "ALT", "ALKPHOS", "BILITOT", "PROT", "ALBUMIN" in the last 168 hours. No results for input(s): "LIPASE", "AMYLASE" in the last 168 hours. No results for input(s): "AMMONIA" in the last 168 hours. Coagulation Profile: No results for input(s): "INR", "PROTIME" in the last 168 hours. Cardiac Enzymes: No results for input(s): "CKTOTAL", "CKMB", "CKMBINDEX", "TROPONINI" in the last 168 hours. BNP (last 3 results) No results for input(s): "PROBNP" in the last 8760 hours. HbA1C: No results for input(s): "HGBA1C" in the last 72 hours.  CBG: Recent Labs  Lab 03/21/24 1155 03/21/24 1840 03/21/24 2000  03/22/24 0823 03/22/24 1126  GLUCAP 156* 123* 160* 138* 219*   Lipid Profile: No results for input(s): "CHOL", "HDL", "LDLCALC", "TRIG", "CHOLHDL", "LDLDIRECT" in the last 72 hours. Thyroid Function Tests: No results for input(s): "TSH", "T4TOTAL", "FREET4", "T3FREE", "THYROIDAB" in the last 72 hours. Anemia Panel: No results for input(s): "VITAMINB12", "FOLATE", "FERRITIN", "TIBC", "IRON", "RETICCTPCT" in the last 72 hours. Sepsis Labs: No results for input(s): "PROCALCITON", "LATICACIDVEN" in the last 168 hours.  Recent Results (from the past 240 hours)  MRSA Next Gen by PCR, Nasal     Status: None   Collection Time: 03/19/24  6:47 AM   Specimen: Nasal Mucosa; Nasal Swab  Result Value Ref Range Status   MRSA by PCR Next Gen NOT DETECTED NOT DETECTED Final    Comment: (NOTE) The GeneXpert MRSA Assay (FDA approved for NASAL specimens only), is one component of a comprehensive MRSA colonization surveillance program. It is not intended to diagnose MRSA infection nor to guide or monitor treatment for MRSA infections. Test performance is not FDA approved in patients less than 82 years old. Performed at [facility], [address] [place], [place] [address]          Radiology Studies: DG FEMUR PORT, MIN 2 VIEWS RIGHT Result Date: 03/21/2024 CLINICAL DATA:  Right hip pain following cannulated hip pinning azygo. Pain radiates down right leg. EXAM: RIGHT FEMUR PORTABLE 2 VIEW COMPARISON:  Right knee radiographs 03/27/2024, intraoperative fluoroscopy right hip 03/27/2024, pelvis and right hip radiographs 03/18/2024, CT right hip 03/18/2024 FINDINGS: Interval fixation of the previously seen proximal right femoral fracture with 3 longitudinal screws. Redemonstration of total right knee arthroplasty. No perihardware lucency is seen to indicate hardware failure or loosening. No knee joint effusion. Mild to moderate right femoroacetabular joint space narrowing. Mild pubic symphysis  joint space narrowing and subchondral sclerosis. Mild-to-moderate inferior right sacroiliac subchondral sclerosis. Expected postoperative lateral right hip subcutaneous air. Chronic mineralized densities are again seen lateral to the right ilium. IMPRESSION: Interval fixation of the previously seen proximal right femoral fracture with 3 longitudinal screws. No evidence of hardware failure or loosening. Electronically Signed   By: Neita Garnet M.D.   On: 03/21/2024 12:14        Scheduled Meds:  docusate sodium  100 mg Oral BID   enoxaparin (LOVENOX) injection  40 mg Subcutaneous Q24H   fluticasone furoate-vilanterol  1 puff Inhalation Daily   insulin aspart  0-5 Units Subcutaneous QHS   insulin aspart  0-9 Units Subcutaneous TID WC   irbesartan  37.5 mg Oral Daily   levothyroxine  75 mcg Oral QAC breakfast   multivitamin with minerals  1 tablet Oral Q2200   pantoprazole  40 mg Oral Daily   PARoxetine  20 mg Oral Daily   simvastatin  20 mg Oral QHS   Continuous Infusions:  LOS: 3 days       Charise Killian, MD [facility] Pager [phone]  If 7PM-7AM, please contact night-coverage [email or web address] 03/22/2024, 2:59 PM

## 2024-03-22 NOTE — Progress Notes (Signed)
   Subjective: 3 Days Post-Op Procedure(s) (LRB): PINNING, EXTREMITY, PERCUTANEOUS (Right) Patient reports pain as mild.   Patient is well, and has had no acute complaints or problems Denies any CP, SOB, ABD pain. + BM We will continue therapy today.  Plan is to go Home after hospital stay.  Objective: Vital signs in last 24 hours: Temp:  [98 F (36.7 C)-98.8 F (37.1 C)] 98.8 F (37.1 C) (03/23 0415) Pulse Rate:  [75-83] 75 (03/23 0415) Resp:  [16] 16 (03/22 1741) BP: (128-143)/(60-86) 142/60 (03/23 0415) SpO2:  [96 %-98 %] 97 % (03/23 0415)  Intake/Output from previous day: 03/22 0701 - 03/23 0700 In: 480 [P.O.:480] Out: 600 [Urine:600] Intake/Output this shift: No intake/output data recorded.  Recent Labs    03/20/24 0408 03/21/24 0553 03/22/24 0154  HGB 12.6 12.3 11.7*   Recent Labs    03/21/24 0553 03/22/24 0154  WBC 9.2 8.9  RBC 4.10 3.94  HCT 35.9* 35.1*  PLT 187 180   Recent Labs    03/21/24 0553 03/22/24 0154  NA 137 135  K 4.0 4.5  CL 107 106  CO2 21* 24  BUN 42* 40*  CREATININE 1.40* 1.26*  GLUCOSE 198* 129*  CALCIUM 9.0 8.7*   No results for input(s): "LABPT", "INR" in the last 72 hours.  EXAM General - Patient is Alert, Appropriate, and Oriented Extremity - Neurovascular intact Sensation intact distally Intact pulses distally Dorsiflexion/Plantar flexion intact No cellulitis present Compartment soft Dressing - dressing C/D/I and no drainage Motor Function - intact, moving foot and toes well on exam.   Past Medical History:  Diagnosis Date   Anxiety    Cervicalgia    Diverticulosis    Generalized OA    Osteoarthritis bilateral knees   GERD (gastroesophageal reflux disease)    Headache    Hyperlipidemia    Hypertension    Occasional tremors     Assessment/Plan:   3 Days Post-Op Procedure(s) (LRB): PINNING, EXTREMITY, PERCUTANEOUS (Right) Principal Problem:   Closed right hip fracture, initial encounter (HCC) Active  Problems:   Uncontrolled hypertension   Hypothyroidism   Anxiety and depression   Dyslipidemia   Type 2 diabetes mellitus without complications (HCC)   Subcapital fracture of hip, right, closed, initial encounter (HCC)  Estimated body mass index is 31.41 kg/m as calculated from the following:   Height as of this encounter: 5\' 4"  (1.626 m).   Weight as of this encounter: 83 kg. Advance diet Up with therapy Pain well-controlled Labs and vital signs are stable Care management to assist with discharge to home with HHPT today pending safe progress with PT.     Follow-up with [facility] orthopedics in 2 weeks for wound check and x-rays Lovenox 40 mg subcu daily x 14 days at discharge Patient can begin showering in 3 days.  Patient can remove honeycomb dressing in 1 week and shower with no dressing over the incision site.   DVT Prophylaxis - Lovenox, TED hose, and SCDs Weight-Bearing as tolerated to right leg   T. Cranston Neighbor, PA-C [facility] 03/22/2024, 8:09 AM

## 2024-03-23 DIAGNOSIS — S72001A Fracture of unspecified part of neck of right femur, initial encounter for closed fracture: Secondary | ICD-10-CM | POA: Diagnosis not present

## 2024-03-23 LAB — BASIC METABOLIC PANEL
Anion gap: 6 (ref 5–15)
BUN: 41 mg/dL — ABNORMAL HIGH (ref 8–23)
CO2: 25 mmol/L (ref 22–32)
Calcium: 9.6 mg/dL (ref 8.9–10.3)
Chloride: 106 mmol/L (ref 98–111)
Creatinine, Ser: 1.38 mg/dL — ABNORMAL HIGH (ref 0.44–1.00)
GFR, Estimated: 38 mL/min — ABNORMAL LOW (ref >60)
Glucose, Bld: 131 mg/dL — ABNORMAL HIGH (ref 70–99)
Potassium: 4.5 mmol/L (ref 3.5–5.1)
Sodium: 137 mmol/L (ref 135–145)

## 2024-03-23 LAB — CBC
HCT: 35.8 % — ABNORMAL LOW (ref 36.0–46.0)
Hemoglobin: 12.3 g/dL (ref 12.0–15.0)
MCH: 29.8 pg (ref 26.0–34.0)
MCHC: 34.4 g/dL (ref 30.0–36.0)
MCV: 86.7 fL (ref 80.0–100.0)
Platelets: 194 10*3/uL (ref 150–400)
RBC: 4.13 MIL/uL (ref 3.87–5.11)
RDW: 13 % (ref 11.5–15.5)
WBC: 8 10*3/uL (ref 4.0–10.5)
nRBC: 0 % (ref 0.0–0.2)

## 2024-03-23 LAB — GLUCOSE, CAPILLARY
Glucose-Capillary: 193 mg/dL — ABNORMAL HIGH (ref 70–99)
Glucose-Capillary: 142 mg/dL — ABNORMAL HIGH (ref 70–99)
Glucose-Capillary: 268 mg/dL — ABNORMAL HIGH (ref 70–99)
Glucose-Capillary: 113 mg/dL — ABNORMAL HIGH (ref 70–99)
Glucose-Capillary: 167 mg/dL — ABNORMAL HIGH (ref 70–99)

## 2024-03-23 NOTE — Care Management Important Message (Signed)
 Important Message  Patient Details  Name: Erika Nelson MRN: [id number] Date of Birth: [birth year]   Important Message Given:  Yes - Medicare IM     Cristela Blue, CMA 03/23/2024, 12:39 PM

## 2024-03-23 NOTE — Progress Notes (Signed)
 Physical Therapy Treatment Patient Details Name: Erika Nelson MRN: [id number] DOB: 1941/06/13 Today's Date: 03/23/2024   History of Present Illness Pt is an 83 y.o. pleasant female with medical history significant for anxiety, mild dementia, diverticulosis, GERD, osteoarthritis, hypertension and dyslipidemia, who presented to the emergency room with acute onset of right hip pain after having a mechanical fall. Pt diagnosed with right hip impacted femoral neck fracture and is s/p closed reduction percutaneous pinning.    PT Comments  Pt in chair.  Attempted to stand to RW but needed heavy mod a to try to stand and didn't get fully upright due to L knee pain.  She stated she did get to [facility] with tech earlier this PM.   Discussed at length with pt and son DC plan.  Voiced concerns regarding stairs and limited mobility this PM session.  At this time pt and son on board with SNF and agreed to bed search.  Relayed to TOC to start process.  Discussed need to consider a ramp for eventual discharge home.    Of note pt continues to c/o inc L knee pain.  Will reach out to MD regarding continued pain and affect on mobility this session.  A loud pop was audible when L knee buckled on stairs this am.     If plan is discharge home, recommend the following: Supervision due to cognitive status;A little help with bathing/dressing/bathroom;Assistance with cooking/housework;Help with stairs or ramp for entrance;Assist for transportation;Two people to help with walking and/or transfers   Can travel by private vehicle        Equipment Recommendations       Recommendations for Other Services       Precautions / Restrictions Precautions Precautions: Fall Restrictions Weight Bearing Restrictions Per Provider Order: Yes RLE Weight Bearing Per Provider Order: Weight bearing as tolerated     Mobility  Bed Mobility Overal bed mobility: Needs Assistance Bed Mobility: Sit to Supine     Supine to sit: Used  rails, Supervision     General bed mobility comments: in chair before and after Patient Response: Cooperative  Transfers Overall transfer level: Needs assistance Equipment used: Rolling walker (2 wheels) Transfers: Sit to/from Stand Sit to Stand: Mod assist, Max assist           General transfer comment: difficulty standing fully this session due to pain    Ambulation/Gait Ambulation/Gait assistance: Contact guard assist, Min assist Gait Distance (Feet): 100 Feet Assistive device: Rolling walker (2 wheels) Gait Pattern/deviations: Step-through pattern, Decreased step length - right, Decreased step length - left, Trunk flexed Gait velocity: decreased     General Gait Details: increased pain today after stair attempts and gait   Stairs Stairs: Yes Stairs assistance: Mod assist, +2 physical assistance Stair Management: Two rails, Step to pattern Number of Stairs: 3 General stair comments: buckled on 3rd step sitting on my knee.  max cues to stand up and to descend backwards down steps to recliner   Wheelchair Mobility     Tilt Bed Tilt Bed Patient Response: Cooperative  Modified Rankin (Stroke Patients Only)       Balance Overall balance assessment: Needs assistance, History of Falls Sitting-balance support: Feet supported Sitting balance-Leahy Scale: Good     Standing balance support: Bilateral upper extremity supported, During functional activity Standing balance-Leahy Scale: Poor Standing balance comment: increased assist this pm due to pain  Communication Communication Communication: No apparent difficulties  Cognition Arousal: Alert Behavior During Therapy: WFL for tasks assessed/performed   PT - Cognitive impairments: No apparent impairments                         Following commands: Intact      Cueing Cueing Techniques: Verbal cues, Visual cues, Tactile cues  Exercises Other Exercises Other  Exercises: seated AROM    General Comments        Pertinent Vitals/Pain Pain Assessment Pain Assessment: Faces Faces Pain Scale: Hurts whole lot Pain Location: L knee with WB Pain Descriptors / Indicators: Sharp, Aching Pain Intervention(s): Repositioned, Limited activity within patient's tolerance, Monitored during session, Premedicated before session    Home Living                          Prior Function            PT Goals (current goals can now be found in the care plan section) Progress towards PT goals: Progressing toward goals    Frequency    BID      PT Plan      Co-evaluation              AM-PAC PT "6 Clicks" Mobility   Outcome Measure  Help needed turning from your back to your side while in a flat bed without using bedrails?: A Little Help needed moving from lying on your back to sitting on the side of a flat bed without using bedrails?: A Little Help needed moving to and from a bed to a chair (including a wheelchair)?: A Lot Help needed standing up from a chair using your arms (e.g., wheelchair or bedside chair)?: A Lot Help needed to walk in hospital room?: A Lot Help needed climbing 3-5 steps with a railing? : A Lot 6 Click Score: 14    End of Session Equipment Utilized During Treatment: Gait belt Activity Tolerance: Patient limited by pain Patient left: with chair alarm set;in chair;with call bell/phone within reach Nurse Communication: Mobility status;Weight bearing status PT Visit Diagnosis: History of falling (Z91.81);Other abnormalities of gait and mobility (R26.89);Muscle weakness (generalized) (M62.81);Pain Pain - Right/Left: Right Pain - part of body: Hip     Time: 1610-9604 PT Time Calculation (min) (ACUTE ONLY): 16 min  Charges:    $Therapeutic Activity: 8-22 mins PT General Charges $$ ACUTE PT VISIT: 1 Visit                   Danielle Dess, PTA 03/23/24, 2:54 PM

## 2024-03-23 NOTE — Progress Notes (Signed)
 Physical Therapy Treatment Patient Details Name: Erika Nelson MRN: [id number] DOB: 1941-11-13 Today's Date: 03/23/2024   History of Present Illness Pt is an 83 y.o. pleasant female with medical history significant for anxiety, mild dementia, diverticulosis, GERD, osteoarthritis, hypertension and dyslipidemia, who presented to the emergency room with acute onset of right hip pain after having a mechanical fall. Pt diagnosed with right hip impacted femoral neck fracture and is s/p closed reduction percutaneous pinning.    PT Comments  Pt in bed.  Reports being comfortable.  Pain meds given 2 hours prior.  She is able to get to EOB with rail and supervision.  Stands to RW with min a x 1 and continued cues for hand placements throughout session.  She transfers to void and is inc of urine on floor during transfer. She is taken to rehab gym for stair training.  She stands and is able to go up 2 steps with min a x 1 and on 3rd step L Lknee buckles and she sits on my knee for support.  Mod a x 2 to stand and to descend steps backwards with max cues to focus on task.  After being wheeled back to room, she is able to walk 61' with RW and min a x 1 but fatigue limits safe return to recliner and she needs to sit in chair and recliner brought to her for general safety.  Discussed at length discharge plan.  She remains resistant to SNF at this time.  Stated she is not concerned about doing her 4 steps into her home upon discharge because she will have son and grandson to help her.  Reviewed she was not able to complete 4 here and ended up sitting on my knee due to buckling.  Safety concerns again reviewed.  She is encouraged to think and talk with family.  Household distances do continue to vary session to session depending on pain and fatigue.  Stated she has been considering a wheelchair and at this time, a wheelchair would be beneficial for community mobility and as needed in the home.  She does not have a ramp and  is encouraged to talk with family about installing one for safety accessing home.  A temporary ramp would be recommended if they opt to return home at this time.   Patient suffers from hip fracture which impairs his/her ability to perform daily activities like toileting, feeding, dressing, grooming, bathing in the home. A cane, walker, crutch will not resolve the patient's issue with performing activities of daily living. A lightweight wheelchair and cushion is required/recommended and will allow patient to safely perform daily activities.   Patient can safely propel the wheelchair in the home or has a caregiver who can provide assistance.    At this time SNF is recommended for discharge due to inability to safely access her home.  She does remain resistant but is going to think about it.  Son to return around 2 today after work.  Will plan to see pt again this pm.  Discussed with team.   If plan is discharge home, recommend the following: Supervision due to cognitive status;A little help with bathing/dressing/bathroom;Assistance with cooking/housework;Help with stairs or ramp for entrance;Assist for transportation;A little help with walking and/or transfers   Can travel by private vehicle        Equipment Recommendations       Recommendations for Other Services       Precautions / Restrictions Precautions Precautions: Fall Restrictions Weight Bearing  Restrictions Per Provider Order: Yes RLE Weight Bearing Per Provider Order: Weight bearing as tolerated     Mobility  Bed Mobility Overal bed mobility: Needs Assistance Bed Mobility: Sit to Supine     Supine to sit: Used rails, Supervision       Patient Response: Cooperative  Transfers Overall transfer level: Needs assistance Equipment used: Rolling walker (2 wheels) Transfers: Sit to/from Stand Sit to Stand: Min assist           General transfer comment: continues to need cues for hand placements     Ambulation/Gait Ambulation/Gait assistance: Contact guard assist, Min assist Gait Distance (Feet): 100 Feet Assistive device: Rolling walker (2 wheels) Gait Pattern/deviations: Step-through pattern, Decreased step length - right, Decreased step length - left, Trunk flexed Gait velocity: decreased     General Gait Details: increased pain today after stair attempts and gait   Stairs Stairs: Yes Stairs assistance: Mod assist, +2 physical assistance Stair Management: Two rails, Step to pattern Number of Stairs: 3 General stair comments: buckled on 3rd step sitting on my knee.  max cues to stand up and to descend backwards down steps to recliner   Wheelchair Mobility     Tilt Bed Tilt Bed Patient Response: Cooperative  Modified Rankin (Stroke Patients Only)       Balance Overall balance assessment: Needs assistance, History of Falls Sitting-balance support: Feet supported Sitting balance-Leahy Scale: Good     Standing balance support: Bilateral upper extremity supported, During functional activity Standing balance-Leahy Scale: Fair                              Hotel manager: No apparent difficulties  Cognition Arousal: Alert Behavior During Therapy: WFL for tasks assessed/performed   PT - Cognitive impairments: No apparent impairments                         Following commands: Intact      Cueing Cueing Techniques: Verbal cues, Visual cues, Tactile cues  Exercises      General Comments        Pertinent Vitals/Pain Pain Assessment Pain Assessment: Faces Faces Pain Scale: Hurts little more Pain Location: R hip Pain Descriptors / Indicators: Sharp, Aching Pain Intervention(s): Limited activity within patient's tolerance, Monitored during session, Premedicated before session, Repositioned    Home Living                          Prior Function            PT Goals (current goals can now be  found in the care plan section) Progress towards PT goals: Progressing toward goals    Frequency    BID      PT Plan      Co-evaluation              AM-PAC PT "6 Clicks" Mobility   Outcome Measure  Help needed turning from your back to your side while in a flat bed without using bedrails?: A Little Help needed moving from lying on your back to sitting on the side of a flat bed without using bedrails?: A Little Help needed moving to and from a bed to a chair (including a wheelchair)?: A Little Help needed standing up from a chair using your arms (e.g., wheelchair or bedside chair)?: A Little Help needed to walk in hospital room?: A  Little Help needed climbing 3-5 steps with a railing? : A Lot 6 Click Score: 17    End of Session Equipment Utilized During Treatment: Gait belt Activity Tolerance: Patient limited by pain Patient left: with chair alarm set;in chair;with call bell/phone within reach Nurse Communication: Mobility status;Weight bearing status PT Visit Diagnosis: History of falling (Z91.81);Other abnormalities of gait and mobility (R26.89);Muscle weakness (generalized) (M62.81);Pain Pain - Right/Left: Right Pain - part of body: Hip     Time: 0825-0911 PT Time Calculation (min) (ACUTE ONLY): 46 min  Charges:    $Gait Training: 38-52 mins PT General Charges $$ ACUTE PT VISIT: 1 Visit                   Danielle Dess, PTA 03/23/24, 9:51 AM

## 2024-03-23 NOTE — Plan of Care (Signed)
  Problem: Education: Goal: Knowledge of General Education information will improve Description: Including pain rating scale, medication(s)/side effects and non-pharmacologic comfort measures Outcome: Progressing   Problem: Health Behavior/Discharge Planning: Goal: Ability to manage health-related needs will improve Outcome: Progressing   Problem: Clinical Measurements: Goal: Ability to maintain clinical measurements within normal limits will improve Outcome: Progressing Goal: Will remain free from infection Outcome: Progressing Goal: Diagnostic test results will improve Outcome: Progressing Goal: Respiratory complications will improve Outcome: Progressing Goal: Cardiovascular complication will be avoided Outcome: Progressing   Problem: Activity: Goal: Risk for activity intolerance will decrease Outcome: Progressing   Problem: Nutrition: Goal: Adequate nutrition will be maintained Outcome: Progressing   Problem: Coping: Goal: Level of anxiety will decrease Outcome: Progressing   Problem: Elimination: Goal: Will not experience complications related to bowel motility Outcome: Progressing Goal: Will not experience complications related to urinary retention Outcome: Progressing   Problem: Pain Managment: Goal: General experience of comfort will improve and/or be controlled Outcome: Progressing   Problem: Safety: Goal: Ability to remain free from injury will improve Outcome: Progressing   Problem: Skin Integrity: Goal: Risk for impaired skin integrity will decrease Outcome: Progressing   Problem: Education: Goal: Ability to describe self-care measures that may prevent or decrease complications (Diabetes Survival Skills Education) will improve Outcome: Progressing Goal: Individualized Educational Video(s) Outcome: Progressing   Problem: Coping: Goal: Ability to adjust to condition or change in health will improve Outcome: Progressing   Problem: Fluid  Volume: Goal: Ability to maintain a balanced intake and output will improve Outcome: Progressing   Problem: Health Behavior/Discharge Planning: Goal: Ability to identify and utilize available resources and services will improve Outcome: Progressing Goal: Ability to manage health-related needs will improve Outcome: Progressing   Problem: Metabolic: Goal: Ability to maintain appropriate glucose levels will improve Outcome: Progressing   Problem: Nutritional: Goal: Maintenance of adequate nutrition will improve Outcome: Progressing Goal: Progress toward achieving an optimal weight will improve Outcome: Progressing   Problem: Skin Integrity: Goal: Risk for impaired skin integrity will decrease Outcome: Progressing   Problem: Tissue Perfusion: Goal: Adequacy of tissue perfusion will improve Outcome: Progressing   Problem: Education: Goal: Verbalization of understanding the information provided (i.e., activity precautions, restrictions, etc) will improve Outcome: Progressing Goal: Individualized Educational Video(s) Outcome: Progressing   Problem: Activity: Goal: Ability to ambulate and perform ADLs will improve Outcome: Progressing   Problem: Clinical Measurements: Goal: Postoperative complications will be avoided or minimized Outcome: Progressing   Problem: Self-Concept: Goal: Ability to maintain and perform role responsibilities to the fullest extent possible will improve Outcome: Progressing   Problem: Pain Management: Goal: Pain level will decrease Outcome: Progressing

## 2024-03-23 NOTE — Progress Notes (Signed)
 PROGRESS NOTE    Erika Nelson  ZOX:[id number] DOB: 09/10/1941 DOA: 03/18/2024 PCP: Danella Penton, MD   Assessment & Plan:   Principal Problem:   Closed right hip fracture, initial encounter [facility]) Active Problems:   Uncontrolled hypertension   Hypothyroidism   Anxiety and depression   Dyslipidemia   Type 2 diabetes mellitus without complications (HCC)   Subcapital fracture of hip, right, closed, initial encounter (HCC)  Assessment and Plan: Closed right hip fracture: s/p right hip closed reduction percutaneous pinning 03/19/24 as per ortho surg. Norco, morphine prn for pain. Difficulty working w/ PT. PT is now recommending SNF.  Pt wants to discuss w/ her family   HTN: continue on ARB   DM2: well controlled, HbA1c 6.0. Continue on SSI w/ accuchecks   HLD: continue on statin     Depression: severity unknown. Continue on home dose of paroxetine   Anxiety: severity unknown. Continue on home dose of xanax    Hypothyroidism: continue on home dose of levothyroxine   Obesity: BMI 31.4. Would benefit from weight loss     DVT prophylaxis: SCDs Code Status: full  Family Communication: discussed pt's care w/ pt's son at bedside and answered his questions Disposition Plan: d/c home w/ HH   Level of care: Med-Surg   Status is: Inpatient Remains inpatient appropriate because: medically stable. PT now recs SNF but pt wants to discuss with her fam first   Consultants:  Ortho surg   Procedures:   Antimicrobials:    Subjective: Pt c/o hip pain   Objective: Vitals:   03/22/24 1942 03/23/24 0614 03/23/24 0746 03/23/24 1100  BP: (!) 157/72 (!) 150/70 (!) 144/72 121/61  Pulse: 81 67 64   Resp: 18 18 17    Temp: 99.3 F (37.4 C) 97.8 F (36.6 C) 97.9 F (36.6 C)   TempSrc: Oral Oral Oral   SpO2: 99% 98% 98%   Weight:      Height:        Intake/Output Summary (Last 24 hours) at 03/23/2024 1348 Last data filed at 03/23/2024 0630 Gross per 24 hour  Intake 960 ml   Output 1450 ml  Net -490 ml   Filed Weights   03/18/24 1711 03/18/24 1715  Weight: 83 kg 83 kg    Examination:  General exam: Appears sad  Respiratory system: clear breath sounds b/l  Cardiovascular system: S1 & S2+. No rubs or gallops  Gastrointestinal system: abd is soft, NT, obese & normal bowel sounds  Central nervous system: alert & oriented. Moves all extremities  Psychiatry: judgement and insight appears normal. Flat mood and affect    Data Reviewed: I have personally reviewed following labs and imaging studies  CBC: Recent Labs  Lab 03/19/24 0526 03/20/24 0408 03/21/24 0553 03/22/24 0154 03/23/24 0602  WBC 7.5 11.2* 9.2 8.9 8.0  HGB 14.1 12.6 12.3 11.7* 12.3  HCT 40.0 35.7* 35.9* 35.1* 35.8*  MCV 84.6 84.4 87.6 89.1 86.7  PLT 206 204 187 180 194   Basic Metabolic Panel: Recent Labs  Lab 03/19/24 0526 03/20/24 0408 03/21/24 0553 03/22/24 0154 03/23/24 0602  NA 140 137 137 135 137  K 3.8 4.4 4.0 4.5 4.5  CL 105 105 107 106 106  CO2 24 22 21* 24 25  GLUCOSE 103* 182* 198* 129* 131*  BUN 30* 30* 42* 40* 41*  CREATININE 1.09* 1.39* 1.40* 1.26* 1.38*  CALCIUM 9.9 9.1 9.0 8.7* 9.6   GFR: Estimated Creatinine Clearance: 33.3 mL/min (A) (by C-G formula  based on SCr of 1.38 mg/dL (H)). Liver Function Tests: No results for input(s): "AST", "ALT", "ALKPHOS", "BILITOT", "PROT", "ALBUMIN" in the last 168 hours. No results for input(s): "LIPASE", "AMYLASE" in the last 168 hours. No results for input(s): "AMMONIA" in the last 168 hours. Coagulation Profile: No results for input(s): "INR", "PROTIME" in the last 168 hours. Cardiac Enzymes: No results for input(s): "CKTOTAL", "CKMB", "CKMBINDEX", "TROPONINI" in the last 168 hours. BNP (last 3 results) No results for input(s): "PROBNP" in the last 8760 hours. HbA1C: No results for input(s): "HGBA1C" in the last 72 hours.  CBG: Recent Labs  Lab 03/22/24 1126 03/22/24 1736 03/22/24 2133 03/23/24 0742  03/23/24 1154  GLUCAP 219* 134* 147* 142* 268*   Lipid Profile: No results for input(s): "CHOL", "HDL", "LDLCALC", "TRIG", "CHOLHDL", "LDLDIRECT" in the last 72 hours. Thyroid Function Tests: No results for input(s): "TSH", "T4TOTAL", "FREET4", "T3FREE", "THYROIDAB" in the last 72 hours. Anemia Panel: No results for input(s): "VITAMINB12", "FOLATE", "FERRITIN", "TIBC", "IRON", "RETICCTPCT" in the last 72 hours. Sepsis Labs: No results for input(s): "PROCALCITON", "LATICACIDVEN" in the last 168 hours.  Recent Results (from the past 240 hours)  MRSA Next Gen by PCR, Nasal     Status: None   Collection Time: 03/19/24  6:47 AM   Specimen: Nasal Mucosa; Nasal Swab  Result Value Ref Range Status   MRSA by PCR Next Gen NOT DETECTED NOT DETECTED Final    Comment: (NOTE) The GeneXpert MRSA Assay (FDA approved for NASAL specimens only), is one component of a comprehensive MRSA colonization surveillance program. It is not intended to diagnose MRSA infection nor to guide or monitor treatment for MRSA infections. Test performance is not FDA approved in patients less than 26 years old. Performed at [facility], [address] [place], [place] [address]          Radiology Studies: No results found.       Scheduled Meds:  docusate sodium  100 mg Oral BID   enoxaparin (LOVENOX) injection  40 mg Subcutaneous Q24H   fluticasone furoate-vilanterol  1 puff Inhalation Daily   insulin aspart  0-5 Units Subcutaneous QHS   insulin aspart  0-9 Units Subcutaneous TID WC   irbesartan  37.5 mg Oral Daily   levothyroxine  75 mcg Oral QAC breakfast   multivitamin with minerals  1 tablet Oral Q2200   pantoprazole  40 mg Oral Daily   PARoxetine  20 mg Oral Daily   simvastatin  20 mg Oral QHS   Continuous Infusions:     LOS: 4 days       Charise Killian, MD [facility] Pager [phone]  If 7PM-7AM, please contact night-coverage [email or web address] 03/23/2024,  1:48 PM

## 2024-03-23 NOTE — Progress Notes (Signed)
   Subjective: 4 Days Post-Op Procedure(s) (LRB): PINNING, EXTREMITY, PERCUTANEOUS (Right) Patient reports pain as mild.   Patient is well, and has had no acute complaints or problems Denies any CP, SOB, ABD pain. + BM We will continue therapy today.  Slow progress with PT , discussing SNF  Objective: Vital signs in last 24 hours: Temp:  [97.8 F (36.6 C)-99.3 F (37.4 C)] 97.9 F (36.6 C) (03/24 0746) Pulse Rate:  [64-81] 64 (03/24 0746) Resp:  [16-18] 17 (03/24 0746) BP: (144-157)/(67-72) 144/72 (03/24 0746) SpO2:  [98 %-99 %] 98 % (03/24 0746)  Intake/Output from previous day: 03/23 0701 - 03/24 0700 In: 960 [P.O.:960] Out: 1450 [Urine:1450] Intake/Output this shift: No intake/output data recorded.  Recent Labs    03/21/24 0553 03/22/24 0154 03/23/24 0602  HGB 12.3 11.7* 12.3   Recent Labs    03/22/24 0154 03/23/24 0602  WBC 8.9 8.0  RBC 3.94 4.13  HCT 35.1* 35.8*  PLT 180 194   Recent Labs    03/22/24 0154 03/23/24 0602  NA 135 137  K 4.5 4.5  CL 106 106  CO2 24 25  BUN 40* 41*  CREATININE 1.26* 1.38*  GLUCOSE 129* 131*  CALCIUM 8.7* 9.6   No results for input(s): "LABPT", "INR" in the last 72 hours.  EXAM General - Patient is Alert, Appropriate, and Oriented Extremity - Neurovascular intact Sensation intact distally Intact pulses distally Dorsiflexion/Plantar flexion intact No cellulitis present Compartment soft Dressing - dressing C/D/I and no drainage Motor Function - intact, moving foot and toes well on exam.   Past Medical History:  Diagnosis Date   Anxiety    Cervicalgia    Diverticulosis    Generalized OA    Osteoarthritis bilateral knees   GERD (gastroesophageal reflux disease)    Headache    Hyperlipidemia    Hypertension    Occasional tremors     Assessment/Plan:   4 Days Post-Op Procedure(s) (LRB): PINNING, EXTREMITY, PERCUTANEOUS (Right) Principal Problem:   Closed right hip fracture, initial encounter  (HCC) Active Problems:   Uncontrolled hypertension   Hypothyroidism   Anxiety and depression   Dyslipidemia   Type 2 diabetes mellitus without complications (HCC)   Subcapital fracture of hip, right, closed, initial encounter (HCC)  Estimated body mass index is 31.41 kg/m as calculated from the following:   Height as of this encounter: 5\' 4"  (1.626 m).   Weight as of this encounter: 83 kg. Advance diet Up with therapy Pain well-controlled Labs and vital signs are stable Care management to assist with discharge to SNF, slow progress with PT    Follow-up with [facility] orthopedics in 2 weeks for wound check and x-rays Lovenox 40 mg subcu daily x 14 days at discharge Patient can begin showering in 3 days.  Patient can remove honeycomb dressing in 1 week and shower with no dressing over the incision site.   DVT Prophylaxis - Lovenox, TED hose, and SCDs Weight-Bearing as tolerated to right leg   T. Cranston Neighbor, PA-C [facility] 03/23/2024, 10:54 AM

## 2024-03-24 ENCOUNTER — Inpatient Hospital Stay

## 2024-03-24 DIAGNOSIS — S72001A Fracture of unspecified part of neck of right femur, initial encounter for closed fracture: Secondary | ICD-10-CM | POA: Diagnosis not present

## 2024-03-24 LAB — CBC
HCT: 36.1 % (ref 36.0–46.0)
Hemoglobin: 12.4 g/dL (ref 12.0–15.0)
MCH: 29.6 pg (ref 26.0–34.0)
MCHC: 34.3 g/dL (ref 30.0–36.0)
MCV: 86.2 fL (ref 80.0–100.0)
Platelets: 202 10*3/uL (ref 150–400)
RBC: 4.19 MIL/uL (ref 3.87–5.11)
RDW: 13.1 % (ref 11.5–15.5)
WBC: 7.5 10*3/uL (ref 4.0–10.5)
nRBC: 0 % (ref 0.0–0.2)

## 2024-03-24 LAB — GLUCOSE, CAPILLARY
Glucose-Capillary: 166 mg/dL — ABNORMAL HIGH (ref 70–99)
Glucose-Capillary: 164 mg/dL — ABNORMAL HIGH (ref 70–99)
Glucose-Capillary: 173 mg/dL — ABNORMAL HIGH (ref 70–99)
Glucose-Capillary: 139 mg/dL — ABNORMAL HIGH (ref 70–99)

## 2024-03-24 LAB — BASIC METABOLIC PANEL
Anion gap: 9 (ref 5–15)
BUN: 47 mg/dL — ABNORMAL HIGH (ref 8–23)
CO2: 24 mmol/L (ref 22–32)
Calcium: 9.7 mg/dL (ref 8.9–10.3)
Chloride: 103 mmol/L (ref 98–111)
Creatinine, Ser: 1.28 mg/dL — ABNORMAL HIGH (ref 0.44–1.00)
GFR, Estimated: 42 mL/min — ABNORMAL LOW (ref >60)
Glucose, Bld: 142 mg/dL — ABNORMAL HIGH (ref 70–99)
Potassium: 4.5 mmol/L (ref 3.5–5.1)
Sodium: 136 mmol/L (ref 135–145)

## 2024-03-24 MED ORDER — ALPRAZOLAM 0.25 MG PO TABS
0.2500 mg | ORAL_TABLET | Freq: Once | ORAL | Status: AC
  Administered 2024-03-24: 0.25 mg via ORAL
  Filled 2024-03-24: qty 1

## 2024-03-24 NOTE — Plan of Care (Signed)
  Problem: Education: Goal: Knowledge of General Education information will improve Description: Including pain rating scale, medication(s)/side effects and non-pharmacologic comfort measures Outcome: Progressing   Problem: Health Behavior/Discharge Planning: Goal: Ability to manage health-related needs will improve Outcome: Progressing   Problem: Clinical Measurements: Goal: Ability to maintain clinical measurements within normal limits will improve Outcome: Progressing Goal: Will remain free from infection Outcome: Progressing Goal: Diagnostic test results will improve Outcome: Progressing Goal: Respiratory complications will improve Outcome: Progressing Goal: Cardiovascular complication will be avoided Outcome: Progressing   Problem: Activity: Goal: Risk for activity intolerance will decrease Outcome: Progressing   Problem: Nutrition: Goal: Adequate nutrition will be maintained Outcome: Progressing   Problem: Coping: Goal: Level of anxiety will decrease Outcome: Progressing   Problem: Elimination: Goal: Will not experience complications related to bowel motility Outcome: Progressing Goal: Will not experience complications related to urinary retention Outcome: Progressing   Problem: Pain Managment: Goal: General experience of comfort will improve and/or be controlled Outcome: Progressing   Problem: Safety: Goal: Ability to remain free from injury will improve Outcome: Progressing   Problem: Skin Integrity: Goal: Risk for impaired skin integrity will decrease Outcome: Progressing   Problem: Education: Goal: Ability to describe self-care measures that may prevent or decrease complications (Diabetes Survival Skills Education) will improve Outcome: Progressing Goal: Individualized Educational Video(s) Outcome: Progressing   Problem: Coping: Goal: Ability to adjust to condition or change in health will improve Outcome: Progressing   Problem: Fluid  Volume: Goal: Ability to maintain a balanced intake and output will improve Outcome: Progressing   Problem: Health Behavior/Discharge Planning: Goal: Ability to identify and utilize available resources and services will improve Outcome: Progressing Goal: Ability to manage health-related needs will improve Outcome: Progressing   Problem: Metabolic: Goal: Ability to maintain appropriate glucose levels will improve Outcome: Progressing   Problem: Nutritional: Goal: Maintenance of adequate nutrition will improve Outcome: Progressing Goal: Progress toward achieving an optimal weight will improve Outcome: Progressing   Problem: Skin Integrity: Goal: Risk for impaired skin integrity will decrease Outcome: Progressing   Problem: Tissue Perfusion: Goal: Adequacy of tissue perfusion will improve Outcome: Progressing   Problem: Education: Goal: Verbalization of understanding the information provided (i.e., activity precautions, restrictions, etc) will improve Outcome: Progressing Goal: Individualized Educational Video(s) Outcome: Progressing   Problem: Activity: Goal: Ability to ambulate and perform ADLs will improve Outcome: Progressing   Problem: Clinical Measurements: Goal: Postoperative complications will be avoided or minimized Outcome: Progressing   Problem: Self-Concept: Goal: Ability to maintain and perform role responsibilities to the fullest extent possible will improve Outcome: Progressing   Problem: Pain Management: Goal: Pain level will decrease Outcome: Progressing

## 2024-03-24 NOTE — Progress Notes (Signed)
   Subjective: 5 Days Post-Op Procedure(s) (LRB): PINNING, EXTREMITY, PERCUTANEOUS (Right) Patient reports pain as mild.   Patient is well, and has had no acute complaints or problems Denies any CP, SOB, ABD pain. We will continue therapy today.  Slow progress with PT , discussing SNF  Objective: Vital signs in last 24 hours: Temp:  [97.4 F (36.3 C)-98.3 F (36.8 C)] 97.4 F (36.3 C) (03/25 0931) Pulse Rate:  [68-76] 76 (03/25 0931) Resp:  [16-18] 18 (03/25 0931) BP: (117-147)/(47-79) 147/47 (03/25 0931) SpO2:  [97 %] 97 % (03/25 0931)  Intake/Output from previous day: 03/24 0701 - 03/25 0700 In: 480 [P.O.:480] Out: 1100 [Urine:1100] Intake/Output this shift: No intake/output data recorded.  Recent Labs    03/22/24 0154 03/23/24 0602 03/24/24 0514  HGB 11.7* 12.3 12.4   Recent Labs    03/23/24 0602 03/24/24 0514  WBC 8.0 7.5  RBC 4.13 4.19  HCT 35.8* 36.1  PLT 194 202   Recent Labs    03/23/24 0602 03/24/24 0514  NA 137 136  K 4.5 4.5  CL 106 103  CO2 25 24  BUN 41* 47*  CREATININE 1.38* 1.28*  GLUCOSE 131* 142*  CALCIUM 9.6 9.7   No results for input(s): "LABPT", "INR" in the last 72 hours.  EXAM General - Patient is Alert, Appropriate, and Oriented Extremity - Neurovascular intact Sensation intact distally Intact pulses distally Dorsiflexion/Plantar flexion intact No cellulitis present Compartment soft Dressing - dressing C/D/I and no drainage Motor Function - intact, moving foot and toes well on exam.   Past Medical History:  Diagnosis Date   Anxiety    Cervicalgia    Diverticulosis    Generalized OA    Osteoarthritis bilateral knees   GERD (gastroesophageal reflux disease)    Headache    Hyperlipidemia    Hypertension    Occasional tremors     Assessment/Plan:   5 Days Post-Op Procedure(s) (LRB): PINNING, EXTREMITY, PERCUTANEOUS (Right) Principal Problem:   Closed right hip fracture, initial encounter (HCC) Active  Problems:   Uncontrolled hypertension   Hypothyroidism   Anxiety and depression   Dyslipidemia   Type 2 diabetes mellitus without complications (HCC)   Subcapital fracture of hip, right, closed, initial encounter (HCC)  Estimated body mass index is 31.41 kg/m as calculated from the following:   Height as of this encounter: 5\' 4"  (1.626 m).   Weight as of this encounter: 83 kg. Advance diet Up with therapy Pain well-controlled Labs and vital signs are stable Care management to assist with discharge to SNF, slow progress with PT Ortho to sign off at this time   Follow-up with [facility] orthopedics in 2 weeks for wound check and x-rays Lovenox 40 mg subcu daily x 14 days at discharge Patient can remove honeycomb dressing 03/26/2024  and shower with no dressing over the incision site.   DVT Prophylaxis - Lovenox, TED hose, and SCDs Weight-Bearing as tolerated to right leg   T. Cranston Neighbor, PA-C [facility] 03/24/2024, 10:48 AM

## 2024-03-24 NOTE — Progress Notes (Signed)
 Physical Therapy Treatment Patient Details Name: Erika Nelson MRN: [id number] DOB: 1941-03-24 Today's Date: 03/24/2024   History of Present Illness Pt is an 83 y.o. pleasant female with medical history significant for anxiety, mild dementia, diverticulosis, GERD, osteoarthritis, hypertension and dyslipidemia, who presented to the emergency room with acute onset of right hip pain after having a mechanical fall. Pt diagnosed with right hip impacted femoral neck fracture and is s/p closed reduction percutaneous pinning.    PT Comments  Pt in bed.  Stating she feels poorly today and "shaky"  stated it happens sometimes at home when she gets tired.  She continues with L knee pain today.  Reports sharp pain "inside the knee".  She does have increased pain with supine ROM and does not want to try standing on it this AM.  Pt asking for imaging this AM.  Discussed with MD.  Will monitor and continue as appropriate.   If plan is discharge home, recommend the following: Supervision due to cognitive status;A little help with bathing/dressing/bathroom;Assistance with cooking/housework;Help with stairs or ramp for entrance;Assist for transportation;Two people to help with walking and/or transfers   Can travel by private vehicle        Equipment Recommendations  Rolling walker (2 wheels);BSC/3in1    Recommendations for Other Services       Precautions / Restrictions Precautions Precautions: Fall Restrictions Weight Bearing Restrictions Per Provider Order: Yes RLE Weight Bearing Per Provider Order: Weight bearing as tolerated     Mobility  Bed Mobility                    Transfers                        Ambulation/Gait                   Stairs             Wheelchair Mobility     Tilt Bed    Modified Rankin (Stroke Patients Only)       Balance                                            Communication    Cognition Arousal:  Alert Behavior During Therapy: WFL for tasks assessed/performed   PT - Cognitive impairments: No apparent impairments                                Cueing    Exercises Other Exercises Other Exercises: gentle ROM L knee    General Comments        Pertinent Vitals/Pain Pain Assessment Pain Assessment: Faces Faces Pain Scale: Hurts whole lot Pain Location: L knee with ROM Pain Descriptors / Indicators: Sharp, Aching Pain Intervention(s): Repositioned    Home Living                          Prior Function            PT Goals (current goals can now be found in the care plan section) Progress towards PT goals: Not progressing toward goals - comment    Frequency    BID      PT Plan      Co-evaluation  AM-PAC PT "6 Clicks" Mobility   Outcome Measure  Help needed turning from your back to your side while in a flat bed without using bedrails?: A Little Help needed moving from lying on your back to sitting on the side of a flat bed without using bedrails?: A Little Help needed moving to and from a bed to a chair (including a wheelchair)?: A Lot Help needed standing up from a chair using your arms (e.g., wheelchair or bedside chair)?: A Lot Help needed to walk in hospital room?: A Lot Help needed climbing 3-5 steps with a railing? : Total 6 Click Score: 13    End of Session Equipment Utilized During Treatment: Gait belt Activity Tolerance: Patient limited by pain Patient left: in bed;with bed alarm set;with call bell/phone within reach Nurse Communication: Mobility status;Weight bearing status PT Visit Diagnosis: History of falling (Z91.81);Other abnormalities of gait and mobility (R26.89);Muscle weakness (generalized) (M62.81);Pain Pain - Right/Left: Right Pain - part of body: Hip     Time: 0953-1001 PT Time Calculation (min) (ACUTE ONLY): 8 min  Charges:    $Therapeutic Exercise: 8-22 mins PT General Charges $$  ACUTE PT VISIT: 1 Visit                   Danielle Dess, PTA 03/24/24, 10:17 AM

## 2024-03-24 NOTE — Progress Notes (Addendum)
 PROGRESS NOTE   HPI was taken from Dr. Arville Care: Erika Nelson is a 83 y.o. pleasant Caucasian female with medical history significant for anxiety, diverticulosis, GERD, osteoarthritis, hypertension and dyslipidemia, who presented to the emergency room with acute onset of right hip pain after having a mechanical fall.  The patient was coming out of her ophthalmology appointment walking with a cane and apparently lost balance falling on her right side.  She denied any presyncope or syncope.  No paresthesias or focal muscle weakness.  No chest pain or palpitations.  No tinnitus or vertigo.  No dysuria, oliguria or hematuria or flank pain.  No cough or wheezing or hemoptysis.  No bleeding diathesis.  No chest pain or palpitations.   ED Course: When the patient came to the ER, blood glucose was 164, BUN 30 and creatinine 1.24.  CBC was within normal. EKG as reviewed by me : EKG showed normal sinus rhythm with a rate of 78 with PACs. Imaging: Noncontrast right hip CT scan revealed mildly impacted subcapital femoral neck fracture.  Portable chest x-ray showed no acute cardiopulmonary disease.   The patient was given 150 mg of p.o. Diflucan for candidal eruption as well as Mycolog ointment.  She will be admitted to a medical-surgical bed for further evaluation and management.   MAXX CALAWAY  ZOX:[id number] DOB: Aug 16, 1941 DOA: 03/18/2024 PCP: Danella Penton, MD   Assessment & Plan:   Principal Problem:   Closed right hip fracture, initial encounter [facility]) Active Problems:   Uncontrolled hypertension   Hypothyroidism   Anxiety and depression   Dyslipidemia   Type 2 diabetes mellitus without complications (HCC)   Subcapital fracture of hip, right, closed, initial encounter (HCC)  Assessment and Plan: Closed right hip fracture: s/p right hip closed reduction percutaneous pinning 03/19/24 as per ortho surg. Norco, morphine prn for pain. Difficulty working w/ PT. Initially therapy was recommending HH but  now SNF.  Left knee pain: XR ordered. Previously pt was told she needed a left knee replacement. Norco, morphine prn for pain   HTN: continue on home dose of irbesartan    DM2: well controlled, HbA1c 6.0. Continue on SSI w/ accuchecks   HLD: continue on statin    Depression: severity unknown. Continue on home dose of paroxetine   Anxiety: severity unknown. Continue on home dose of xanax    Hypothyroidism: continue on home dose of levothyroxine   Obesity: BMI 31.4. Would benefit from weight loss    DVT prophylaxis: SCDs Code Status: full  Family Communication: discussed pt's care w/ pt's son at bedside and answered his questions Disposition Plan: d/c to SNF  Level of care: Med-Surg   Status is: Inpatient Remains inpatient appropriate because: medically stable. PT now recs SNF. Waiting on SNF placement   Consultants:  Ortho surg   Procedures:   Antimicrobials:    Subjective: Pt c/o left knee pain   Objective: Vitals:   03/23/24 1100 03/23/24 1718 03/23/24 2030 03/24/24 0542  BP: 121/61 117/67 128/77 (!) 144/79  Pulse:  68 75 68  Resp:  16 18 18   Temp:  98.3 F (36.8 C) 98.3 F (36.8 C) 98.3 F (36.8 C)  TempSrc:      SpO2:  97% 97% 97%  Weight:      Height:        Intake/Output Summary (Last 24 hours) at 03/24/2024 0857 Last data filed at 03/24/2024 0545 Gross per 24 hour  Intake 480 ml  Output 1100 ml  Net -  620 ml   Filed Weights   03/18/24 1711 03/18/24 1715  Weight: 83 kg 83 kg    Examination:  General exam: Appears anxious  Respiratory system: clear breath sounds b/l  Cardiovascular system: S1/S2+. No rubs or gallops  Gastrointestinal system: abd is soft, NT, obese & hypoactive bowel sounds  Central nervous system: alert & oriented. Moves all extremities  Psychiatry: judgement and insight appears at baseline. Anxious mood and affect     Data Reviewed: I have personally reviewed following labs and imaging studies  CBC: Recent Labs   Lab 03/20/24 0408 03/21/24 0553 03/22/24 0154 03/23/24 0602 03/24/24 0514  WBC 11.2* 9.2 8.9 8.0 7.5  HGB 12.6 12.3 11.7* 12.3 12.4  HCT 35.7* 35.9* 35.1* 35.8* 36.1  MCV 84.4 87.6 89.1 86.7 86.2  PLT 204 187 180 194 202   Basic Metabolic Panel: Recent Labs  Lab 03/20/24 0408 03/21/24 0553 03/22/24 0154 03/23/24 0602 03/24/24 0514  NA 137 137 135 137 136  K 4.4 4.0 4.5 4.5 4.5  CL 105 107 106 106 103  CO2 22 21* 24 25 24   GLUCOSE 182* 198* 129* 131* 142*  BUN 30* 42* 40* 41* 47*  CREATININE 1.39* 1.40* 1.26* 1.38* 1.28*  CALCIUM 9.1 9.0 8.7* 9.6 9.7   GFR: Estimated Creatinine Clearance: 35.9 mL/min (A) (by C-G formula based on SCr of 1.28 mg/dL (H)). Liver Function Tests: No results for input(s): "AST", "ALT", "ALKPHOS", "BILITOT", "PROT", "ALBUMIN" in the last 168 hours. No results for input(s): "LIPASE", "AMYLASE" in the last 168 hours. No results for input(s): "AMMONIA" in the last 168 hours. Coagulation Profile: No results for input(s): "INR", "PROTIME" in the last 168 hours. Cardiac Enzymes: No results for input(s): "CKTOTAL", "CKMB", "CKMBINDEX", "TROPONINI" in the last 168 hours. BNP (last 3 results) No results for input(s): "PROBNP" in the last 8760 hours. HbA1C: No results for input(s): "HGBA1C" in the last 72 hours.  CBG: Recent Labs  Lab 03/23/24 0742 03/23/24 1154 03/23/24 1656 03/23/24 2157 03/24/24 0853  GLUCAP 142* 268* 113* 167* 166*   Lipid Profile: No results for input(s): "CHOL", "HDL", "LDLCALC", "TRIG", "CHOLHDL", "LDLDIRECT" in the last 72 hours. Thyroid Function Tests: No results for input(s): "TSH", "T4TOTAL", "FREET4", "T3FREE", "THYROIDAB" in the last 72 hours. Anemia Panel: No results for input(s): "VITAMINB12", "FOLATE", "FERRITIN", "TIBC", "IRON", "RETICCTPCT" in the last 72 hours. Sepsis Labs: No results for input(s): "PROCALCITON", "LATICACIDVEN" in the last 168 hours.  Recent Results (from the past 240 hours)  MRSA  Next Gen by PCR, Nasal     Status: None   Collection Time: 03/19/24  6:47 AM   Specimen: Nasal Mucosa; Nasal Swab  Result Value Ref Range Status   MRSA by PCR Next Gen NOT DETECTED NOT DETECTED Final    Comment: (NOTE) The GeneXpert MRSA Assay (FDA approved for NASAL specimens only), is one component of a comprehensive MRSA colonization surveillance program. It is not intended to diagnose MRSA infection nor to guide or monitor treatment for MRSA infections. Test performance is not FDA approved in patients less than 1 years old. Performed at [facility], [address] [place], [place] [address]          Radiology Studies: No results found.       Scheduled Meds:  docusate sodium  100 mg Oral BID   enoxaparin (LOVENOX) injection  40 mg Subcutaneous Q24H   fluticasone furoate-vilanterol  1 puff Inhalation Daily   insulin aspart  0-5 Units Subcutaneous QHS  insulin aspart  0-9 Units Subcutaneous TID WC   irbesartan  37.5 mg Oral Daily   levothyroxine  75 mcg Oral QAC breakfast   multivitamin with minerals  1 tablet Oral Q2200   pantoprazole  40 mg Oral Daily   PARoxetine  20 mg Oral Daily   simvastatin  20 mg Oral QHS   Continuous Infusions:     LOS: 5 days       Charise Killian, MD [facility] Pager [phone]  If 7PM-7AM, please contact night-coverage [email or web address] 03/24/2024, 8:57 AM

## 2024-03-24 NOTE — Plan of Care (Signed)
   Problem: Education: Goal: Knowledge of General Education information will improve Description Including pain rating scale, medication(s)/side effects and non-pharmacologic comfort measures Outcome: Progressing   Problem: Clinical Measurements: Goal: Will remain free from infection Outcome: Progressing   Problem: Nutrition: Goal: Adequate nutrition will be maintained Outcome: Progressing   Problem: Coping: Goal: Level of anxiety will decrease Outcome: Progressing

## 2024-03-24 NOTE — NC FL2 (Signed)
 [place] MEDICAID FL2 LEVEL OF CARE FORM     IDENTIFICATION  Patient Name: Erika Nelson Birthdate: 12/13/1941 Sex: female Admission Date (Current Location): 03/18/2024  [facility] and [place] Number:  Chiropodist and Address:  [facility], [address], [place], [place] [address]      Provider Number: [id number]  Attending Physician Name and Address:  Charise Killian, MD  Relative Name and Phone Number:  Osa Craver  Daughter  Emergency Contact  [phone]    Current Level of Care: Hospital Recommended Level of Care: Skilled Nursing Facility Prior Approval Number:    Date Approved/Denied:   PASRR Number: [id number]  Discharge Plan: SNF    Current Diagnoses: Patient Active Problem List   Diagnosis Date Noted   Closed right hip fracture, initial encounter (HCC) 03/19/2024   Uncontrolled hypertension 03/19/2024   Hypothyroidism 03/19/2024   Anxiety and depression 03/19/2024   Dyslipidemia 03/19/2024   Type 2 diabetes mellitus without complications (HCC) 03/19/2024   Subcapital fracture of hip, right, closed, initial encounter (HCC) 03/19/2024   Chronic diarrhea 12/07/2023   Chronic atrial fibrillation with RVR (HCC) 12/07/2023   Pneumonia 12/07/2023   Metabolic acidosis 12/06/2023   Fall at home, initial encounter 12/06/2023   Sepsis due to pneumonia (HCC) 12/06/2023   Dyspnea 07/13/2020   Shortness of breath 07/12/2020   Essential hypertension    Type 2 diabetes mellitus with hyperlipidemia (HCC)    Chronic low back pain    Stage 3b chronic kidney disease (HCC)    Benign essential hypertension 10/09/2019   DM type 2 with diabetic mixed hyperlipidemia (HCC) 11/10/2018   Osteoarthrosis, localized, primary, knee 07/26/2016   Acquired hypothyroidism 04/21/2015    Orientation RESPIRATION BLADDER Height & Weight     Self, Time, Situation, Place  Normal Incontinent, External catheter Weight: 83 kg Height:  5\' 4"   (162.6 cm)  BEHAVIORAL SYMPTOMS/MOOD NEUROLOGICAL BOWEL NUTRITION STATUS      Continent Diet (regular diet)  AMBULATORY STATUS COMMUNICATION OF NEEDS Skin   Extensive Assist Verbally Normal, Surgical wounds                       Personal Care Assistance Level of Assistance  Bathing, Feeding, Dressing Bathing Assistance: Limited assistance Feeding assistance: Limited assistance Dressing Assistance: Maximum assistance     Functional Limitations Info  Sight, Speech, Hearing Sight Info: Adequate Hearing Info: Adequate Speech Info: Adequate    SPECIAL CARE FACTORS FREQUENCY  PT (By licensed PT), OT (By licensed OT)     PT Frequency: 5 times per week OT Frequency: 5 times per week            Contractures Contractures Info: Not present    Additional Factors Info  Code Status, Allergies Code Status Info: Full code Allergies Info: Albuterol, Norco (Hydrocodone-acetaminophen), Prozac (Fluoxetine Hcl), Sulfa Antibiotics, Tramadol           Current Medications (03/24/2024):  This is the current hospital active medication list Current Facility-Administered Medications  Medication Dose Route Frequency Provider Last Rate Last Admin   ALPRAZolam Prudy Feeler) tablet 0.25 mg  0.25 mg Oral QHS PRN Mansy, Jan A, MD   0.25 mg at 03/22/24 2127   bismuth subsalicylate (PEPTO BISMOL) 262 MG/15ML suspension 30 mL  30 mL Oral Q6H PRN Mansy, Jan A, MD       docusate sodium (COLACE) capsule 100 mg  100 mg Oral BID Reinaldo Berber, MD   100 mg at 03/24/24  0841   enoxaparin (LOVENOX) injection 40 mg  40 mg Subcutaneous Q24H Reinaldo Berber, MD   40 mg at 03/24/24 0842   fluticasone furoate-vilanterol (BREO ELLIPTA) 100-25 MCG/ACT 1 puff  1 puff Inhalation Daily Mansy, Jan A, MD   1 puff at 03/24/24 0843   HYDROcodone-acetaminophen (NORCO) 7.5-325 MG per tablet 1-2 tablet  1-2 tablet Oral Q6H PRN Charise Killian, MD   2 tablet at 03/24/24 4098   insulin aspart (novoLOG) injection 0-5 Units   0-5 Units Subcutaneous QHS Mansy, Jan A, MD   2 Units at 03/19/24 2152   insulin aspart (novoLOG) injection 0-9 Units  0-9 Units Subcutaneous TID WC Mansy, Vernetta Honey, MD   2 Units at 03/24/24 1191   irbesartan (AVAPRO) tablet 37.5 mg  37.5 mg Oral Daily Mansy, Jan A, MD   37.5 mg at 03/24/24 4782   levothyroxine (SYNTHROID) tablet 75 mcg  75 mcg Oral QAC breakfast Mansy, Jan A, MD   75 mcg at 03/24/24 0542   magnesium hydroxide (MILK OF MAGNESIA) suspension 30 mL  30 mL Oral Daily PRN Mansy, Jan A, MD       menthol-cetylpyridinium (CEPACOL) lozenge 3 mg  1 lozenge Oral PRN Reinaldo Berber, MD       Or   phenol (CHLORASEPTIC) mouth spray 1 spray  1 spray Mouth/Throat PRN Reinaldo Berber, MD       metoCLOPramide (REGLAN) tablet 5-10 mg  5-10 mg Oral Q8H PRN Reinaldo Berber, MD       Or   metoCLOPramide (REGLAN) injection 5-10 mg  5-10 mg Intravenous Q8H PRN Reinaldo Berber, MD       morphine (PF) 2 MG/ML injection 0.5 mg  0.5 mg Intravenous Q2H PRN Mansy, Jan A, MD   0.5 mg at 03/20/24 1601   multivitamin with minerals tablet 1 tablet  1 tablet Oral Q2200 Mansy, Jan A, MD   1 tablet at 03/23/24 2225   nystatin cream (MYCOSTATIN) 1 Application  1 Application Topical BID PRN Mansy, Jan A, MD       ondansetron [facility]) injection 4 mg  4 mg Intravenous Q4H PRN Mansy, Jan A, MD   4 mg at 03/19/24 1352   pantoprazole (PROTONIX) EC tablet 40 mg  40 mg Oral Daily Mansy, Jan A, MD   40 mg at 03/24/24 9562   PARoxetine (PAXIL) tablet 20 mg  20 mg Oral Daily Mansy, Jan A, MD   20 mg at 03/24/24 0841   simvastatin (ZOCOR) tablet 20 mg  20 mg Oral QHS Mansy, Jan A, MD   20 mg at 03/23/24 2225     Discharge Medications: Please see discharge summary for a list of discharge medications.  Relevant Imaging Results:  Relevant Lab Results:   Additional Information SS# [id number]  Marlowe Sax, RN

## 2024-03-24 NOTE — TOC Progression Note (Signed)
 Transition of Care [facility]) - Progression Note    Patient Details  Name: Erika Nelson MRN: [id number] Date of Birth: 01/25/1941  Transition of [facility]) CM/SW Contact  Marlowe Sax, RN Phone Number: 03/24/2024, 3:50 PM  Clinical Narrative:    Bedsearch done, Fl2 comp[leted, PASSR obtained will review bed offer once obtained   Expected Discharge Plan: Home w Home Health Services Barriers to Discharge: Barriers Resolved  Expected Discharge Plan and Services   Discharge Planning Services: CM Consult   Living arrangements for the past 2 months: Single Family Home Expected Discharge Date: 03/20/24               DME Arranged: 3-N-1 DME Agency: [employer] Date DME Agency Contacted: 03/22/24 Time DME Agency Contacted: 1110 Representative spoke with at DME Agency: [place] HH Arranged: PT, OT HH Agency: [facility] Date [facility] Agency Contacted: 03/20/24 Time HH Agency Contacted: 1551 Representative spoke with at [facility] Agency: [place]   Social Determinants of Health (SDOH) Interventions SDOH Screenings   Food Insecurity: No Food Insecurity (03/19/2024)  Housing: Low Risk  (03/19/2024)  Transportation Needs: No Transportation Needs (03/19/2024)  Utilities: Not At Risk (03/19/2024)  Social Connections: Socially Isolated (03/19/2024)  Tobacco Use: Low Risk  (03/19/2024)  Recent Concern: Tobacco Use - Medium Risk (03/18/2024)   Received from [facility]    Readmission Risk Interventions     No data to display

## 2024-03-25 DIAGNOSIS — E669 Obesity, unspecified: Secondary | ICD-10-CM | POA: Diagnosis not present

## 2024-03-25 DIAGNOSIS — G8929 Other chronic pain: Secondary | ICD-10-CM

## 2024-03-25 DIAGNOSIS — S72001D Fracture of unspecified part of neck of right femur, subsequent encounter for closed fracture with routine healing: Secondary | ICD-10-CM

## 2024-03-25 DIAGNOSIS — M25562 Pain in left knee: Secondary | ICD-10-CM

## 2024-03-25 DIAGNOSIS — S72001S Fracture of unspecified part of neck of right femur, sequela: Secondary | ICD-10-CM | POA: Diagnosis not present

## 2024-03-25 DIAGNOSIS — E039 Hypothyroidism, unspecified: Secondary | ICD-10-CM

## 2024-03-25 DIAGNOSIS — I10 Essential (primary) hypertension: Secondary | ICD-10-CM | POA: Diagnosis not present

## 2024-03-25 LAB — BASIC METABOLIC PANEL
Anion gap: 7 (ref 5–15)
BUN: 50 mg/dL — ABNORMAL HIGH (ref 8–23)
CO2: 24 mmol/L (ref 22–32)
Calcium: 9.7 mg/dL (ref 8.9–10.3)
Chloride: 104 mmol/L (ref 98–111)
Creatinine, Ser: 1.41 mg/dL — ABNORMAL HIGH (ref 0.44–1.00)
GFR, Estimated: 37 mL/min — ABNORMAL LOW (ref >60)
Glucose, Bld: 143 mg/dL — ABNORMAL HIGH (ref 70–99)
Potassium: 4.9 mmol/L (ref 3.5–5.1)
Sodium: 135 mmol/L (ref 135–145)

## 2024-03-25 LAB — GLUCOSE, CAPILLARY
Glucose-Capillary: 140 mg/dL — ABNORMAL HIGH (ref 70–99)
Glucose-Capillary: 230 mg/dL — ABNORMAL HIGH (ref 70–99)
Glucose-Capillary: 186 mg/dL — ABNORMAL HIGH (ref 70–99)
Glucose-Capillary: 194 mg/dL — ABNORMAL HIGH (ref 70–99)

## 2024-03-25 MED ORDER — HYDROCODONE-ACETAMINOPHEN 7.5-325 MG PO TABS
1.0000 | ORAL_TABLET | Freq: Four times a day (QID) | ORAL | Status: DC | PRN
  Administered 2024-03-26: 1 via ORAL
  Filled 2024-03-25: qty 1

## 2024-03-25 NOTE — Progress Notes (Signed)
 Physical Therapy Treatment Patient Details Name: Erika Nelson MRN: [id number] DOB: December 23, 1941 Today's Date: 03/25/2024   History of Present Illness 83 y.o. pleasant female with medical history significant for anxiety, mild dementia, diverticulosis, GERD, osteoarthritis, hypertension and dyslipidemia, who presented to the emergency room with acute onset of right hip pain after having a mechanical fall. Pt diagnosed with right hip impacted femoral neck fracture and is s/p closed reduction percutaneous pinning.    PT Comments  Coordinated pain meds before session and, compared to AM session, she did exceedingly better with much increased function with decreased pain limited activity.  She was able to ambulate ~78ft with slow but safe and relatively consistent cadence.  Further discussed entry to home, deferred attempting stairs secondary rail placement but discussed extensively how to manage stairs with w/c and 2 people.  Son voices understanding and confidence.  Pt will benefit from ongoing PT, continue with POC.     If plan is discharge home, recommend the following: Supervision due to cognitive status;A little help with bathing/dressing/bathroom;Assistance with cooking/housework;Help with stairs or ramp for entrance;Assist for transportation;Two people to help with walking and/or transfers   Can travel by private vehicle     No  Equipment Recommendations  Rolling walker (2 wheels);BSC/3in1    Recommendations for Other Services       Precautions / Restrictions Precautions Precautions: Fall Restrictions RLE Weight Bearing Per Provider Order: Weight bearing as tolerated     Mobility  Bed Mobility Overal bed mobility: Needs Assistance Bed Mobility: Sit to Supine, Supine to Sit     Supine to sit: Contact guard Sit to supine: Contact guard assist   General bed mobility comments: Pt was slow and effortful, but with time and extra encouragement she got to EOB and back to supine post  ambulation w/ only CGA guidance    Transfers Overall transfer level: Needs assistance Equipment used: Rolling walker (2 wheels) Transfers: Sit to/from Stand Sit to Stand: Min assist, Mod assist           General transfer comment: Pt needing a lot of cuing for set up and encouargement to blieve she could get her self up.  Mutiple set up strategies cued and trialed, needed min/mod A to rise from bed, minA to rise from recliner    Ambulation/Gait Ambulation/Gait assistance: Contact guard assist Gait Distance (Feet): 65 Feet Assistive device: Rolling walker (2 wheels)         General Gait Details: Pt did much better this afternoon with ambulation tolerance.  She reported some L knee pain but ultimately was able to use UEs/RW to support herself, show greatly improved confidence and nearing symmetical gait.  Pt did fatigue, but pain was not the limiter this afternoon.   Stairs             Wheelchair Mobility     Tilt Bed    Modified Rankin (Stroke Patients Only)       Balance Overall balance assessment: Needs assistance, History of Falls Sitting-balance support: Feet supported Sitting balance-Leahy Scale: Good     Standing balance support: Bilateral upper extremity supported, During functional activity Standing balance-Leahy Scale: Fair                              Hotel manager: No apparent difficulties  Cognition Arousal: Alert Behavior During Therapy: Anxious   PT - Cognitive impairments: No apparent impairments  Following commands: Intact      Cueing Cueing Techniques: Verbal cues, Visual cues, Tactile cues  Exercises General Exercises - Lower Extremity Ankle Circles/Pumps: AROM, 10 reps Long Arc Quad: AROM, 10 reps, Right (too painful on L) Hip ABduction/ADduction: Strengthening, 10 reps, Both    General Comments General comments (skin integrity, edema, etc.):  pre-medicated      Pertinent Vitals/Pain Pain Assessment Pain Assessment: 0-10 Pain Score: 3  Pain Location: L knee    Home Living                          Prior Function            PT Goals (current goals can now be found in the care plan section) Progress towards PT goals: Progressing toward goals    Frequency    BID      PT Plan      Co-evaluation              AM-PAC PT "6 Clicks" Mobility   Outcome Measure  Help needed turning from your back to your side while in a flat bed without using bedrails?: A Little Help needed moving from lying on your back to sitting on the side of a flat bed without using bedrails?: A Little Help needed moving to and from a bed to a chair (including a wheelchair)?: A Little Help needed standing up from a chair using your arms (e.g., wheelchair or bedside chair)?: A Little Help needed to walk in hospital room?: A Lot Help needed climbing 3-5 steps with a railing? : Total 6 Click Score: 15    End of Session Equipment Utilized During Treatment: Gait belt Activity Tolerance: Patient limited by pain Patient left: with call bell/phone within reach;with bed alarm set Nurse Communication: Mobility status PT Visit Diagnosis: History of falling (Z91.81);Other abnormalities of gait and mobility (R26.89);Muscle weakness (generalized) (M62.81);Pain Pain - Right/Left: Right Pain - part of body: Hip     Time: 1715-1740 PT Time Calculation (min) (ACUTE ONLY): 25 min  Charges:    $Gait Training: 8-22 mins $Therapeutic Activity: 8-22 mins PT General Charges $$ ACUTE PT VISIT: 1 Visit                     Malachi Pro, DPT 03/25/2024, 6:16 PM

## 2024-03-25 NOTE — Hospital Course (Signed)
 83 y.o. pleasant Caucasian female with medical history significant for anxiety, diverticulosis, GERD, osteoarthritis, hypertension and dyslipidemia, who presented to the emergency room with acute onset of right hip pain after having a mechanical fall.  The patient was coming out of her ophthalmology appointment walking with a cane and apparently lost balance falling on her right side.  She denied any presyncope or syncope.  No paresthesias or focal muscle weakness.  No chest pain or palpitations.  No tinnitus or vertigo.  No dysuria, oliguria or hematuria or flank pain.  No cough or wheezing or hemoptysis.  No bleeding diathesis.  No chest pain or palpitations.   ED Course: When the patient came to the ER, blood glucose was 164, BUN 30 and creatinine 1.24.  CBC was within normal. EKG as reviewed by me : EKG showed normal sinus rhythm with a rate of 78 with PACs. Imaging: Noncontrast right hip CT scan revealed mildly impacted subcapital femoral neck fracture.  Portable chest x-ray showed no acute cardiopulmonary disease.   The patient was given 150 mg of p.o. Diflucan for candidal eruption as well as Mycolog ointment.  She will be admitted to a medical-surgical bed for further evaluation and management.  3/20.  Right hip closed reduction percutaneous pinning by Dr. Audelia Acton on 3/20. 3/26.  Patient walked 6 feet with physical therapy.  Family thinking about taking her home with home health rather than rehab. 3/27.  Discharged home with home health.  Change DVT prophylaxis over to aspirin rather than Lovenox injections.  Walked 50 feet with physical therapy.

## 2024-03-25 NOTE — Assessment & Plan Note (Signed)
 Class I with a BMI of 31.41.

## 2024-03-25 NOTE — TOC Progression Note (Signed)
 Transition of Care [facility]) - Progression Note    Patient Details  Name: Erika Nelson MRN: [id number] Date of Birth: [birth year]  Transition of Care [facility]) CM/SW Contact  Marlowe Sax, RN Phone Number: 03/25/2024, 1:32 PM  Clinical Narrative:    Met with the patient, her son will be here at 2 and will review the bed offers   Expected Discharge Plan: Home w Home Health Services Barriers to Discharge: Barriers Resolved  Expected Discharge Plan and Services   Discharge Planning Services: CM Consult   Living arrangements for the past 2 months: Single Family Home Expected Discharge Date: 03/20/24               DME Arranged: 3-N-1 DME Agency: [facility] Date DME Agency Contacted: 03/22/24 Time DME Agency Contacted: 1110 Representative spoke with at DME Agency: [facility] HH Arranged: PT, OT HH Agency: [facility] Date [facility] Agency Contacted: 03/20/24 Time HH Agency Contacted: 1551 Representative spoke with at [facility] Agency: [place]   Social Determinants of Health (SDOH) Interventions SDOH Screenings   Food Insecurity: No Food Insecurity (03/19/2024)  Housing: Low Risk  (03/19/2024)  Transportation Needs: No Transportation Needs (03/19/2024)  Utilities: Not At Risk (03/19/2024)  Social Connections: Socially Isolated (03/19/2024)  Tobacco Use: Low Risk  (03/19/2024)  Recent Concern: Tobacco Use - Medium Risk (03/18/2024)   Received from [facility]    Readmission Risk Interventions     No data to display

## 2024-03-25 NOTE — TOC Progression Note (Signed)
 Transition of Care [facility]) - Progression Note    Patient Details  Name: Erika Nelson MRN: [id number] Date of Birth: 1941/05/09  Transition of Care [facility]) CM/SW Contact  Marlowe Sax, RN Phone Number: 03/25/2024, 4:11 PM  Clinical Narrative:     Spoke with the son Izzah Pasqua I provided him with the list of SNF rehab facilities and he stated that he would like to just take her home with her family members that will be with her 24/7, they would like to use [facility] for [facility] and they would like to get a wc, [facility] can accept her  She has the 3 in 1 from [employer], Wheelchair will be delivered to the bedside, Son to transport tomorrow after work around 2 Pm   Expected Discharge Plan: Home w Home Health Services Barriers to Discharge: Barriers Resolved  Expected Discharge Plan and Services   Discharge Planning Services: CM Consult   Living arrangements for the past 2 months: Single Family Home Expected Discharge Date: 03/20/24               DME Arranged: 3-N-1 DME Agency: [facility] Date DME Agency Contacted: 03/22/24 Time DME Agency Contacted: 1110 Representative spoke with at DME Agency: [facility] HH Arranged: PT, OT HH Agency: [facility] Date [facility] Agency Contacted: 03/20/24 Time HH Agency Contacted: 1551 Representative spoke with at [facility] Agency: [place]   Social Determinants of Health (SDOH) Interventions SDOH Screenings   Food Insecurity: No Food Insecurity (03/19/2024)  Housing: Low Risk  (03/19/2024)  Transportation Needs: No Transportation Needs (03/19/2024)  Utilities: Not At Risk (03/19/2024)  Social Connections: Socially Isolated (03/19/2024)  Tobacco Use: Low Risk  (03/19/2024)  Recent Concern: Tobacco Use - Medium Risk (03/18/2024)   Received from [facility]    Readmission Risk Interventions     No data to display

## 2024-03-25 NOTE — TOC Progression Note (Signed)
 Transition of Care [facility]) - Progression Note    Patient Details  Name: Erika Nelson MRN: [id number] Date of Birth: 1941/04/09  Transition of Care [facility]) CM/SW Contact  Marlowe Sax, RN Phone Number: 03/25/2024, 10:22 AM  Clinical Narrative:    Spoke with the patient and reviewed the bed offers, she stated that she would like to think about it for a little bit, She is getting ready to work with PT and would like me to get back with her about her choice, I explained in order to get Ins approval we have to know where she is going   Expected Discharge Plan: Home w Home Health Services Barriers to Discharge: Barriers Resolved  Expected Discharge Plan and Services   Discharge Planning Services: CM Consult   Living arrangements for the past 2 months: Single Family Home Expected Discharge Date: 03/20/24               DME Arranged: 3-N-1 DME Agency: [facility] Date DME Agency Contacted: 03/22/24 Time DME Agency Contacted: 1110 Representative spoke with at DME Agency: [facility] HH Arranged: PT, OT HH Agency: [facility] Date [facility] Agency Contacted: 03/20/24 Time HH Agency Contacted: 1551 Representative spoke with at [facility] Agency: [place]   Social Determinants of Health (SDOH) Interventions SDOH Screenings   Food Insecurity: No Food Insecurity (03/19/2024)  Housing: Low Risk  (03/19/2024)  Transportation Needs: No Transportation Needs (03/19/2024)  Utilities: Not At Risk (03/19/2024)  Social Connections: Socially Isolated (03/19/2024)  Tobacco Use: Low Risk  (03/19/2024)  Recent Concern: Tobacco Use - Medium Risk (03/18/2024)   Received from [facility]    Readmission Risk Interventions     No data to display

## 2024-03-25 NOTE — Progress Notes (Signed)
 Progress Note   Patient: Erika Nelson HYQ:[id number] DOB: 1941/09/03 DOA: 03/18/2024     6 DOS: the patient was seen and examined on 03/25/2024   Brief hospital course: 83 y.o. pleasant Caucasian female with medical history significant for anxiety, diverticulosis, GERD, osteoarthritis, hypertension and dyslipidemia, who presented to the emergency room with acute onset of right hip pain after having a mechanical fall.  The patient was coming out of her ophthalmology appointment walking with a cane and apparently lost balance falling on her right side.  She denied any presyncope or syncope.  No paresthesias or focal muscle weakness.  No chest pain or palpitations.  No tinnitus or vertigo.  No dysuria, oliguria or hematuria or flank pain.  No cough or wheezing or hemoptysis.  No bleeding diathesis.  No chest pain or palpitations.   ED Course: When the patient came to the ER, blood glucose was 164, BUN 30 and creatinine 1.24.  CBC was within normal. EKG as reviewed by me : EKG showed normal sinus rhythm with a rate of 78 with PACs. Imaging: Noncontrast right hip CT scan revealed mildly impacted subcapital femoral neck fracture.  Portable chest x-ray showed no acute cardiopulmonary disease.   The patient was given 150 mg of p.o. Diflucan for candidal eruption as well as Mycolog ointment.  She will be admitted to a medical-surgical bed for further evaluation and management.  3/20.  Right hip closed reduction percutaneous pinning by Dr. Audelia Acton on 3/20. 3/26.  Patient walked 6 feet with physical therapy.  Family thinking about taking her home with home health rather than rehab.  Assessment and Plan: * Closed hip fracture requiring operative repair, right, sequela Right hip closed reduction and pinning by Dr. Audelia Acton on 3/20.  Patient walk 6 feet with physical therapy.  Family thinking about bringing her home rather than going out to rehab.  Left knee pain Continue working with physical therapy.  X-ray  showing severe tricompartmental osteoarthritis with multiple intra articular loose bodies.  Patient and daughter are wanting to have a left knee replacement done at some point.  Essential hypertension Continue irbesartan  Obesity (BMI 30-39.9) Class I with a BMI of 31.41.  Type 2 diabetes mellitus without complications (HCC) Hemoglobin A1c 6.1.  Patient on sliding scale insulin.  Dyslipidemia Continue Zocor  Anxiety and depression Continue Xanax and Paxil.  Hypothyroidism Continue Synthroid        Subjective: Patient's major complaint was left knee pain in her getting around on her left knee.  Admitted with hip fracture.  Physical Exam: Vitals:   03/24/24 2130 03/25/24 0500 03/25/24 0819 03/25/24 1515  BP: (!) 124/53 114/65 (!) 146/82 102/86  Pulse: 73 62 64 77  Resp: 19 18 16 18   Temp: 98.2 F (36.8 C) 97.8 F (36.6 C) 97.8 F (36.6 C) 97.9 F (36.6 C)  TempSrc: Oral Oral Oral Oral  SpO2: 97% 96% 99% 99%  Weight:      Height:       Physical Exam HENT:     Head: Normocephalic.     Mouth/Throat:     Pharynx: No oropharyngeal exudate.  Eyes:     General: Lids are normal.     Conjunctiva/sclera: Conjunctivae normal.  Cardiovascular:     Rate and Rhythm: Normal rate and regular rhythm.     Heart sounds: Normal heart sounds, S1 normal and S2 normal.  Pulmonary:     Breath sounds: No decreased breath sounds, wheezing, rhonchi or rales.  Abdominal:  Palpations: Abdomen is soft.     Tenderness: There is no abdominal tenderness.  Musculoskeletal:     Left knee: Swelling present.     Right lower leg: No swelling.     Left lower leg: No swelling.  Skin:    General: Skin is warm.     Findings: No rash.  Neurological:     Mental Status: She is alert.     Data Reviewed: Creatinine 1.41, hemoglobin 12.4  Family Communication: Spoke with daughter on the phone  Disposition: Status is: Inpatient Remains inpatient appropriate because: Family is thinking  about taking her home with home health.  Only walk 6 feet today.  Planned Discharge Destination: Therapy recommending rehab but patient and family may want to go home with home health.    Time spent: 28 minutes  Author: Alford Highland, MD 03/25/2024 3:43 PM  For on call review [email or web address].

## 2024-03-25 NOTE — Progress Notes (Signed)
 Physical Therapy Treatment Patient Details Name: Erika Nelson MRN: [id number] DOB: [birth year] Today's Date: 03/25/2024   History of Present Illness Pt is an 83 y.o. pleasant female with medical history significant for anxiety, mild dementia, diverticulosis, GERD, osteoarthritis, hypertension and dyslipidemia, who presented to the emergency room with acute onset of right hip pain after having a mechanical fall. Pt diagnosed with right hip impacted femoral neck fracture and is s/p closed reduction percutaneous pinning.    PT Comments  Pt pleasant and motivated but ultimately remains pain limited (L knee more than R hip) and was therefore limited with tolerance for exercises and mobility/ambulation.  She displayed good effort but functionally is very limited.  Pt will benefit from ongoing PT, continue with POC.     If plan is discharge home, recommend the following: Supervision due to cognitive status;A little help with bathing/dressing/bathroom;Assistance with cooking/housework;Help with stairs or ramp for entrance;Assist for transportation;Two people to help with walking and/or transfers   Can travel by private vehicle     No  Equipment Recommendations  Rolling walker (2 wheels);BSC/3in1    Recommendations for Other Services       Precautions / Restrictions Precautions Precautions: Fall Restrictions Weight Bearing Restrictions Per Provider Order: Yes RLE Weight Bearing Per Provider Order: Weight bearing as tolerated     Mobility  Bed Mobility               General bed mobility comments: in chair before and after    Transfers Overall transfer level: Needs assistance Equipment used: Rolling walker (2 wheels) Transfers: Sit to/from Stand Sit to Stand: Mod assist           General transfer comment: difficulty getting to fully upright 2/2 c/o L knee pain, actually using surgical side as dominiant LE and needed direct assist to keep from sitting backdown on both standing  attempts    Ambulation/Gait Ambulation/Gait assistance: Mod assist, Min assist Gait Distance (Feet): 6 Feet Assistive device: Rolling walker (2 wheels)         General Gait Details: poor tolerance with L LE WBing, tending to keep knee partially flexed and clearly not putting all her weight on it.  Pt highly reliant on the walker and despite seemingly good motivation could not make herself do more than the few feet she managed.   Stairs             Wheelchair Mobility     Tilt Bed    Modified Rankin (Stroke Patients Only)       Balance Overall balance assessment: Needs assistance, History of Falls Sitting-balance support: Feet supported Sitting balance-Leahy Scale: Good     Standing balance support: Bilateral upper extremity supported, During functional activity Standing balance-Leahy Scale: Poor                              Communication Communication Communication: No apparent difficulties  Cognition Arousal: Alert Behavior During Therapy: Anxious   PT - Cognitive impairments: No apparent impairments                         Following commands: Intact      Cueing    Exercises General Exercises - Lower Extremity Ankle Circles/Pumps: AROM, 10 reps Long Arc Quad: AROM, 10 reps, Right (too painful on L) Hip ABduction/ADduction: Strengthening, 10 reps, Both    General Comments General comments (skin integrity, edema, etc.): Pt motivated,  but ultimately remains very functionally limited 2/2 L>R LE pain      Pertinent Vitals/Pain Pain Assessment Pain Assessment: 0-10 Pain Score: 8  Pain Location: L knee with WBing    Home Living                          Prior Function            PT Goals (current goals can now be found in the care plan section) Progress towards PT goals: Progressing toward goals    Frequency    BID      PT Plan      Co-evaluation              AM-PAC PT "6 Clicks" Mobility    Outcome Measure  Help needed turning from your back to your side while in a flat bed without using bedrails?: A Little Help needed moving from lying on your back to sitting on the side of a flat bed without using bedrails?: A Little Help needed moving to and from a bed to a chair (including a wheelchair)?: A Lot Help needed standing up from a chair using your arms (e.g., wheelchair or bedside chair)?: A Lot Help needed to walk in hospital room?: A Lot Help needed climbing 3-5 steps with a railing? : Total 6 Click Score: 13    End of Session Equipment Utilized During Treatment: Gait belt Activity Tolerance: Patient limited by pain Patient left: with call bell/phone within reach;with chair alarm set Nurse Communication: Mobility status PT Visit Diagnosis: History of falling (Z91.81);Other abnormalities of gait and mobility (R26.89);Muscle weakness (generalized) (M62.81);Pain Pain - Right/Left: Right Pain - part of body: Hip     Time: 1610-9604 PT Time Calculation (min) (ACUTE ONLY): 25 min  Charges:    $Therapeutic Exercise: 8-22 mins $Therapeutic Activity: 8-22 mins PT General Charges $$ ACUTE PT VISIT: 1 Visit                     Malachi Pro, DPT 03/25/2024, 1:37 PM

## 2024-03-25 NOTE — Progress Notes (Signed)
 Nutrition Follow-up  DOCUMENTATION CODES:   Obesity unspecified  INTERVENTION:   -Carb modified diet -Continue MVI with minerals daily -Continue Magic cup BID with meals, each supplement provides 290 kcal and 9 grams of protein   NUTRITION DIAGNOSIS:   Increased nutrient needs related to post-op healing as evidenced by estimated needs.  Ongoing  GOAL:   Patient will meet greater than or equal to 90% of their needs  Progressing   MONITOR:   PO intake, Supplement acceptance, Diet advancement  REASON FOR ASSESSMENT:   Consult Assessment of nutrition requirement/status, Hip fracture protocol  ASSESSMENT:   Pt with medical history significant for anxiety, diverticulosis, GERD, osteoarthritis, hypertension and dyslipidemia, who presented with acute onset of right hip pain after having a mechanical fall.  3/20- s/p Procedure: Right hip closed reduction percutaneous pinning   Reviewed I/O's: -420 ml x 24 hours and -2.2 L since admission  UOP: 900 ml x 24 hours   Pt remains with good appetite. Noted meal completions 50-100%.   No new wt since last visit.   Per TOC notes, PT now recommending SNF. Pt awaiting bed offer.   Medications reviewed and include colace and protonix.   Labs reviewed: CBGS: 139-230 (inpatient orders for glycemic control are 0-5 units insulin aspart daily at bedtime and 0-9 units insulin aspart TID with meals).    Diet Order:   Diet Order             Diet Carb Modified           Diet regular Room service appropriate? Yes; Fluid consistency: Thin  Diet effective now                   EDUCATION NEEDS:   Education needs have been addressed  Skin:  Skin Assessment: Skin Integrity Issues: Skin Integrity Issues:: Other (Comment) Incisions: closed rt hip Other: IAD to sacrum  Last BM:  03/25/24 (type 4)  Height:   Ht Readings from Last 1 Encounters:  03/18/24 5\' 4"  (1.626 m)    Weight:   Wt Readings from Last 1 Encounters:   03/18/24 83 kg    Ideal Body Weight:  54.5 kg  BMI:  Body mass index is 31.41 kg/m.  Estimated Nutritional Needs:   Kcal:  1700-1900  Protein:  90-105 grams  Fluid:  > 1.7 L    Levada Schilling, RD, LDN, CDCES Registered Dietitian III Certified Diabetes Care and Education Specialist If unable to reach this RD, please use "RD Inpatient" group chat on secure chat between hours of 8am-4 pm daily

## 2024-03-25 NOTE — Assessment & Plan Note (Signed)
Continue irbesartan. 

## 2024-03-25 NOTE — TOC Progression Note (Signed)
 Transition of Care [facility]) - Progression Note    Patient Details  Name: Erika Nelson MRN: [id number] Date of Birth: 1941/09/27  Transition of Care [facility]) CM/SW Contact  Marlowe Sax, RN Phone Number: 03/25/2024, 2:58 PM  Clinical Narrative:    The patient    Expected Discharge Plan: Home w Home Health Services Barriers to Discharge: Barriers Resolved  Expected Discharge Plan and Services   Discharge Planning Services: CM Consult   Living arrangements for the past 2 months: Single Family Home Expected Discharge Date: 03/20/24               DME Arranged: 3-N-1 DME Agency: [employer] Date DME Agency Contacted: 03/22/24 Time DME Agency Contacted: 1110 Representative spoke with at DME Agency: [place] HH Arranged: PT, OT HH Agency: [facility] Date [facility] Agency Contacted: 03/20/24 Time HH Agency Contacted: 1551 Representative spoke with at [facility] Agency: [place]   Social Determinants of Health (SDOH) Interventions SDOH Screenings   Food Insecurity: No Food Insecurity (03/19/2024)  Housing: Low Risk  (03/19/2024)  Transportation Needs: No Transportation Needs (03/19/2024)  Utilities: Not At Risk (03/19/2024)  Social Connections: Socially Isolated (03/19/2024)  Tobacco Use: Low Risk  (03/19/2024)  Recent Concern: Tobacco Use - Medium Risk (03/18/2024)   Received from [facility]    Readmission Risk Interventions     No data to display

## 2024-03-25 NOTE — Assessment & Plan Note (Signed)
 Continue working with physical therapy.  X-ray showing severe tricompartmental osteoarthritis with multiple intra articular loose bodies.  Patient and daughter are wanting to have a left knee replacement done at some point.

## 2024-03-26 DIAGNOSIS — F418 Other specified anxiety disorders: Secondary | ICD-10-CM

## 2024-03-26 DIAGNOSIS — I10 Essential (primary) hypertension: Secondary | ICD-10-CM | POA: Diagnosis not present

## 2024-03-26 DIAGNOSIS — F419 Anxiety disorder, unspecified: Secondary | ICD-10-CM | POA: Diagnosis not present

## 2024-03-26 DIAGNOSIS — E039 Hypothyroidism, unspecified: Secondary | ICD-10-CM

## 2024-03-26 DIAGNOSIS — M25562 Pain in left knee: Secondary | ICD-10-CM | POA: Diagnosis not present

## 2024-03-26 DIAGNOSIS — S72001S Fracture of unspecified part of neck of right femur, sequela: Secondary | ICD-10-CM | POA: Diagnosis not present

## 2024-03-26 LAB — GLUCOSE, CAPILLARY
Glucose-Capillary: 148 mg/dL — ABNORMAL HIGH (ref 70–99)
Glucose-Capillary: 182 mg/dL — ABNORMAL HIGH (ref 70–99)

## 2024-03-26 MED ORDER — OXYCODONE HCL 5 MG PO TABS: 5.0000 mg | ORAL_TABLET | Freq: Four times a day (QID) | ORAL | 0 refills | Status: AC | PRN

## 2024-03-26 MED ORDER — DOCUSATE SODIUM 100 MG PO CAPS: 100.0000 mg | ORAL_CAPSULE | Freq: Two times a day (BID) | ORAL | 0 refills | Status: AC

## 2024-03-26 MED ORDER — ASPIRIN 325 MG PO TBEC: 325.0000 mg | DELAYED_RELEASE_TABLET | Freq: Every day | ORAL | 1 refills | Status: AC

## 2024-03-26 NOTE — Plan of Care (Signed)
   Problem: Education: Goal: Knowledge of General Education information will improve Description: Including pain rating scale, medication(s)/side effects and non-pharmacologic comfort measures Outcome: Progressing   Problem: Clinical Measurements: Goal: Will remain free from infection Outcome: Progressing

## 2024-03-26 NOTE — Discharge Summary (Signed)
 Physician Discharge Summary   Patient: Erika Nelson MRN: [id number] DOB: 10/11/1941  Admit date:     03/18/2024  Discharge date: 03/26/24  Discharge Physician: Alford Highland   PCP: Danella Penton, MD   Recommendations at discharge:   Follow-up PCP 5 days Follow-up orthopedics in a few weeks Home health  Discharge Diagnoses: Principal Problem:   Closed hip fracture requiring operative repair, right, sequela Active Problems:   Left knee pain   Essential hypertension   Hypothyroidism   Anxiety and depression   Dyslipidemia   Type 2 diabetes mellitus without complications (HCC)   Subcapital fracture of hip, right, closed, initial encounter (HCC)   Obesity (BMI 30-39.9)    Hospital Course: 83 y.o. pleasant Caucasian female with medical history significant for anxiety, diverticulosis, GERD, osteoarthritis, hypertension and dyslipidemia, who presented to the emergency room with acute onset of right hip pain after having a mechanical fall.  The patient was coming out of her ophthalmology appointment walking with a cane and apparently lost balance falling on her right side.  She denied any presyncope or syncope.  No paresthesias or focal muscle weakness.  No chest pain or palpitations.  No tinnitus or vertigo.  No dysuria, oliguria or hematuria or flank pain.  No cough or wheezing or hemoptysis.  No bleeding diathesis.  No chest pain or palpitations.   ED Course: When the patient came to the ER, blood glucose was 164, BUN 30 and creatinine 1.24.  CBC was within normal. EKG as reviewed by me : EKG showed normal sinus rhythm with a rate of 78 with PACs. Imaging: Noncontrast right hip CT scan revealed mildly impacted subcapital femoral neck fracture.  Portable chest x-ray showed no acute cardiopulmonary disease.   The patient was given 150 mg of p.o. Diflucan for candidal eruption as well as Mycolog ointment.  She will be admitted to a medical-surgical bed for further evaluation and  management.  3/20.  Right hip closed reduction percutaneous pinning by Dr. Audelia Acton on 3/20. 3/26.  Patient walked 6 feet with physical therapy.  Family thinking about taking her home with home health rather than rehab. 3/27.  Discharged home with home health.  Change DVT prophylaxis over to aspirin rather than Lovenox injections.  Walked 50 feet with physical therapy.  Assessment and Plan: * Closed hip fracture requiring operative repair, right, sequela Right hip closed reduction and pinning by Dr. Audelia Acton on 3/20.  Family did not like any of the rehab bed choices so patient was discharged home with home health.  Lovenox injections switched over to aspirin for DVT prophylaxis since going home.  A few pain pills prescribed into her pharmacy.  Left knee pain Continue working with physical therapy.  X-ray showing severe tricompartmental osteoarthritis with multiple intra articular loose bodies.  Patient and daughter are wanting to have a left knee replacement done at some point.  I discussed this with Dr. Audelia Acton and he mentioned that it would not be done on this hospital stay.  They can discuss with him upon follow-up appointment.  Essential hypertension Continue irbesartan  Obesity (BMI 30-39.9) Class I with a BMI of 31.41.  Type 2 diabetes mellitus without complications (HCC) Hemoglobin A1c 6.1.   Dyslipidemia Continue Zocor  Anxiety and depression Continue Xanax and Paxil.  Hypothyroidism Continue Synthroid         Consultants: Orthopedic surgery Procedures performed: Right hip closed reduction percutaneous pinning Disposition: Home health Diet recommendation:  Discharge Diet Orders (From admission, onward)  Start     Ordered   03/20/24 0000  Diet Carb Modified        03/20/24 1702           Cardiac and Carb modified diet DISCHARGE MEDICATION: Allergies as of 03/26/2024       Reactions   Albuterol    Norco [hydrocodone-acetaminophen] Other (See Comments)    "shaky"   Prozac [fluoxetine Hcl] Other (See Comments)   "shaky"   Sulfa Antibiotics Rash   Tramadol Other (See Comments)   Sleepy        Medication List     TAKE these medications    acetaminophen 500 MG tablet Commonly known as: TYLENOL Take 1 tablet (500 mg total) by mouth every 6 (six) hours. What changed:  how much to take when to take this reasons to take this   ALPRAZolam 0.25 MG tablet Commonly known as: XANAX Take 0.25 mg by mouth at bedtime as needed.   aspirin EC 325 MG tablet Take 1 tablet (325 mg total) by mouth daily.   Biotin 10 MG Tabs 10 mg.   bismuth subsalicylate 262 MG/15ML suspension Commonly known as: PEPTO BISMOL Take 30 mLs by mouth every 6 (six) hours as needed for diarrhea or loose stools.   CENTRUM SILVER WOMEN 50+ PO Take 2 tablets by mouth daily at 10 pm.   docusate sodium 100 MG capsule Commonly known as: COLACE Take 1 capsule (100 mg total) by mouth 2 (two) times daily.   glipiZIDE 5 MG 24 hr tablet Commonly known as: GLUCOTROL XL Take 5 mg by mouth daily.   levothyroxine 75 MCG tablet Commonly known as: SYNTHROID Take 75 mcg by mouth daily before breakfast.   nystatin cream Commonly known as: MYCOSTATIN Apply 1 application  topically 2 (two) times daily as needed for dry skin.   olmesartan 20 MG tablet Commonly known as: [name] Take 20 mg by mouth daily.   omeprazole 40 MG capsule Commonly known as: PRILOSEC Take 40 mg by mouth daily.   oxyCODONE 5 MG immediate release tablet Commonly known as: Oxy IR/ROXICODONE Take 1 tablet (5 mg total) by mouth every 6 (six) hours as needed for severe pain (pain score 7-10) ((for MODERATE breakthrough pain)).   PARoxetine 20 MG tablet Commonly known as: PAXIL Take 20 mg by mouth daily.   simvastatin 20 MG tablet Commonly known as: ZOCOR Take 20 mg by mouth at bedtime.   Trelegy Ellipta 100-62.5-25 MCG/INH Aepb Generic drug: Fluticasone-Umeclidin-Vilant Inhale 1 puff  into the lungs daily at 6 (six) AM.               Durable Medical Equipment  (From admission, onward)           Start     Ordered   03/25/24 1613  For home use only DME standard manual wheelchair with seat cushion  Once       Comments: Patient suffers from hip fracture which impairs their ability to perform daily activities like toileting in the home.  A cane will not resolve issue with performing activities of daily living. A wheelchair will allow patient to safely perform daily activities. Patient can safely propel the wheelchair in the home or has a caregiver who can provide assistance. Length of need 12 months . Accessories: elevating leg rests (ELRs), wheel locks, extensions and anti-tippers.   03/25/24 1612   03/22/24 1042  For home use only DME Bedside commode  Once       Question:  Patient needs a bedside commode to treat with the following condition  Answer:  Generalized weakness   03/22/24 1041   03/20/24 1114  For home use only DME Walker rolling  Once       Question Answer Comment  Walker: With 5 Inch Wheels   Patient needs a walker to treat with the following condition Generalized weakness      03/20/24 1113            Follow-up Information     Evon Slack, PA-C Follow up in 2 week(s).   Specialties: Orthopedic Surgery, Emergency Medicine Contact information: [address] [place] [address] [phone]         Danella Penton, MD Follow up in 1 week(s).   Specialty: Internal Medicine Contact information: [address] [address] [facility] [address] [facility] [place] [facility] [place] [address] [phone]                Discharge Exam: Filed Weights   03/18/24 1711 03/18/24 1715  Weight: 83 kg 83 kg   Physical Exam HENT:     Head: Normocephalic.     Mouth/Throat:     Pharynx: No oropharyngeal exudate.  Eyes:     General: Lids are normal.     Conjunctiva/sclera: Conjunctivae normal.  Cardiovascular:     Rate and Rhythm:  Normal rate and regular rhythm.     Heart sounds: Normal heart sounds, S1 normal and S2 normal.  Pulmonary:     Breath sounds: No decreased breath sounds, wheezing, rhonchi or rales.  Abdominal:     Palpations: Abdomen is soft.     Tenderness: There is no abdominal tenderness.  Musculoskeletal:     Left knee: Swelling present.     Right lower leg: No swelling.     Left lower leg: No swelling.  Skin:    General: Skin is warm.     Findings: No rash.  Neurological:     Mental Status: She is alert.      Condition at discharge: Stable  The results of significant diagnostics from this hospitalization (including imaging, microbiology, ancillary and laboratory) are listed below for reference.   Imaging Studies: DG Knee Complete 4 Views Left Result Date: 03/24/2024 CLINICAL DATA:  Left knee pain EXAM: LEFT KNEE - COMPLETE 4+ VIEW COMPARISON:  11/04/2007 FINDINGS: Severe tricompartmental osteoarthritis of the left knee. Multiple intra-articular loose bodies of varying size. No appreciable joint effusion. No fracture or dislocation. No focal soft tissue swelling. IMPRESSION: Severe tricompartmental osteoarthritis of the left knee with multiple intra-articular loose bodies. Electronically Signed   By: Duanne Guess D.O.   On: 03/24/2024 14:58   DG FEMUR PORT, MIN 2 VIEWS RIGHT Result Date: 03/21/2024 CLINICAL DATA:  Right hip pain following cannulated hip pinning azygo. Pain radiates down right leg. EXAM: RIGHT FEMUR PORTABLE 2 VIEW COMPARISON:  Right knee radiographs 03/27/2024, intraoperative fluoroscopy right hip 03/27/2024, pelvis and right hip radiographs 03/18/2024, CT right hip 03/18/2024 FINDINGS: Interval fixation of the previously seen proximal right femoral fracture with 3 longitudinal screws. Redemonstration of total right knee arthroplasty. No perihardware lucency is seen to indicate hardware failure or loosening. No knee joint effusion. Mild to moderate right femoroacetabular joint  space narrowing. Mild pubic symphysis joint space narrowing and subchondral sclerosis. Mild-to-moderate inferior right sacroiliac subchondral sclerosis. Expected postoperative lateral right hip subcutaneous air. Chronic mineralized densities are again seen lateral to the right ilium. IMPRESSION: Interval fixation of the previously seen proximal right femoral fracture with 3  longitudinal screws. No evidence of hardware failure or loosening. Electronically Signed   By: Neita Garnet M.D.   On: 03/21/2024 12:14   DG HIP UNILAT WITH PELVIS 2-3 VIEWS RIGHT Result Date: 03/19/2024 CLINICAL DATA:  Elective surgery. EXAM: DG HIP (WITH OR WITHOUT PELVIS) 2-3V RIGHT COMPARISON:  Preoperative imaging FINDINGS: Two fluoroscopic spot views of the right hip submitted from the operating room. Three screws traverse femoral neck fracture. Fluoroscopy time 84 seconds. Dose 32.22 mGy. IMPRESSION: Intraoperative fluoroscopy during right femoral neck fracture fixation. Electronically Signed   By: Narda Rutherford M.D.   On: 03/19/2024 15:56   DG Knee Right Port Result Date: 03/19/2024 CLINICAL DATA:  Postop.  Right knee pain. EXAM: PORTABLE RIGHT KNEE - 1-2 VIEW COMPARISON:  07/26/2016 FINDINGS: Right knee arthroplasty in expected alignment. No acute or periprosthetic fracture. No periprosthetic lucency. Patellar resurfacing. No significant joint effusion. No erosive change. IMPRESSION: Right knee arthroplasty without complication. Electronically Signed   By: Narda Rutherford M.D.   On: 03/19/2024 15:54   DG C-Arm 1-60 Min-No Report Result Date: 03/19/2024 Fluoroscopy was utilized by the requesting physician.  No radiographic interpretation.   DG C-Arm 1-60 Min-No Report Result Date: 03/19/2024 Fluoroscopy was utilized by the requesting physician.  No radiographic interpretation.   DG Chest 1 View Result Date: 03/18/2024 CLINICAL DATA:  Preop, fall EXAM: CHEST  1 VIEW COMPARISON:  Radiograph and CT 12/06/2023 FINDINGS:  Stable cardiomediastinal silhouette. Aortic atherosclerotic calcification. No focal consolidation, pleural effusion, or pneumothorax. No displaced rib fractures. IMPRESSION: No active disease. Electronically Signed   By: Minerva Fester M.D.   On: 03/18/2024 23:22   CT Hip Right Wo Contrast Result Date: 03/18/2024 CLINICAL DATA:  Hip trauma, fracture suspected.  Fell 10 days ago. EXAM: CT OF THE RIGHT HIP WITHOUT CONTRAST TECHNIQUE: Multidetector CT imaging of the right hip was performed according to the standard protocol. Multiplanar CT image reconstructions were also generated. RADIATION DOSE REDUCTION: This exam was performed according to the departmental dose-optimization program which includes automated exposure control, adjustment of the mA and/or kV according to patient size and/or use of iterative reconstruction technique. COMPARISON:  CT abdomen pelvis 12/06/2023 FINDINGS: Bones/Joint/Cartilage Nondisplaced mildly impacted subcapital femoral neck fracture. No dislocation. Ligaments Suboptimally assessed by CT. Muscles and Tendons No acute abnormality. Soft tissues Unremarkable. IMPRESSION: Nondisplaced mildly impacted subcapital femoral neck fracture. Electronically Signed   By: Minerva Fester M.D.   On: 03/18/2024 23:19   DG Hip Unilat W or Wo Pelvis 2-3 Views Right Result Date: 03/18/2024 CLINICAL DATA:  Hip pain. Fall 10 days ago. Patient reports right hip fracture at [facility] today. EXAM: DG HIP (WITH OR WITHOUT PELVIS) 2-3V RIGHT COMPARISON:  None Available. FINDINGS: Slight cortical irregularity about the lateral aspect of the right femoral neck may represent a nondisplaced fracture. No additional fracture of the pelvis. No hip dislocation. Mild bilateral hip osteoarthritis. Pubic rami are intact. Pubic symphysis and sacroiliac joints are congruent. IMPRESSION: Slight cortical irregularity about the lateral aspect of the right femoral neck may represent a nondisplaced fracture. Recommend further  assessment with CT. Electronically Signed   By: Narda Rutherford M.D.   On: 03/18/2024 18:36   MR BRAIN WO CONTRAST Result Date: 03/04/2024 CLINICAL DATA:  Provided history: Acute CVA (cerebrovascular accident). Vision loss of right eye. EXAM: MRI HEAD WITHOUT CONTRAST TECHNIQUE: Multiplanar, multiecho pulse sequences of the brain and surrounding structures were obtained without intravenous contrast. COMPARISON:  Head CT 12/06/2023. FINDINGS: Brain: Generalized parenchymal atrophy, mild for age.  Chronic lacunar infarcts within the left frontal lobe centrum semiovale and bilateral thalami. Background multifocal T2 FLAIR hyperintense signal abnormality within the cerebral white matter, nonspecific but compatible with moderate chronic small vessel ischemic disease. There is no acute infarct. No evidence of an intracranial mass. No chronic intracranial blood products. No extra-axial fluid collection. No midline shift. Vascular: Maintained flow voids within the proximal large arterial vessels. Skull and upper cervical spine: No focal worrisome marrow lesion. Incompletely assessed cervical spondylosis. Sinuses/Orbits: No mass or acute finding within the imaged orbits. Mild mucosal thickening within a posterior left ethmoid air cell. IMPRESSION: 1. No evidence of an acute intracranial abnormality. 2. Chronic lacunar infarcts within the left frontal lobe centrum semiovale and bilateral thalami. 3. Background moderate cerebral white matter chronic small vessel ischemic disease. 4. Mild generalized parenchymal atrophy. 5. Minor left ethmoid sinus mucosal thickening. Electronically Signed   By: Jackey Loge D.O.   On: 03/04/2024 17:05    Microbiology: Results for orders placed or performed during the hospital encounter of 03/18/24  MRSA Next Gen by PCR, Nasal     Status: None   Collection Time: 03/19/24  6:47 AM   Specimen: Nasal Mucosa; Nasal Swab  Result Value Ref Range Status   MRSA by PCR Next Gen NOT DETECTED  NOT DETECTED Final    Comment: (NOTE) The GeneXpert MRSA Assay (FDA approved for NASAL specimens only), is one component of a comprehensive MRSA colonization surveillance program. It is not intended to diagnose MRSA infection nor to guide or monitor treatment for MRSA infections. Test performance is not FDA approved in patients less than 23 years old. Performed at [facility], [address] [place], [place] [address]     Labs: CBC: Recent Labs  Lab 03/20/24 0408 03/21/24 0553 03/22/24 0154 03/23/24 0602 03/24/24 0514  WBC 11.2* 9.2 8.9 8.0 7.5  HGB 12.6 12.3 11.7* 12.3 12.4  HCT 35.7* 35.9* 35.1* 35.8* 36.1  MCV 84.4 87.6 89.1 86.7 86.2  PLT 204 187 180 194 202   Basic Metabolic Panel: Recent Labs  Lab 03/21/24 0553 03/22/24 0154 03/23/24 0602 03/24/24 0514 03/25/24 0427  NA 137 135 137 136 135  K 4.0 4.5 4.5 4.5 4.9  CL 107 106 106 103 104  CO2 21* 24 25 24 24   GLUCOSE 198* 129* 131* 142* 143*  BUN 42* 40* 41* 47* 50*  CREATININE 1.40* 1.26* 1.38* 1.28* 1.41*  CALCIUM 9.0 8.7* 9.6 9.7 9.7   Liver Function Tests: No results for input(s): "AST", "ALT", "ALKPHOS", "BILITOT", "PROT", "ALBUMIN" in the last 168 hours. CBG: Recent Labs  Lab 03/25/24 1127 03/25/24 1605 03/25/24 2145 03/26/24 0734 03/26/24 1154  GLUCAP 230* 186* 194* 148* 182*    Discharge time spent: greater than 30 minutes.  Signed: Alford Highland, MD [facility] 03/26/2024

## 2024-03-26 NOTE — Plan of Care (Signed)
  Problem: Education: Goal: Knowledge of General Education information will improve Description: Including pain rating scale, medication(s)/side effects and non-pharmacologic comfort measures Outcome: Adequate for Discharge   Problem: Health Behavior/Discharge Planning: Goal: Ability to manage health-related needs will improve Outcome: Adequate for Discharge   Problem: Clinical Measurements: Goal: Ability to maintain clinical measurements within normal limits will improve Outcome: Adequate for Discharge Goal: Will remain free from infection Outcome: Adequate for Discharge Goal: Diagnostic test results will improve Outcome: Adequate for Discharge Goal: Respiratory complications will improve Outcome: Adequate for Discharge Goal: Cardiovascular complication will be avoided Outcome: Adequate for Discharge   Problem: Activity: Goal: Risk for activity intolerance will decrease Outcome: Adequate for Discharge   Problem: Nutrition: Goal: Adequate nutrition will be maintained Outcome: Adequate for Discharge   Problem: Coping: Goal: Level of anxiety will decrease Outcome: Adequate for Discharge   Problem: Elimination: Goal: Will not experience complications related to bowel motility Outcome: Adequate for Discharge Goal: Will not experience complications related to urinary retention Outcome: Adequate for Discharge   Problem: Pain Managment: Goal: General experience of comfort will improve and/or be controlled Outcome: Adequate for Discharge   Problem: Safety: Goal: Ability to remain free from injury will improve Outcome: Adequate for Discharge   Problem: Skin Integrity: Goal: Risk for impaired skin integrity will decrease Outcome: Adequate for Discharge   Problem: Education: Goal: Ability to describe self-care measures that may prevent or decrease complications (Diabetes Survival Skills Education) will improve Outcome: Adequate for Discharge Goal: Individualized Educational  Video(s) Outcome: Adequate for Discharge   Problem: Coping: Goal: Ability to adjust to condition or change in health will improve Outcome: Adequate for Discharge   Problem: Fluid Volume: Goal: Ability to maintain a balanced intake and output will improve Outcome: Adequate for Discharge   Problem: Health Behavior/Discharge Planning: Goal: Ability to identify and utilize available resources and services will improve Outcome: Adequate for Discharge Goal: Ability to manage health-related needs will improve Outcome: Adequate for Discharge   Problem: Metabolic: Goal: Ability to maintain appropriate glucose levels will improve Outcome: Adequate for Discharge   Problem: Nutritional: Goal: Maintenance of adequate nutrition will improve Outcome: Adequate for Discharge Goal: Progress toward achieving an optimal weight will improve Outcome: Adequate for Discharge   Problem: Skin Integrity: Goal: Risk for impaired skin integrity will decrease Outcome: Adequate for Discharge   Problem: Tissue Perfusion: Goal: Adequacy of tissue perfusion will improve Outcome: Adequate for Discharge   Problem: Education: Goal: Verbalization of understanding the information provided (i.e., activity precautions, restrictions, etc) will improve Outcome: Adequate for Discharge Goal: Individualized Educational Video(s) Outcome: Adequate for Discharge   Problem: Activity: Goal: Ability to ambulate and perform ADLs will improve Outcome: Adequate for Discharge   Problem: Clinical Measurements: Goal: Postoperative complications will be avoided or minimized Outcome: Adequate for Discharge   Problem: Self-Concept: Goal: Ability to maintain and perform role responsibilities to the fullest extent possible will improve Outcome: Adequate for Discharge   Problem: Pain Management: Goal: Pain level will decrease Outcome: Adequate for Discharge

## 2024-03-26 NOTE — Progress Notes (Signed)
 Physical Therapy Treatment Patient Details Name: Erika Nelson MRN: [id number] DOB: June 23, 1941 Today's Date: 03/26/2024   History of Present Illness 83 y.o. pleasant female with medical history significant for anxiety, mild dementia, diverticulosis, GERD, osteoarthritis, hypertension and dyslipidemia, who presented to the emergency room with acute onset of right hip pain after having a mechanical fall. Pt diagnosed with right hip impacted femoral neck fracture and is s/p closed reduction percutaneous pinning.    PT Comments  Pt received in bed, able to transfer to edge with Supervision and use of side rail. Continues to require MinA for transfers with support of RW for balance once standing. Ongoing L knee pain during gait training with RW ~98ft with close CGA incase of buckling. (L Knee x-ray 03/24/24 Severe tricompartmental osteoarthritis of the left knee with multiple intra-articular loose bodies) Pt positioned to comfort in chair, all needs in reach. BSC, w/c and cushion in room for d/c home after declining STR   If plan is discharge home, recommend the following: Supervision due to cognitive status;A little help with bathing/dressing/bathroom;Assistance with cooking/housework;Help with stairs or ramp for entrance;Assist for transportation;Two people to help with walking and/or transfers   Can travel by private vehicle     No  Equipment Recommendations  Rolling walker (2 wheels);BSC/3in1;Wheelchair (measurements PT);Wheelchair cushion (measurements PT);Other (comment) (Pt declines SNF)    Recommendations for Other Services       Precautions / Restrictions Precautions Precautions: Fall Restrictions Weight Bearing Restrictions Per Provider Order: Yes RLE Weight Bearing Per Provider Order: Weight bearing as tolerated     Mobility  Bed Mobility Overal bed mobility: Needs Assistance Bed Mobility: Sit to Supine, Supine to Sit     Supine to sit: Supervision, Used rails     General  bed mobility comments: Able to transfer to EOB with bed rail and Supervision    Transfers Overall transfer level: Needs assistance Equipment used: Rolling walker (2 wheels) Transfers: Sit to/from Stand Sit to Stand: Min assist           General transfer comment: MinA to stand from bed and BSC, cues for hand placemnet    Ambulation/Gait Ambulation/Gait assistance: Contact guard assist Gait Distance (Feet): 50 Feet Assistive device: Rolling walker (2 wheels) Gait Pattern/deviations: Step-through pattern, Decreased step length - right, Decreased step length - left, Trunk flexed, Decreased weight shift to left, Antalgic Gait velocity: decreased     General Gait Details: Improved tolerance for activity. L knee continues to be more painful than recent R hip surgery resulting in high fall risk   Stairs Stairs: Yes       General stair comments: Unsafe for stair negotiation. Pt has a w/c for home, son and grandson to bump her up in w/c   Wheelchair Mobility     Tilt Bed    Modified Rankin (Stroke Patients Only)       Balance Overall balance assessment: Needs assistance, History of Falls Sitting-balance support: Feet supported Sitting balance-Leahy Scale: Good     Standing balance support: Bilateral upper extremity supported, During functional activity, Reliant on assistive device for balance Standing balance-Leahy Scale: Fair Standing balance comment: high fall risk                            Communication Communication Communication: No apparent difficulties  Cognition Arousal: Alert Behavior During Therapy: Anxious   PT - Cognitive impairments: No apparent impairments  Following commands: Intact      Cueing Cueing Techniques: Verbal cues, Visual cues, Tactile cues  Exercises Total Joint Exercises Long Arc Quad: AROM, Strengthening, Both, 10 reps Knee Flexion: AROM, Strengthening, Both, 10 reps General  Exercises - Lower Extremity Ankle Circles/Pumps: AROM, 10 reps Other Exercises Other Exercises: Pt educated on role of PT and safety recs for d/c home    General Comments General comments (skin integrity, edema, etc.): Pt anxious at baseline, L knee pain continues      Pertinent Vitals/Pain Pain Assessment Pain Assessment: 0-10 Pain Score: 3  Pain Location: L knee Pain Descriptors / Indicators: Sharp, Aching Pain Intervention(s): Limited activity within patient's tolerance    Home Living                          Prior Function            PT Goals (current goals can now be found in the care plan section) Acute Rehab PT Goals Patient Stated Goal: To walk without pain Progress towards PT goals: Progressing toward goals    Frequency    BID      PT Plan      Co-evaluation              AM-PAC PT "6 Clicks" Mobility   Outcome Measure  Help needed turning from your back to your side while in a flat bed without using bedrails?: A Little Help needed moving from lying on your back to sitting on the side of a flat bed without using bedrails?: A Little Help needed moving to and from a bed to a chair (including a wheelchair)?: A Little Help needed standing up from a chair using your arms (e.g., wheelchair or bedside chair)?: A Little Help needed to walk in hospital room?: A Lot Help needed climbing 3-5 steps with a railing? : Total 6 Click Score: 15    End of Session Equipment Utilized During Treatment: Gait belt Activity Tolerance: Patient limited by pain Patient left: in chair;with call bell/phone within reach;with chair alarm set Nurse Communication: Mobility status PT Visit Diagnosis: History of falling (Z91.81);Other abnormalities of gait and mobility (R26.89);Muscle weakness (generalized) (M62.81);Pain Pain - Right/Left: Right Pain - part of body: Hip     Time: 1027-2536 PT Time Calculation (min) (ACUTE ONLY): 32 min  Charges:    $Gait  Training: 8-22 mins $Therapeutic Exercise: 8-22 mins PT General Charges $$ ACUTE PT VISIT: 1 Visit                    Zadie Cleverly, PTA  Jannet Askew 03/26/2024, 1:47 PM

## 2024-03-26 NOTE — TOC Progression Note (Signed)
 Transition of Care [facility]) - Progression Note    Patient Details  Name: Erika Nelson MRN: [id number] Date of Birth: 09-13-1941  Transition of Care [facility]) CM/SW Contact  Marlowe Sax, RN Phone Number: 03/26/2024, 9:13 AM  Clinical Narrative:     Wheelchair delivered to the room, son plans to take her home today he gets off work at 2 PM  Expected Discharge Plan: Home w Home Health Services Barriers to Discharge: Barriers Resolved  Expected Discharge Plan and Services   Discharge Planning Services: CM Consult   Living arrangements for the past 2 months: Single Family Home Expected Discharge Date: 03/20/24               DME Arranged: 3-N-1 DME Agency: [employer] Date DME Agency Contacted: 03/22/24 Time DME Agency Contacted: [id number] Representative spoke with at DME Agency: [place] HH Arranged: PT, OT HH Agency: [facility] Date HH Agency Contacted: 03/20/24 Time HH Agency Contacted: [id number] Representative spoke with at [facility] Agency: [place]   Social Determinants of Health (SDOH) Interventions SDOH Screenings   Food Insecurity: No Food Insecurity (03/19/2024)  Housing: Low Risk  (03/19/2024)  Transportation Needs: No Transportation Needs (03/19/2024)  Utilities: Not At Risk (03/19/2024)  Social Connections: Socially Isolated (03/19/2024)  Tobacco Use: Low Risk  (03/19/2024)  Recent Concern: Tobacco Use - Medium Risk (03/18/2024)   Received from [facility]    Readmission Risk Interventions     No data to display

## 2024-03-26 NOTE — Care Management Important Message (Signed)
 Important Message  Patient Details  Name: Erika Nelson MRN: [id number] Date of Birth: 09/21/1941   Important Message Given:  Yes - Medicare IM     Bernadette Hoit 03/26/2024, 2:24 PM

## 2024-04-02 DIAGNOSIS — R29898 Other symptoms and signs involving the musculoskeletal system: Secondary | ICD-10-CM | POA: Diagnosis not present

## 2024-04-02 DIAGNOSIS — L89103 Pressure ulcer of unspecified part of back, stage 3: Secondary | ICD-10-CM | POA: Diagnosis not present

## 2024-04-02 DIAGNOSIS — S7291XD Unspecified fracture of right femur, subsequent encounter for closed fracture with routine healing: Secondary | ICD-10-CM | POA: Diagnosis not present

## 2024-04-04 DIAGNOSIS — E669 Obesity, unspecified: Secondary | ICD-10-CM | POA: Diagnosis not present

## 2024-04-04 DIAGNOSIS — S72011D Unspecified intracapsular fracture of right femur, subsequent encounter for closed fracture with routine healing: Secondary | ICD-10-CM | POA: Diagnosis not present

## 2024-04-04 DIAGNOSIS — F32A Depression, unspecified: Secondary | ICD-10-CM | POA: Diagnosis not present

## 2024-04-04 DIAGNOSIS — I482 Chronic atrial fibrillation, unspecified: Secondary | ICD-10-CM | POA: Diagnosis not present

## 2024-04-04 DIAGNOSIS — F02A4 Dementia in other diseases classified elsewhere, mild, with anxiety: Secondary | ICD-10-CM | POA: Diagnosis not present

## 2024-04-04 DIAGNOSIS — E782 Mixed hyperlipidemia: Secondary | ICD-10-CM | POA: Diagnosis not present

## 2024-04-04 DIAGNOSIS — E039 Hypothyroidism, unspecified: Secondary | ICD-10-CM | POA: Diagnosis not present

## 2024-04-04 DIAGNOSIS — F02A3 Dementia in other diseases classified elsewhere, mild, with mood disturbance: Secondary | ICD-10-CM | POA: Diagnosis not present

## 2024-04-04 DIAGNOSIS — E1169 Type 2 diabetes mellitus with other specified complication: Secondary | ICD-10-CM | POA: Diagnosis not present

## 2024-04-08 DIAGNOSIS — H34831 Tributary (branch) retinal vein occlusion, right eye, with macular edema: Secondary | ICD-10-CM | POA: Diagnosis not present

## 2024-04-08 DIAGNOSIS — S72001D Fracture of unspecified part of neck of right femur, subsequent encounter for closed fracture with routine healing: Secondary | ICD-10-CM | POA: Diagnosis not present

## 2024-04-09 DIAGNOSIS — E039 Hypothyroidism, unspecified: Secondary | ICD-10-CM | POA: Diagnosis not present

## 2024-04-09 DIAGNOSIS — S72011D Unspecified intracapsular fracture of right femur, subsequent encounter for closed fracture with routine healing: Secondary | ICD-10-CM | POA: Diagnosis not present

## 2024-04-09 DIAGNOSIS — E1169 Type 2 diabetes mellitus with other specified complication: Secondary | ICD-10-CM | POA: Diagnosis not present

## 2024-04-09 DIAGNOSIS — F02A4 Dementia in other diseases classified elsewhere, mild, with anxiety: Secondary | ICD-10-CM | POA: Diagnosis not present

## 2024-04-09 DIAGNOSIS — F32A Depression, unspecified: Secondary | ICD-10-CM | POA: Diagnosis not present

## 2024-04-09 DIAGNOSIS — E669 Obesity, unspecified: Secondary | ICD-10-CM | POA: Diagnosis not present

## 2024-04-09 DIAGNOSIS — F02A3 Dementia in other diseases classified elsewhere, mild, with mood disturbance: Secondary | ICD-10-CM | POA: Diagnosis not present

## 2024-04-09 DIAGNOSIS — E782 Mixed hyperlipidemia: Secondary | ICD-10-CM | POA: Diagnosis not present

## 2024-04-09 DIAGNOSIS — I482 Chronic atrial fibrillation, unspecified: Secondary | ICD-10-CM | POA: Diagnosis not present

## 2024-04-14 DIAGNOSIS — E1169 Type 2 diabetes mellitus with other specified complication: Secondary | ICD-10-CM | POA: Diagnosis not present

## 2024-04-14 DIAGNOSIS — E782 Mixed hyperlipidemia: Secondary | ICD-10-CM | POA: Diagnosis not present

## 2024-04-14 DIAGNOSIS — F02A4 Dementia in other diseases classified elsewhere, mild, with anxiety: Secondary | ICD-10-CM | POA: Diagnosis not present

## 2024-04-14 DIAGNOSIS — E039 Hypothyroidism, unspecified: Secondary | ICD-10-CM | POA: Diagnosis not present

## 2024-04-14 DIAGNOSIS — E669 Obesity, unspecified: Secondary | ICD-10-CM | POA: Diagnosis not present

## 2024-04-14 DIAGNOSIS — F02A3 Dementia in other diseases classified elsewhere, mild, with mood disturbance: Secondary | ICD-10-CM | POA: Diagnosis not present

## 2024-04-14 DIAGNOSIS — S72011D Unspecified intracapsular fracture of right femur, subsequent encounter for closed fracture with routine healing: Secondary | ICD-10-CM | POA: Diagnosis not present

## 2024-04-14 DIAGNOSIS — F32A Depression, unspecified: Secondary | ICD-10-CM | POA: Diagnosis not present

## 2024-04-14 DIAGNOSIS — I482 Chronic atrial fibrillation, unspecified: Secondary | ICD-10-CM | POA: Diagnosis not present

## 2024-04-15 DIAGNOSIS — E1169 Type 2 diabetes mellitus with other specified complication: Secondary | ICD-10-CM | POA: Diagnosis not present

## 2024-04-15 DIAGNOSIS — F02A4 Dementia in other diseases classified elsewhere, mild, with anxiety: Secondary | ICD-10-CM | POA: Diagnosis not present

## 2024-04-15 DIAGNOSIS — E782 Mixed hyperlipidemia: Secondary | ICD-10-CM | POA: Diagnosis not present

## 2024-04-15 DIAGNOSIS — F02A3 Dementia in other diseases classified elsewhere, mild, with mood disturbance: Secondary | ICD-10-CM | POA: Diagnosis not present

## 2024-04-15 DIAGNOSIS — E039 Hypothyroidism, unspecified: Secondary | ICD-10-CM | POA: Diagnosis not present

## 2024-04-15 DIAGNOSIS — I482 Chronic atrial fibrillation, unspecified: Secondary | ICD-10-CM | POA: Diagnosis not present

## 2024-04-15 DIAGNOSIS — E669 Obesity, unspecified: Secondary | ICD-10-CM | POA: Diagnosis not present

## 2024-04-15 DIAGNOSIS — S72011D Unspecified intracapsular fracture of right femur, subsequent encounter for closed fracture with routine healing: Secondary | ICD-10-CM | POA: Diagnosis not present

## 2024-04-15 DIAGNOSIS — F32A Depression, unspecified: Secondary | ICD-10-CM | POA: Diagnosis not present

## 2024-04-17 DIAGNOSIS — E039 Hypothyroidism, unspecified: Secondary | ICD-10-CM | POA: Diagnosis not present

## 2024-04-17 DIAGNOSIS — F32A Depression, unspecified: Secondary | ICD-10-CM | POA: Diagnosis not present

## 2024-04-17 DIAGNOSIS — E669 Obesity, unspecified: Secondary | ICD-10-CM | POA: Diagnosis not present

## 2024-04-17 DIAGNOSIS — E782 Mixed hyperlipidemia: Secondary | ICD-10-CM | POA: Diagnosis not present

## 2024-04-17 DIAGNOSIS — S72011D Unspecified intracapsular fracture of right femur, subsequent encounter for closed fracture with routine healing: Secondary | ICD-10-CM | POA: Diagnosis not present

## 2024-04-17 DIAGNOSIS — I482 Chronic atrial fibrillation, unspecified: Secondary | ICD-10-CM | POA: Diagnosis not present

## 2024-04-17 DIAGNOSIS — F02A3 Dementia in other diseases classified elsewhere, mild, with mood disturbance: Secondary | ICD-10-CM | POA: Diagnosis not present

## 2024-04-17 DIAGNOSIS — E1169 Type 2 diabetes mellitus with other specified complication: Secondary | ICD-10-CM | POA: Diagnosis not present

## 2024-04-17 DIAGNOSIS — F02A4 Dementia in other diseases classified elsewhere, mild, with anxiety: Secondary | ICD-10-CM | POA: Diagnosis not present

## 2024-04-22 DIAGNOSIS — F02A3 Dementia in other diseases classified elsewhere, mild, with mood disturbance: Secondary | ICD-10-CM | POA: Diagnosis not present

## 2024-04-22 DIAGNOSIS — E1169 Type 2 diabetes mellitus with other specified complication: Secondary | ICD-10-CM | POA: Diagnosis not present

## 2024-04-22 DIAGNOSIS — I482 Chronic atrial fibrillation, unspecified: Secondary | ICD-10-CM | POA: Diagnosis not present

## 2024-04-22 DIAGNOSIS — E782 Mixed hyperlipidemia: Secondary | ICD-10-CM | POA: Diagnosis not present

## 2024-04-22 DIAGNOSIS — F02A4 Dementia in other diseases classified elsewhere, mild, with anxiety: Secondary | ICD-10-CM | POA: Diagnosis not present

## 2024-04-22 DIAGNOSIS — E039 Hypothyroidism, unspecified: Secondary | ICD-10-CM | POA: Diagnosis not present

## 2024-04-22 DIAGNOSIS — E669 Obesity, unspecified: Secondary | ICD-10-CM | POA: Diagnosis not present

## 2024-04-22 DIAGNOSIS — F32A Depression, unspecified: Secondary | ICD-10-CM | POA: Diagnosis not present

## 2024-04-22 DIAGNOSIS — S72011D Unspecified intracapsular fracture of right femur, subsequent encounter for closed fracture with routine healing: Secondary | ICD-10-CM | POA: Diagnosis not present

## 2024-04-23 DIAGNOSIS — E782 Mixed hyperlipidemia: Secondary | ICD-10-CM | POA: Diagnosis not present

## 2024-04-23 DIAGNOSIS — E039 Hypothyroidism, unspecified: Secondary | ICD-10-CM | POA: Diagnosis not present

## 2024-04-23 DIAGNOSIS — S72011D Unspecified intracapsular fracture of right femur, subsequent encounter for closed fracture with routine healing: Secondary | ICD-10-CM | POA: Diagnosis not present

## 2024-04-23 DIAGNOSIS — F02A3 Dementia in other diseases classified elsewhere, mild, with mood disturbance: Secondary | ICD-10-CM | POA: Diagnosis not present

## 2024-04-23 DIAGNOSIS — F32A Depression, unspecified: Secondary | ICD-10-CM | POA: Diagnosis not present

## 2024-04-23 DIAGNOSIS — I482 Chronic atrial fibrillation, unspecified: Secondary | ICD-10-CM | POA: Diagnosis not present

## 2024-04-23 DIAGNOSIS — E1169 Type 2 diabetes mellitus with other specified complication: Secondary | ICD-10-CM | POA: Diagnosis not present

## 2024-04-23 DIAGNOSIS — E669 Obesity, unspecified: Secondary | ICD-10-CM | POA: Diagnosis not present

## 2024-04-23 DIAGNOSIS — F02A4 Dementia in other diseases classified elsewhere, mild, with anxiety: Secondary | ICD-10-CM | POA: Diagnosis not present

## 2024-04-24 DIAGNOSIS — S72011D Unspecified intracapsular fracture of right femur, subsequent encounter for closed fracture with routine healing: Secondary | ICD-10-CM | POA: Diagnosis not present

## 2024-04-24 DIAGNOSIS — E1169 Type 2 diabetes mellitus with other specified complication: Secondary | ICD-10-CM | POA: Diagnosis not present

## 2024-04-24 DIAGNOSIS — E039 Hypothyroidism, unspecified: Secondary | ICD-10-CM | POA: Diagnosis not present

## 2024-04-24 DIAGNOSIS — F32A Depression, unspecified: Secondary | ICD-10-CM | POA: Diagnosis not present

## 2024-04-24 DIAGNOSIS — E669 Obesity, unspecified: Secondary | ICD-10-CM | POA: Diagnosis not present

## 2024-04-24 DIAGNOSIS — F02A3 Dementia in other diseases classified elsewhere, mild, with mood disturbance: Secondary | ICD-10-CM | POA: Diagnosis not present

## 2024-04-24 DIAGNOSIS — F02A4 Dementia in other diseases classified elsewhere, mild, with anxiety: Secondary | ICD-10-CM | POA: Diagnosis not present

## 2024-04-24 DIAGNOSIS — I482 Chronic atrial fibrillation, unspecified: Secondary | ICD-10-CM | POA: Diagnosis not present

## 2024-04-24 DIAGNOSIS — E782 Mixed hyperlipidemia: Secondary | ICD-10-CM | POA: Diagnosis not present

## 2024-04-30 DIAGNOSIS — I482 Chronic atrial fibrillation, unspecified: Secondary | ICD-10-CM | POA: Diagnosis not present

## 2024-04-30 DIAGNOSIS — E039 Hypothyroidism, unspecified: Secondary | ICD-10-CM | POA: Diagnosis not present

## 2024-04-30 DIAGNOSIS — S72011D Unspecified intracapsular fracture of right femur, subsequent encounter for closed fracture with routine healing: Secondary | ICD-10-CM | POA: Diagnosis not present

## 2024-04-30 DIAGNOSIS — E782 Mixed hyperlipidemia: Secondary | ICD-10-CM | POA: Diagnosis not present

## 2024-04-30 DIAGNOSIS — F02A4 Dementia in other diseases classified elsewhere, mild, with anxiety: Secondary | ICD-10-CM | POA: Diagnosis not present

## 2024-04-30 DIAGNOSIS — F02A3 Dementia in other diseases classified elsewhere, mild, with mood disturbance: Secondary | ICD-10-CM | POA: Diagnosis not present

## 2024-04-30 DIAGNOSIS — E1169 Type 2 diabetes mellitus with other specified complication: Secondary | ICD-10-CM | POA: Diagnosis not present

## 2024-04-30 DIAGNOSIS — E669 Obesity, unspecified: Secondary | ICD-10-CM | POA: Diagnosis not present

## 2024-04-30 DIAGNOSIS — F32A Depression, unspecified: Secondary | ICD-10-CM | POA: Diagnosis not present

## 2024-05-01 DIAGNOSIS — F02A4 Dementia in other diseases classified elsewhere, mild, with anxiety: Secondary | ICD-10-CM | POA: Diagnosis not present

## 2024-05-01 DIAGNOSIS — E669 Obesity, unspecified: Secondary | ICD-10-CM | POA: Diagnosis not present

## 2024-05-01 DIAGNOSIS — F02A3 Dementia in other diseases classified elsewhere, mild, with mood disturbance: Secondary | ICD-10-CM | POA: Diagnosis not present

## 2024-05-01 DIAGNOSIS — I482 Chronic atrial fibrillation, unspecified: Secondary | ICD-10-CM | POA: Diagnosis not present

## 2024-05-01 DIAGNOSIS — E039 Hypothyroidism, unspecified: Secondary | ICD-10-CM | POA: Diagnosis not present

## 2024-05-01 DIAGNOSIS — E1169 Type 2 diabetes mellitus with other specified complication: Secondary | ICD-10-CM | POA: Diagnosis not present

## 2024-05-01 DIAGNOSIS — E782 Mixed hyperlipidemia: Secondary | ICD-10-CM | POA: Diagnosis not present

## 2024-05-01 DIAGNOSIS — F32A Depression, unspecified: Secondary | ICD-10-CM | POA: Diagnosis not present

## 2024-05-01 DIAGNOSIS — S72011D Unspecified intracapsular fracture of right femur, subsequent encounter for closed fracture with routine healing: Secondary | ICD-10-CM | POA: Diagnosis not present

## 2024-05-02 DIAGNOSIS — E039 Hypothyroidism, unspecified: Secondary | ICD-10-CM | POA: Diagnosis not present

## 2024-05-02 DIAGNOSIS — E1169 Type 2 diabetes mellitus with other specified complication: Secondary | ICD-10-CM | POA: Diagnosis not present

## 2024-05-02 DIAGNOSIS — F02A4 Dementia in other diseases classified elsewhere, mild, with anxiety: Secondary | ICD-10-CM | POA: Diagnosis not present

## 2024-05-02 DIAGNOSIS — S72011D Unspecified intracapsular fracture of right femur, subsequent encounter for closed fracture with routine healing: Secondary | ICD-10-CM | POA: Diagnosis not present

## 2024-05-02 DIAGNOSIS — F32A Depression, unspecified: Secondary | ICD-10-CM | POA: Diagnosis not present

## 2024-05-02 DIAGNOSIS — F02A3 Dementia in other diseases classified elsewhere, mild, with mood disturbance: Secondary | ICD-10-CM | POA: Diagnosis not present

## 2024-05-02 DIAGNOSIS — E782 Mixed hyperlipidemia: Secondary | ICD-10-CM | POA: Diagnosis not present

## 2024-05-02 DIAGNOSIS — I482 Chronic atrial fibrillation, unspecified: Secondary | ICD-10-CM | POA: Diagnosis not present

## 2024-05-02 DIAGNOSIS — E669 Obesity, unspecified: Secondary | ICD-10-CM | POA: Diagnosis not present

## 2024-05-04 DIAGNOSIS — L89300 Pressure ulcer of unspecified buttock, unstageable: Secondary | ICD-10-CM | POA: Diagnosis not present

## 2024-05-04 DIAGNOSIS — I482 Chronic atrial fibrillation, unspecified: Secondary | ICD-10-CM | POA: Diagnosis not present

## 2024-05-04 DIAGNOSIS — E039 Hypothyroidism, unspecified: Secondary | ICD-10-CM | POA: Diagnosis not present

## 2024-05-04 DIAGNOSIS — E11622 Type 2 diabetes mellitus with other skin ulcer: Secondary | ICD-10-CM | POA: Diagnosis not present

## 2024-05-04 DIAGNOSIS — E1169 Type 2 diabetes mellitus with other specified complication: Secondary | ICD-10-CM | POA: Diagnosis not present

## 2024-05-04 DIAGNOSIS — Z8781 Personal history of (healed) traumatic fracture: Secondary | ICD-10-CM | POA: Diagnosis not present

## 2024-05-04 DIAGNOSIS — F32A Depression, unspecified: Secondary | ICD-10-CM | POA: Diagnosis not present

## 2024-05-04 DIAGNOSIS — E669 Obesity, unspecified: Secondary | ICD-10-CM | POA: Diagnosis not present

## 2024-05-04 DIAGNOSIS — F02A3 Dementia in other diseases classified elsewhere, mild, with mood disturbance: Secondary | ICD-10-CM | POA: Diagnosis not present

## 2024-05-04 DIAGNOSIS — E782 Mixed hyperlipidemia: Secondary | ICD-10-CM | POA: Diagnosis not present

## 2024-05-04 DIAGNOSIS — S72011D Unspecified intracapsular fracture of right femur, subsequent encounter for closed fracture with routine healing: Secondary | ICD-10-CM | POA: Diagnosis not present

## 2024-05-04 DIAGNOSIS — B356 Tinea cruris: Secondary | ICD-10-CM | POA: Diagnosis not present

## 2024-05-04 DIAGNOSIS — F02A4 Dementia in other diseases classified elsewhere, mild, with anxiety: Secondary | ICD-10-CM | POA: Diagnosis not present

## 2024-05-04 DIAGNOSIS — M810 Age-related osteoporosis without current pathological fracture: Secondary | ICD-10-CM | POA: Diagnosis not present

## 2024-05-05 DIAGNOSIS — E1169 Type 2 diabetes mellitus with other specified complication: Secondary | ICD-10-CM | POA: Diagnosis not present

## 2024-05-05 DIAGNOSIS — E669 Obesity, unspecified: Secondary | ICD-10-CM | POA: Diagnosis not present

## 2024-05-05 DIAGNOSIS — F02A3 Dementia in other diseases classified elsewhere, mild, with mood disturbance: Secondary | ICD-10-CM | POA: Diagnosis not present

## 2024-05-05 DIAGNOSIS — F02A4 Dementia in other diseases classified elsewhere, mild, with anxiety: Secondary | ICD-10-CM | POA: Diagnosis not present

## 2024-05-05 DIAGNOSIS — E782 Mixed hyperlipidemia: Secondary | ICD-10-CM | POA: Diagnosis not present

## 2024-05-05 DIAGNOSIS — F32A Depression, unspecified: Secondary | ICD-10-CM | POA: Diagnosis not present

## 2024-05-05 DIAGNOSIS — S72011D Unspecified intracapsular fracture of right femur, subsequent encounter for closed fracture with routine healing: Secondary | ICD-10-CM | POA: Diagnosis not present

## 2024-05-05 DIAGNOSIS — I482 Chronic atrial fibrillation, unspecified: Secondary | ICD-10-CM | POA: Diagnosis not present

## 2024-05-05 DIAGNOSIS — E039 Hypothyroidism, unspecified: Secondary | ICD-10-CM | POA: Diagnosis not present

## 2024-05-06 DIAGNOSIS — C44319 Basal cell carcinoma of skin of other parts of face: Secondary | ICD-10-CM | POA: Diagnosis not present

## 2024-05-06 DIAGNOSIS — L814 Other melanin hyperpigmentation: Secondary | ICD-10-CM | POA: Diagnosis not present

## 2024-05-06 DIAGNOSIS — D485 Neoplasm of uncertain behavior of skin: Secondary | ICD-10-CM | POA: Diagnosis not present

## 2024-05-07 DIAGNOSIS — F02A3 Dementia in other diseases classified elsewhere, mild, with mood disturbance: Secondary | ICD-10-CM | POA: Diagnosis not present

## 2024-05-07 DIAGNOSIS — F32A Depression, unspecified: Secondary | ICD-10-CM | POA: Diagnosis not present

## 2024-05-07 DIAGNOSIS — E782 Mixed hyperlipidemia: Secondary | ICD-10-CM | POA: Diagnosis not present

## 2024-05-07 DIAGNOSIS — E039 Hypothyroidism, unspecified: Secondary | ICD-10-CM | POA: Diagnosis not present

## 2024-05-07 DIAGNOSIS — E669 Obesity, unspecified: Secondary | ICD-10-CM | POA: Diagnosis not present

## 2024-05-07 DIAGNOSIS — S72011D Unspecified intracapsular fracture of right femur, subsequent encounter for closed fracture with routine healing: Secondary | ICD-10-CM | POA: Diagnosis not present

## 2024-05-07 DIAGNOSIS — F02A4 Dementia in other diseases classified elsewhere, mild, with anxiety: Secondary | ICD-10-CM | POA: Diagnosis not present

## 2024-05-07 DIAGNOSIS — E1169 Type 2 diabetes mellitus with other specified complication: Secondary | ICD-10-CM | POA: Diagnosis not present

## 2024-05-07 DIAGNOSIS — I482 Chronic atrial fibrillation, unspecified: Secondary | ICD-10-CM | POA: Diagnosis not present

## 2024-05-26 DIAGNOSIS — F32A Depression, unspecified: Secondary | ICD-10-CM | POA: Diagnosis not present

## 2024-05-26 DIAGNOSIS — S72011D Unspecified intracapsular fracture of right femur, subsequent encounter for closed fracture with routine healing: Secondary | ICD-10-CM | POA: Diagnosis not present

## 2024-05-26 DIAGNOSIS — E669 Obesity, unspecified: Secondary | ICD-10-CM | POA: Diagnosis not present

## 2024-05-26 DIAGNOSIS — F02A4 Dementia in other diseases classified elsewhere, mild, with anxiety: Secondary | ICD-10-CM | POA: Diagnosis not present

## 2024-05-26 DIAGNOSIS — E1169 Type 2 diabetes mellitus with other specified complication: Secondary | ICD-10-CM | POA: Diagnosis not present

## 2024-05-26 DIAGNOSIS — E782 Mixed hyperlipidemia: Secondary | ICD-10-CM | POA: Diagnosis not present

## 2024-05-26 DIAGNOSIS — F02A3 Dementia in other diseases classified elsewhere, mild, with mood disturbance: Secondary | ICD-10-CM | POA: Diagnosis not present

## 2024-05-26 DIAGNOSIS — I482 Chronic atrial fibrillation, unspecified: Secondary | ICD-10-CM | POA: Diagnosis not present

## 2024-05-26 DIAGNOSIS — E039 Hypothyroidism, unspecified: Secondary | ICD-10-CM | POA: Diagnosis not present

## 2024-05-27 DIAGNOSIS — E782 Mixed hyperlipidemia: Secondary | ICD-10-CM | POA: Diagnosis not present

## 2024-05-27 DIAGNOSIS — S72011D Unspecified intracapsular fracture of right femur, subsequent encounter for closed fracture with routine healing: Secondary | ICD-10-CM | POA: Diagnosis not present

## 2024-05-27 DIAGNOSIS — F02A4 Dementia in other diseases classified elsewhere, mild, with anxiety: Secondary | ICD-10-CM | POA: Diagnosis not present

## 2024-05-27 DIAGNOSIS — E039 Hypothyroidism, unspecified: Secondary | ICD-10-CM | POA: Diagnosis not present

## 2024-05-27 DIAGNOSIS — E669 Obesity, unspecified: Secondary | ICD-10-CM | POA: Diagnosis not present

## 2024-05-27 DIAGNOSIS — F32A Depression, unspecified: Secondary | ICD-10-CM | POA: Diagnosis not present

## 2024-05-27 DIAGNOSIS — E1169 Type 2 diabetes mellitus with other specified complication: Secondary | ICD-10-CM | POA: Diagnosis not present

## 2024-05-27 DIAGNOSIS — I482 Chronic atrial fibrillation, unspecified: Secondary | ICD-10-CM | POA: Diagnosis not present

## 2024-05-27 DIAGNOSIS — F02A3 Dementia in other diseases classified elsewhere, mild, with mood disturbance: Secondary | ICD-10-CM | POA: Diagnosis not present

## 2024-06-01 DIAGNOSIS — F02A3 Dementia in other diseases classified elsewhere, mild, with mood disturbance: Secondary | ICD-10-CM | POA: Diagnosis not present

## 2024-06-01 DIAGNOSIS — E782 Mixed hyperlipidemia: Secondary | ICD-10-CM | POA: Diagnosis not present

## 2024-06-01 DIAGNOSIS — E1169 Type 2 diabetes mellitus with other specified complication: Secondary | ICD-10-CM | POA: Diagnosis not present

## 2024-06-01 DIAGNOSIS — F02A4 Dementia in other diseases classified elsewhere, mild, with anxiety: Secondary | ICD-10-CM | POA: Diagnosis not present

## 2024-06-01 DIAGNOSIS — E669 Obesity, unspecified: Secondary | ICD-10-CM | POA: Diagnosis not present

## 2024-06-01 DIAGNOSIS — F32A Depression, unspecified: Secondary | ICD-10-CM | POA: Diagnosis not present

## 2024-06-01 DIAGNOSIS — S72011D Unspecified intracapsular fracture of right femur, subsequent encounter for closed fracture with routine healing: Secondary | ICD-10-CM | POA: Diagnosis not present

## 2024-06-01 DIAGNOSIS — I482 Chronic atrial fibrillation, unspecified: Secondary | ICD-10-CM | POA: Diagnosis not present

## 2024-06-01 DIAGNOSIS — E039 Hypothyroidism, unspecified: Secondary | ICD-10-CM | POA: Diagnosis not present

## 2024-06-02 DIAGNOSIS — S72001D Fracture of unspecified part of neck of right femur, subsequent encounter for closed fracture with routine healing: Secondary | ICD-10-CM | POA: Diagnosis not present

## 2024-06-02 DIAGNOSIS — Z8781 Personal history of (healed) traumatic fracture: Secondary | ICD-10-CM | POA: Diagnosis not present

## 2024-06-03 DIAGNOSIS — H34831 Tributary (branch) retinal vein occlusion, right eye, with macular edema: Secondary | ICD-10-CM | POA: Diagnosis not present

## 2024-07-22 DIAGNOSIS — H34831 Tributary (branch) retinal vein occlusion, right eye, with macular edema: Secondary | ICD-10-CM | POA: Diagnosis not present

## 2024-07-22 DIAGNOSIS — E1169 Type 2 diabetes mellitus with other specified complication: Secondary | ICD-10-CM | POA: Diagnosis not present

## 2024-07-22 DIAGNOSIS — E782 Mixed hyperlipidemia: Secondary | ICD-10-CM | POA: Diagnosis not present

## 2024-08-06 DIAGNOSIS — Z0000 Encounter for general adult medical examination without abnormal findings: Secondary | ICD-10-CM | POA: Diagnosis not present

## 2024-08-06 DIAGNOSIS — E119 Type 2 diabetes mellitus without complications: Secondary | ICD-10-CM | POA: Diagnosis not present

## 2024-08-06 DIAGNOSIS — S72001D Fracture of unspecified part of neck of right femur, subsequent encounter for closed fracture with routine healing: Secondary | ICD-10-CM | POA: Diagnosis not present

## 2024-08-06 DIAGNOSIS — Z8781 Personal history of (healed) traumatic fracture: Secondary | ICD-10-CM | POA: Diagnosis not present

## 2024-08-06 DIAGNOSIS — F32A Depression, unspecified: Secondary | ICD-10-CM | POA: Diagnosis not present

## 2024-09-02 DIAGNOSIS — H34831 Tributary (branch) retinal vein occlusion, right eye, with macular edema: Secondary | ICD-10-CM | POA: Diagnosis not present

## 2024-09-18 DIAGNOSIS — C44319 Basal cell carcinoma of skin of other parts of face: Secondary | ICD-10-CM | POA: Diagnosis not present

## 2024-10-09 DIAGNOSIS — R441 Visual hallucinations: Secondary | ICD-10-CM | POA: Diagnosis not present

## 2024-10-09 DIAGNOSIS — R413 Other amnesia: Secondary | ICD-10-CM | POA: Diagnosis not present

## 2024-10-09 DIAGNOSIS — R4182 Altered mental status, unspecified: Secondary | ICD-10-CM | POA: Diagnosis not present

## 2024-10-13 DIAGNOSIS — E1169 Type 2 diabetes mellitus with other specified complication: Secondary | ICD-10-CM | POA: Diagnosis not present

## 2024-10-13 DIAGNOSIS — F5101 Primary insomnia: Secondary | ICD-10-CM | POA: Diagnosis not present

## 2024-10-13 DIAGNOSIS — R269 Unspecified abnormalities of gait and mobility: Secondary | ICD-10-CM | POA: Diagnosis not present

## 2024-10-13 DIAGNOSIS — G252 Other specified forms of tremor: Secondary | ICD-10-CM | POA: Diagnosis not present

## 2024-10-13 DIAGNOSIS — H547 Unspecified visual loss: Secondary | ICD-10-CM | POA: Diagnosis not present

## 2024-10-13 DIAGNOSIS — N1831 Chronic kidney disease, stage 3a: Secondary | ICD-10-CM | POA: Diagnosis not present

## 2024-10-13 DIAGNOSIS — R413 Other amnesia: Secondary | ICD-10-CM | POA: Diagnosis not present
